# Patient Record
Sex: Female | Born: 1945 | Race: White | Hispanic: No | Smoking: Never smoker
Health system: Southern US, Community
[De-identification: ages and names within clinical notes are randomized; demographics above are authoritative.]

## PROBLEM LIST (undated history)

## (undated) DIAGNOSIS — R51 Headache: Secondary | ICD-10-CM

## (undated) DIAGNOSIS — Z8619 Personal history of other infectious and parasitic diseases: Secondary | ICD-10-CM

## (undated) DIAGNOSIS — E538 Deficiency of other specified B group vitamins: Secondary | ICD-10-CM

## (undated) DIAGNOSIS — E109 Type 1 diabetes mellitus without complications: Secondary | ICD-10-CM

## (undated) DIAGNOSIS — E039 Hypothyroidism, unspecified: Secondary | ICD-10-CM

## (undated) DIAGNOSIS — R569 Unspecified convulsions: Secondary | ICD-10-CM

## (undated) DIAGNOSIS — R519 Headache, unspecified: Secondary | ICD-10-CM

## (undated) DIAGNOSIS — D649 Anemia, unspecified: Secondary | ICD-10-CM

## (undated) HISTORY — DX: Unspecified convulsions: R56.9

## (undated) HISTORY — DX: Headache, unspecified: R51.9

## (undated) HISTORY — DX: Headache: R51

## (undated) HISTORY — DX: Anemia, unspecified: D64.9

## (undated) HISTORY — DX: Deficiency of other specified B group vitamins: E53.8

## (undated) HISTORY — DX: Type 1 diabetes mellitus without complications: E10.9

## (undated) HISTORY — DX: Personal history of other infectious and parasitic diseases: Z86.19

## (undated) HISTORY — DX: Hypothyroidism, unspecified: E03.9

---

## 1997-10-25 ENCOUNTER — Encounter: Admission: RE | Admit: 1997-10-25 | Discharge: 1998-01-23 | Payer: Self-pay | Admitting: Internal Medicine

## 2001-01-23 ENCOUNTER — Encounter: Admission: RE | Admit: 2001-01-23 | Discharge: 2001-04-23 | Payer: Self-pay | Admitting: Internal Medicine

## 2004-08-06 ENCOUNTER — Emergency Department (HOSPITAL_COMMUNITY): Admission: EM | Admit: 2004-08-06 | Discharge: 2004-08-06 | Payer: Self-pay | Admitting: Family Medicine

## 2005-03-10 ENCOUNTER — Ambulatory Visit: Payer: Self-pay | Admitting: Family Medicine

## 2005-04-21 ENCOUNTER — Ambulatory Visit: Payer: Self-pay | Admitting: Family Medicine

## 2005-04-23 ENCOUNTER — Encounter: Admission: RE | Admit: 2005-04-23 | Discharge: 2005-04-23 | Payer: Self-pay | Admitting: Family Medicine

## 2005-05-18 ENCOUNTER — Ambulatory Visit: Payer: Self-pay | Admitting: Gastroenterology

## 2005-05-18 ENCOUNTER — Ambulatory Visit (HOSPITAL_COMMUNITY): Admission: RE | Admit: 2005-05-18 | Discharge: 2005-05-18 | Payer: Self-pay | Admitting: Gastroenterology

## 2005-06-01 ENCOUNTER — Ambulatory Visit: Payer: Self-pay | Admitting: Family Medicine

## 2005-06-22 ENCOUNTER — Ambulatory Visit: Payer: Self-pay | Admitting: Gastroenterology

## 2005-06-23 ENCOUNTER — Ambulatory Visit: Payer: Self-pay | Admitting: Gastroenterology

## 2005-07-05 ENCOUNTER — Ambulatory Visit: Payer: Self-pay | Admitting: Cardiology

## 2005-07-13 ENCOUNTER — Encounter (INDEPENDENT_AMBULATORY_CARE_PROVIDER_SITE_OTHER): Payer: Self-pay | Admitting: *Deleted

## 2005-07-13 ENCOUNTER — Encounter: Payer: Self-pay | Admitting: Family Medicine

## 2005-07-13 ENCOUNTER — Ambulatory Visit: Payer: Self-pay | Admitting: Gastroenterology

## 2005-07-30 ENCOUNTER — Ambulatory Visit: Payer: Self-pay | Admitting: Family Medicine

## 2005-12-18 ENCOUNTER — Emergency Department (HOSPITAL_COMMUNITY): Admission: EM | Admit: 2005-12-18 | Discharge: 2005-12-18 | Payer: Self-pay | Admitting: Emergency Medicine

## 2005-12-19 ENCOUNTER — Ambulatory Visit: Payer: Self-pay | Admitting: Internal Medicine

## 2005-12-19 ENCOUNTER — Inpatient Hospital Stay (HOSPITAL_COMMUNITY): Admission: EM | Admit: 2005-12-19 | Discharge: 2005-12-24 | Payer: Self-pay | Admitting: Emergency Medicine

## 2005-12-20 ENCOUNTER — Encounter: Payer: Self-pay | Admitting: Internal Medicine

## 2006-03-10 ENCOUNTER — Ambulatory Visit: Payer: Self-pay | Admitting: Internal Medicine

## 2006-08-27 ENCOUNTER — Inpatient Hospital Stay (HOSPITAL_COMMUNITY): Admission: EM | Admit: 2006-08-27 | Discharge: 2006-09-01 | Payer: Self-pay | Admitting: Emergency Medicine

## 2006-08-29 ENCOUNTER — Ambulatory Visit: Payer: Self-pay | Admitting: Internal Medicine

## 2006-09-23 ENCOUNTER — Inpatient Hospital Stay (HOSPITAL_COMMUNITY): Admission: AD | Admit: 2006-09-23 | Discharge: 2006-09-26 | Payer: Self-pay | Admitting: Internal Medicine

## 2006-09-23 ENCOUNTER — Ambulatory Visit: Payer: Self-pay | Admitting: Internal Medicine

## 2007-03-16 ENCOUNTER — Telehealth: Payer: Self-pay | Admitting: *Deleted

## 2007-03-22 ENCOUNTER — Ambulatory Visit: Payer: Self-pay | Admitting: Internal Medicine

## 2007-03-22 DIAGNOSIS — E782 Mixed hyperlipidemia: Secondary | ICD-10-CM | POA: Insufficient documentation

## 2007-03-22 DIAGNOSIS — E039 Hypothyroidism, unspecified: Secondary | ICD-10-CM | POA: Insufficient documentation

## 2007-03-22 LAB — CONVERTED CEMR LAB: Blood Glucose, Fingerstick: 225

## 2007-06-13 ENCOUNTER — Telehealth: Payer: Self-pay | Admitting: Internal Medicine

## 2007-08-15 ENCOUNTER — Ambulatory Visit: Payer: Self-pay | Admitting: Internal Medicine

## 2007-08-15 LAB — CONVERTED CEMR LAB
Cholesterol: 175 mg/dL (ref 0–200)
HDL: 53.1 mg/dL (ref >39.0)
LDL Cholesterol: 102 mg/dL — ABNORMAL HIGH (ref 0–99)
TSH: 0.02 microintl units/mL — ABNORMAL LOW (ref 0.35–5.50)
Total CHOL/HDL Ratio: 3.3
Triglycerides: 102 mg/dL (ref 0–149)
VLDL: 20 mg/dL (ref 0–40)

## 2007-08-25 ENCOUNTER — Ambulatory Visit: Payer: Self-pay | Admitting: Internal Medicine

## 2007-08-31 ENCOUNTER — Encounter: Payer: Self-pay | Admitting: Endocrinology

## 2007-09-11 ENCOUNTER — Encounter: Admission: RE | Admit: 2007-09-11 | Discharge: 2007-12-10 | Payer: Self-pay | Admitting: Internal Medicine

## 2007-09-11 ENCOUNTER — Encounter: Payer: Self-pay | Admitting: Internal Medicine

## 2007-10-31 ENCOUNTER — Ambulatory Visit: Payer: Self-pay | Admitting: Internal Medicine

## 2007-11-01 ENCOUNTER — Telehealth: Payer: Self-pay | Admitting: Internal Medicine

## 2007-11-01 LAB — CONVERTED CEMR LAB
Creatinine,U: 77.8 mg/dL
Hgb A1c MFr Bld: 11.4 % — ABNORMAL HIGH (ref 4.6–6.0)
Microalb Creat Ratio: 5.1 mg/g (ref 0.0–30.0)
Microalb, Ur: 0.4 mg/dL (ref 0.0–1.9)
TSH: 1.31 microintl units/mL (ref 0.35–5.50)

## 2007-11-23 ENCOUNTER — Ambulatory Visit: Payer: Self-pay | Admitting: Endocrinology

## 2008-03-01 ENCOUNTER — Ambulatory Visit: Payer: Self-pay | Admitting: Internal Medicine

## 2008-03-01 DIAGNOSIS — J111 Influenza due to unidentified influenza virus with other respiratory manifestations: Secondary | ICD-10-CM | POA: Insufficient documentation

## 2008-03-08 ENCOUNTER — Telehealth: Payer: Self-pay | Admitting: *Deleted

## 2008-04-22 ENCOUNTER — Ambulatory Visit: Payer: Self-pay | Admitting: Internal Medicine

## 2008-04-22 ENCOUNTER — Inpatient Hospital Stay (HOSPITAL_COMMUNITY): Admission: EM | Admit: 2008-04-22 | Discharge: 2008-04-25 | Payer: Self-pay | Admitting: Emergency Medicine

## 2008-04-22 ENCOUNTER — Telehealth: Payer: Self-pay | Admitting: Internal Medicine

## 2008-04-25 ENCOUNTER — Encounter: Payer: Self-pay | Admitting: Internal Medicine

## 2008-05-07 ENCOUNTER — Telehealth: Payer: Self-pay | Admitting: *Deleted

## 2008-05-15 ENCOUNTER — Ambulatory Visit: Payer: Self-pay | Admitting: Internal Medicine

## 2008-05-15 DIAGNOSIS — K2990 Gastroduodenitis, unspecified, without bleeding: Secondary | ICD-10-CM

## 2008-05-15 DIAGNOSIS — K2970 Gastritis, unspecified, without bleeding: Secondary | ICD-10-CM | POA: Insufficient documentation

## 2008-05-15 LAB — CONVERTED CEMR LAB
Blood in Urine, dipstick: NEGATIVE
Glucose, Urine, Semiquant: 100
Ketones, urine, test strip: NEGATIVE
Nitrite: NEGATIVE
Protein, U semiquant: NEGATIVE
Specific Gravity, Urine: 1.015
Urobilinogen, UA: 0.2
pH: 5

## 2008-05-16 ENCOUNTER — Ambulatory Visit: Payer: Self-pay | Admitting: Internal Medicine

## 2008-05-17 ENCOUNTER — Encounter: Payer: Self-pay | Admitting: Internal Medicine

## 2008-09-10 ENCOUNTER — Telehealth: Payer: Self-pay | Admitting: *Deleted

## 2008-10-22 ENCOUNTER — Ambulatory Visit: Payer: Self-pay | Admitting: Internal Medicine

## 2008-10-22 LAB — CONVERTED CEMR LAB: Blood Glucose, Fingerstick: 408

## 2008-10-25 ENCOUNTER — Telehealth (INDEPENDENT_AMBULATORY_CARE_PROVIDER_SITE_OTHER): Payer: Self-pay | Admitting: *Deleted

## 2008-12-24 ENCOUNTER — Ambulatory Visit: Payer: Self-pay | Admitting: Internal Medicine

## 2008-12-27 LAB — CONVERTED CEMR LAB
ALT: 23 units/L (ref 0–35)
AST: 29 units/L (ref 0–37)
Albumin: 4 g/dL (ref 3.5–5.2)
Alkaline Phosphatase: 109 units/L (ref 39–117)
BUN: 17 mg/dL (ref 6–23)
Basophils Absolute: 0 10*3/uL (ref 0.0–0.1)
Basophils Relative: 0.4 % (ref 0.0–3.0)
Bilirubin, Direct: 0 mg/dL (ref 0.0–0.3)
CO2: 30 meq/L (ref 19–32)
Calcium: 9.7 mg/dL (ref 8.4–10.5)
Chloride: 98 meq/L (ref 96–112)
Cholesterol: 255 mg/dL — ABNORMAL HIGH (ref 0–200)
Creatinine, Ser: 0.9 mg/dL (ref 0.4–1.2)
Creatinine,U: 44.5 mg/dL
Direct LDL: 171.1 mg/dL
Eosinophils Absolute: 0.3 10*3/uL (ref 0.0–0.7)
Eosinophils Relative: 3.6 % (ref 0.0–5.0)
GFR calc non Af Amer: 67.46 mL/min (ref >60)
Glucose, Bld: 105 mg/dL — ABNORMAL HIGH (ref 70–99)
HCT: 39.5 % (ref 36.0–46.0)
HDL: 69.1 mg/dL (ref >39.00)
Hemoglobin: 13.6 g/dL (ref 12.0–15.0)
Hgb A1c MFr Bld: 11.5 % — ABNORMAL HIGH (ref 4.6–6.5)
Lymphocytes Relative: 40.1 % (ref 12.0–46.0)
Lymphs Abs: 2.8 10*3/uL (ref 0.7–4.0)
MCHC: 34.4 g/dL (ref 30.0–36.0)
MCV: 88.8 fL (ref 78.0–100.0)
Microalb Creat Ratio: 2.2 mg/g (ref 0.0–30.0)
Microalb, Ur: 0.1 mg/dL (ref 0.0–1.9)
Monocytes Absolute: 0.5 10*3/uL (ref 0.1–1.0)
Monocytes Relative: 6.9 % (ref 3.0–12.0)
Neutro Abs: 3.4 10*3/uL (ref 1.4–7.7)
Neutrophils Relative %: 49 % (ref 43.0–77.0)
Platelets: 215 10*3/uL (ref 150.0–400.0)
Potassium: 4.3 meq/L (ref 3.5–5.1)
RBC: 4.45 M/uL (ref 3.87–5.11)
RDW: 12.5 % (ref 11.5–14.6)
Sodium: 135 meq/L (ref 135–145)
TSH: 15.36 microintl units/mL — ABNORMAL HIGH (ref 0.35–5.50)
Total Bilirubin: 1 mg/dL (ref 0.3–1.2)
Total CHOL/HDL Ratio: 4
Total Protein: 7.4 g/dL (ref 6.0–8.3)
Triglycerides: 73 mg/dL (ref 0.0–149.0)
VLDL: 14.6 mg/dL (ref 0.0–40.0)
WBC: 7 10*3/uL (ref 4.5–10.5)

## 2008-12-31 ENCOUNTER — Ambulatory Visit: Payer: Self-pay | Admitting: Internal Medicine

## 2009-02-07 ENCOUNTER — Ambulatory Visit: Payer: Self-pay | Admitting: Endocrinology

## 2009-02-20 ENCOUNTER — Encounter: Payer: Self-pay | Admitting: Internal Medicine

## 2009-02-21 ENCOUNTER — Ambulatory Visit: Payer: Self-pay | Admitting: Endocrinology

## 2009-02-21 LAB — CONVERTED CEMR LAB
BUN: 15 mg/dL (ref 6–23)
CO2: 31 meq/L (ref 19–32)
Chloride: 100 meq/L (ref 96–112)
GFR calc non Af Amer: 77.24 mL/min (ref >60)
Glucose, Bld: 147 mg/dL — ABNORMAL HIGH (ref 70–99)
Potassium: 4.7 meq/L (ref 3.5–5.1)
Sodium: 138 meq/L (ref 135–145)

## 2009-03-04 ENCOUNTER — Ambulatory Visit: Payer: Self-pay | Admitting: Internal Medicine

## 2009-03-07 ENCOUNTER — Telehealth (INDEPENDENT_AMBULATORY_CARE_PROVIDER_SITE_OTHER): Payer: Self-pay | Admitting: *Deleted

## 2009-03-08 ENCOUNTER — Encounter: Payer: Self-pay | Admitting: Endocrinology

## 2009-03-10 ENCOUNTER — Encounter: Payer: Self-pay | Admitting: Endocrinology

## 2009-03-10 LAB — CONVERTED CEMR LAB
Cholesterol: 235 mg/dL — ABNORMAL HIGH (ref 0–200)
HDL: 54.2 mg/dL (ref >39.00)
Total CHOL/HDL Ratio: 4
Triglycerides: 94 mg/dL (ref 0.0–149.0)
VLDL: 18.8 mg/dL (ref 0.0–40.0)

## 2009-03-17 ENCOUNTER — Encounter: Payer: Self-pay | Admitting: Endocrinology

## 2009-04-03 ENCOUNTER — Telehealth: Payer: Self-pay | Admitting: Endocrinology

## 2009-04-04 ENCOUNTER — Encounter: Payer: Self-pay | Admitting: Endocrinology

## 2009-04-28 ENCOUNTER — Encounter: Payer: Self-pay | Admitting: Endocrinology

## 2009-05-01 ENCOUNTER — Ambulatory Visit: Payer: Self-pay | Admitting: Endocrinology

## 2009-05-09 ENCOUNTER — Ambulatory Visit: Payer: Self-pay | Admitting: Endocrinology

## 2009-08-12 ENCOUNTER — Ambulatory Visit: Payer: Self-pay | Admitting: Internal Medicine

## 2009-08-12 DIAGNOSIS — B9789 Other viral agents as the cause of diseases classified elsewhere: Secondary | ICD-10-CM

## 2009-08-12 DIAGNOSIS — R112 Nausea with vomiting, unspecified: Secondary | ICD-10-CM

## 2009-08-12 LAB — CONVERTED CEMR LAB
Blood in Urine, dipstick: NEGATIVE
Urobilinogen, UA: 0.2
pH: 5.5

## 2009-08-13 ENCOUNTER — Telehealth: Payer: Self-pay | Admitting: *Deleted

## 2009-08-13 LAB — CONVERTED CEMR LAB
Albumin: 3.9 g/dL (ref 3.5–5.2)
Basophils Relative: 0.7 % (ref 0.0–3.0)
CO2: 29 meq/L (ref 19–32)
Chloride: 107 meq/L (ref 96–112)
Creatinine, Ser: 0.8 mg/dL (ref 0.4–1.2)
Eosinophils Absolute: 0.2 10*3/uL (ref 0.0–0.7)
Glucose, Bld: 114 mg/dL — ABNORMAL HIGH (ref 70–99)
Hemoglobin: 12.7 g/dL (ref 12.0–15.0)
MCHC: 33.1 g/dL (ref 30.0–36.0)
MCV: 87.2 fL (ref 78.0–100.0)
Monocytes Absolute: 0.5 10*3/uL (ref 0.1–1.0)
Neutro Abs: 2.1 10*3/uL (ref 1.4–7.7)
RBC: 4.41 M/uL (ref 3.87–5.11)
Total CK: 152 units/L (ref 7–177)
Total Protein: 7.2 g/dL (ref 6.0–8.3)

## 2009-08-14 ENCOUNTER — Telehealth: Payer: Self-pay | Admitting: Family Medicine

## 2009-08-14 ENCOUNTER — Ambulatory Visit: Payer: Self-pay | Admitting: Internal Medicine

## 2009-08-15 ENCOUNTER — Encounter: Admission: RE | Admit: 2009-08-15 | Discharge: 2009-08-15 | Payer: Self-pay | Admitting: Internal Medicine

## 2009-08-15 ENCOUNTER — Encounter: Payer: Self-pay | Admitting: Internal Medicine

## 2009-08-18 ENCOUNTER — Telehealth: Payer: Self-pay | Admitting: *Deleted

## 2009-08-26 ENCOUNTER — Encounter: Payer: Self-pay | Admitting: Internal Medicine

## 2009-10-13 ENCOUNTER — Ambulatory Visit: Payer: Self-pay | Admitting: Endocrinology

## 2010-01-02 ENCOUNTER — Emergency Department (HOSPITAL_COMMUNITY): Admission: EM | Admit: 2010-01-02 | Discharge: 2010-01-02 | Payer: Self-pay | Admitting: Emergency Medicine

## 2010-01-02 ENCOUNTER — Ambulatory Visit: Payer: Self-pay | Admitting: Internal Medicine

## 2010-01-02 DIAGNOSIS — E1110 Type 2 diabetes mellitus with ketoacidosis without coma: Secondary | ICD-10-CM

## 2010-01-02 DIAGNOSIS — M7989 Other specified soft tissue disorders: Secondary | ICD-10-CM

## 2010-01-02 DIAGNOSIS — IMO0001 Reserved for inherently not codable concepts without codable children: Secondary | ICD-10-CM

## 2010-01-02 LAB — CONVERTED CEMR LAB
Blood Glucose, AC Bkfst: 335 mg/dL
Glucose, Urine, Semiquant: >=1000
Specific Gravity, Urine: 1.015
WBC Urine, dipstick: NEGATIVE
pH: 5

## 2010-01-03 ENCOUNTER — Encounter: Payer: Self-pay | Admitting: Internal Medicine

## 2010-01-03 ENCOUNTER — Inpatient Hospital Stay (HOSPITAL_COMMUNITY): Admission: EM | Admit: 2010-01-03 | Discharge: 2010-01-07 | Payer: Self-pay | Admitting: Emergency Medicine

## 2010-01-08 ENCOUNTER — Telehealth: Payer: Self-pay | Admitting: *Deleted

## 2010-03-31 ENCOUNTER — Ambulatory Visit (HOSPITAL_COMMUNITY)
Admission: RE | Admit: 2010-03-31 | Discharge: 2010-03-31 | Payer: Self-pay | Source: Home / Self Care | Admitting: Internal Medicine

## 2010-05-01 ENCOUNTER — Telehealth (INDEPENDENT_AMBULATORY_CARE_PROVIDER_SITE_OTHER): Payer: Self-pay | Admitting: *Deleted

## 2010-06-03 ENCOUNTER — Encounter: Payer: Self-pay | Admitting: Endocrinology

## 2010-07-02 NOTE — Progress Notes (Signed)
  Phone Note Other Incoming   Request: Send information Summary of Call: Request received from [facility] forwarded to [facility].  Marland Kitchen

## 2010-07-02 NOTE — Progress Notes (Signed)
  Phone Note From Other Clinic   Caller: [place] Summary of Call: Call from [facility] CT.  Pt has recent head trauma.  1cm ring density L cerebellum with DDX contusion vs mass vs hemorrhage.  Spoke with patient and no further nausea or vomiting.  Headaches are relatively mild and intermittent.  No confusion or seizure.  MRI recommended for further assessement with contrast.  Pt is instructed to hold ASA until further assessed.  She does still have some dizziness but no progressive symptoms.  Pt knows to f/u immediately or go to ER if she has any progressive headache, vomiting, speech difficullty, seizure, confusion, or any new focal neurologic symptoms. Initial call taken by: Evelena Peat MD,  August 14, 2009 5:43 PM     Appended Document:  MRI head  to be scheduled.

## 2010-07-02 NOTE — Progress Notes (Signed)
Summary: Dizzy  Phone Note Call from Patient Call back at Home Phone [phone]   Caller: Patient Summary of Call: Pt states she is real dizzy from hitting her head. Other than that she is fine. Initial call taken by: Romualdo Bolk, CMA (AAMA),  August 13, 2009 9:42 AM  Follow-up for Phone Call        if not improving  would do Ct scan of head .  Follow-up by: Madelin Headings MD,  August 13, 2009 9:44 AM  Additional Follow-up for Phone Call Additional follow up Details #1::        Pt aware and wants to go ahead with CT. Order sent to [facility]. Additional Follow-up by: Romualdo Bolk, CMA (AAMA),  August 13, 2009 11:48 AM

## 2010-07-02 NOTE — Letter (Signed)
Summary: [facility]   Imported By: Maryln Gottron 01/20/2010 14:41:35  _____________________________________________________________________  External Attachment:    Type:   Image     Comment:   External Document

## 2010-07-02 NOTE — Assessment & Plan Note (Signed)
Summary: FU---STC   Vital Signs:  Patient profile:   65 year old female Menstrual status:  postmenopausal Height:      62 inches (157.48 cm) Weight:      141.38 pounds (64.26 kg) O2 Sat:      98 % on Room air Temp:     98.1 degrees F (36.72 degrees C) oral Pulse rate:   83 / minute BP sitting:   110 / 70  (left arm) Cuff size:   regular  Vitals Entered By: Josph Macho RMA (Oct 13, 2009 1:18 PM)  O2 Flow:  Room air CC: Follow-up visit/ CF Is Patient Diabetic? Yes   Referring Provider:  Dr Berniece Andreas Primary Provider:  Dr Berniece Andreas  CC:  Follow-up visit/ CF.  History of Present Illness: pt states she feels well in general.  she has resumed her pump therapy x 1 month.  she has frequent hypoglgycemia before breakfast.  it is highest after lunch (200's).  she averages approx 30 units humalog per day, via her pump. she was seen in er for head injury after she fell out of bed.   Current Medications (verified): 1)  Onetouch Test   Strp (Glucose Blood) .... Use Qid 2)  Novolog 100 Unit/ml Soln (Insulin Aspart) .... For Use in Insulin Pump, Total of 45 Units Per Day 3)  Bd Ultra-Fine Pen Needles 29g X 12.17mm  Misc (Insulin Pen Needle) .... Use As Directed 4)  Baby Aspirin 81 Mg  Chew (Aspirin) .... Once Daily 5)  Imodium Advanced 2-125 Mg Tabs (Loperamide-Simethicone) 6)  Levothroid 137 Mcg Tabs (Levothyroxine Sodium) .Marland Kitchen.. 1 By Mouth Once Daily 7)  Simvastatin 80 Mg Tabs (Simvastatin) .... 1/2 Qhs 8)  Ondansetron Hcl 4 Mg Tabs (Ondansetron Hcl) .Marland Kitchen.. 1 By Mouth Q4-6 Hours As Needed Nausea and Vomiting  Allergies (verified): No Known Drug Allergies  Past History:  Past Medical History: Last updated: 05/15/2008 Diabetes mellitus, type   dx in her 40's Hypothyroidism  g4p3 Hosp NVD ?uti DM  Fever  Dr Jennette Kettle is GYNE    Review of Systems       other than the above, no syncope.  Physical Exam  General:  normal appearance.   Pulses:  dorsalis pedis intact bilat.      Extremities:  no deformity.  no ulcer on the feet.  feet are of normal color and temp.  no edema  Neurologic:  sensation is intact to touch on the feet    Impression & Recommendations:  Problem # 1:  DIABETES MELLITUS, TYPE I (ICD-250.01) she needs some adjustment in her therapy  Other Orders: T-Fructosamine (16109-60454) Diabetic Clinic Referral (Diabetic) Est. Patient Level III (09811)  Patient Instructions: 1)  tests are being ordered for you today.  a few days after the test(s), please call [phone] to hear your test results. 2)  pending the test results, please: 3)  reduce basal rate to 0.3 units/hr. 4)  increase mealtime bolus to 1 unit/ 10 grams carbohydrate 5)  continue correction bolus (which some people call "sensitivity," or "insulin sensitivity ratio," or just "isr") to 1 unit for each 100 by which your glucose exceeds 100. 6)  Please schedule a follow-up appointment in 1 month.   7)  please see linda to consider continuous glucose monitor.  you will be called with a day and time for an appointment.

## 2010-07-02 NOTE — Assessment & Plan Note (Signed)
Summary: ? VIRUS//CCM   Vital Signs:  Patient profile:   65 year old female Menstrual status:  postmenopausal Weight:      141 pounds O2 Sat:      95 % on Room air Temp:     98.1 degrees F oral Pulse rate:   88 / minute BP sitting:   120 / 80  (right arm) Cuff size:   regular  Vitals Entered By: Romualdo Bolk, CMA (AAMA) (August 12, 2009 11:26 AM)  O2 Flow:  Room air CC: Started 3/14 with chills and sweats, vomiting, nausea, nasal congestion, coughing a few days ago. Pt fell out of bed last night but didn't realize it until this am. She took nyquil last night. No SOB or wheezing. Pt has been taking 1 tab of simvastatin 80mg  CBG Result 136   History of Present Illness: [name]  comesin for acute visit today for above.  onset  when  arising yesterday am  felt fatigue and nausea and mild cough and stayed home and took medication.     Took dayquil tablets and last  night nyquil l  awake   3 am   on the floor  and was shaking from cold.   Got back to bed and has a mild ha this am without vision changes  No documented fever.    vomited this am . Is  thirsty.   but sugars are ok recently.  no diarrhea. no recent  unexplained hypoglycemia .   On insulin  pump    no change   in  readings.   Preventive Screening-Counseling & Management  Alcohol-Tobacco     Alcohol drinks/day: 1     Alcohol type: wine     Smoking Status: never  Caffeine-Diet-Exercise     Caffeine use/day: 2     Does Patient Exercise: yes  Current Medications (verified): 1)  Onetouch Test   Strp (Glucose Blood) .... Use Qid 2)  Novolog 100 Unit/ml Soln (Insulin Aspart) .... For Use in Insulin Pump, Total of 45 Units Per Day 3)  Bd Ultra-Fine Pen Needles 29g X 12.76mm  Misc (Insulin Pen Needle) .... Use As Directed 4)  Baby Aspirin 81 Mg  Chew (Aspirin) .... Once Daily 5)  Imodium Advanced 2-125 Mg Tabs (Loperamide-Simethicone) 6)  Levothroid 137 Mcg Tabs (Levothyroxine Sodium) .Marland Kitchen.. 1 By Mouth Once Daily 7)   Simvastatin 80 Mg Tabs (Simvastatin) .... 1/2 Qhs  Allergies (verified): No Known Drug Allergies  Past History:  Past medical, surgical, family and social histories (including risk factors) reviewed, and no changes noted (except as noted below).  Past Medical History: Reviewed history from 05/15/2008 and no changes required. Diabetes mellitus, type   dx in her 23's Hypothyroidism  g4p3 Hosp NVD ?uti DM  Fever  Dr Jennette Kettle is GYNE    Past Surgical History: Reviewed history from 08/25/2007 and no changes required. csec  Past History:  Care Management: Endocrinology:[facility] Gynecology: Jennette Kettle  Family History: Reviewed history from 05/15/2008 and no changes required. see old chart Neg DM POs for stroke and HT     Social History: Reviewed history from 05/09/2009 and no changes required. 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  The patient denies fever, vision loss, decreased hearing, hoarseness, chest pain,  syncope, dyspnea on exertion, peripheral edema, prolonged cough, headaches, hemoptysis, abdominal pain, melena, hematochezia, severe indigestion/heartburn, hematuria, transient blindness, depression, unusual weight change, abnormal bleeding, enlarged lymph nodes, and angioedema.         feels weak all over but no focal  weakness  Physical Exam  General:  tired tlooking in nad  hard to get on exam table but otherwise nl gait and station. looks washed out. Head:  normocephalic.  bruise on right frontal area mildy elevated no deformity otherwise   Eyes:  vision grossly intact and pupils round.  eoms nl  Ears:  R ear normal and L ear normal.   Nose:  no external deformity, no external erythema, and no nasal discharge.  mild congestion Mouth:  pharynx pink and moist.   Neck:  No deformities, masses, or tenderness  noted. Lungs:  Normal respiratory effort, chest expands symmetrically. Lungs are clear to auscultation, no crackles or wheezes.no dullness.  no dullness.   Heart:  Normal rate and regular rhythm. S1 and S2 normal without gallop, murmur, click, rub or other extra sounds.no lifts.  no lifts.   Abdomen:  Bowel sounds positive,abdomen soft and non-tender without masses, organomegaly or   noted. Pulses:  nl cap refill  Extremities:  no clubbing cyanosis or edema  Neurologic:  alert & oriented X3, cranial nerves II-XII intact, strength normal in all extremities, and gait normal.  looks tired  Skin:  turgor normal, color normal, no petechiae, and no purpura.  bruise on frontal area  Cervical Nodes:  No lymphadenopathy noted   Impression & Recommendations:  Problem # 1:  NAUSEA AND VOMITING (ICD-787.01) seems viral by onset and   context  but high risk and hit head last night when fell out of bed.   labs and ua and cpk   mild dehydration  Orders: UA Dipstick w/o Micro (automated)  (81003) Venipuncture (81191) TLB-BMP (Basic Metabolic Panel-BMET) (80048-METABOL) TLB-CBC Platelet - w/Differential (85025-CBCD) TLB-Hepatic/Liver Function Pnl (80076-HEPATIC) TLB-CK Total Only(Creatine Kinase/CPK) (82550-CK)  Problem # 2:  HEAD TRAUMA, CLOSED MINOR (ICD-959.01) fell off bed   poss from medication   cannot tell if any concussion but non focal exam and mentating well   Problem # 3:  DIABETES MELLITUS, TYPE I (ICD-250.01) had been doing better   and no recen lows or very highs . Her updated medication list for this problem includes:    Novolog 100 Unit/ml Soln (Insulin aspart) .Marland Kitchen... For use in insulin pump, total of 45 units per day    Baby Aspirin 81 Mg Chew (Aspirin) ..... Once daily  Problem # 4:  HYPERLIPIDEMIA (ICD-272.2) taking high dose    vs 40  will discuss later    .  Her updated medication list for this problem includes:    Simvastatin 80 Mg Tabs (Simvastatin) .Marland Kitchen... 1/2 qhs  Problem #  5:  VIRAL INFECTION (ICD-079.99) most likely by context .    Her updated medication list for this problem includes:    Baby Aspirin 81 Mg Chew (Aspirin) ..... Once daily  Complete Medication List: 1)  Onetouch Test Strp (Glucose blood) .... Use qid 2)  Novolog 100 Unit/ml Soln (Insulin aspart) .... For use in insulin pump, total of 45 units per day 3)  Bd Ultra-fine Pen Needles 29g X 12.82mm Misc (Insulin pen needle) .... Use as directed 4)  Baby Aspirin 81 Mg Chew (Aspirin) .... Once daily 5)  Imodium Advanced 2-125 Mg Tabs (Loperamide-simethicone) 6)  Levothroid 137 Mcg Tabs (Levothyroxine sodium) .Marland KitchenMarland KitchenMarland Kitchen  1 by mouth once daily 7)  Simvastatin 80 Mg Tabs (Simvastatin) .... 1/2 qhs 8)  Ondansetron Hcl 4 Mg Tabs (Ondansetron hcl) .Marland Kitchen.. 1 by mouth q4-6 hours as needed nausea and vomiting  Other Orders: Capillary Blood Glucose/CBG (04540)  Patient Instructions: 1)  You will be informed of lab results when available.  2)  No nyquill or dayquil  3)  tylenol for chills   4)  med for  vomiting.   5)  If HA or vomiting is persistent and progressive  then seek emergent care . 6)  Oral rehydration solution: Drink 1/2 ounce every 15 minutes. If tolerated after 1 hour, drink 1 ounce every 15 minutes. As you  can tolerate, keep adding 1/2 ounce every 15 minutes, up to a total or 2-4 ounces. Contact the office if unable to tolerate oral solution, if you keep vomiting, or you continue to have signs of dehydration. Prescriptions: ONDANSETRON HCL 4 MG TABS (ONDANSETRON HCL) 1 by mouth q4-6 hours as needed nausea and vomiting  #15 x 0   Entered and Authorized by:   Madelin Headings MD   Signed by:   Madelin Headings MD on 08/12/2009   Method used:   Electronically to        [id number]* (retail)       [address]       [place], [place]  [address]       Ph: [phone]       Fax: [phone]   RxID:   [id number]   Laboratory Results   Urine Tests    Routine Urinalysis   Color:  yellow Appearance: Clear Glucose: trace   (Normal Range: Negative) Bilirubin: 2+   (Normal Range: Negative) Ketone: 1+   (Normal Range: Negative) Spec. Gravity: 1.020   (Normal Range: 1.003-1.035) Blood: negative   (Normal Range: Negative) pH: 5.5   (Normal Range: 5.0-8.0) Protein: trace   (Normal Range: Negative) Urobilinogen: 0.2   (Normal Range: 0-1) Nitrite: negative   (Normal Range: Negative) Leukocyte Esterace: trace   (Normal Range: Negative)    Comments: Rita Ohara  August 12, 2009 1:27 PM   Blood Tests     CBG Random:: 136mg /dL

## 2010-07-02 NOTE — Consult Note (Signed)
Summary: [facility] : cavernous malform left cerebellum  [facility]   Imported By: Maryln Gottron 10/15/2009 10:53:50  _____________________________________________________________________  External Attachment:    Type:   Image     Comment:   External Document

## 2010-07-02 NOTE — Miscellaneous (Signed)
Summary: Orders Update   Clinical Lists Changes  Orders: Added new Referral order of Radiology Referral (Radiology) - Signed 

## 2010-07-02 NOTE — Assessment & Plan Note (Signed)
Summary: body pain//ccm   Vital Signs:  Patient profile:   65 year old female Menstrual status:  postmenopausal Height:      62 inches Weight:      138 pounds BMI:     25.33 Temp:     98.5 degrees F oral Pulse rate:   66 / minute BP sitting:   100 / 60  (right arm) Cuff size:   regular  Vitals Entered By: Romualdo Bolk, CMA (AAMA) (January 02, 2010 11:40 AM) CC: Body pain all over. Hands Swollen. Pt states that it hurts to move anything. No fever, no congestion but a little coughing. This just started on 8/4. Pt states that the pain is everywhere. Pt can't stand up or lift legs. Pain is in the joints and muscles. Pt states that she can't use her hands.   History of Present Illness: [name] comes in today  for  acute work in visit for above . with family members for above . Sub acute onset of hurting all over with muschels and joints over the last 24 hours . (Less than)   was to change her pump but her hands were so swollen this am she couldnt   put her pump back on.. She states it was in reasonable control   butil this happened. No fever  or change in meds .    Novomiting until in the office .   BG was in 300s    last pm and given some of her insulin 3 cc   .   No diarrhea uti signs no sob. Some HA  and severe back pain also. No rash or  bug bites.  has some rhinorrhea with this illness . severe  body aches   and difficulty with mobility.    Cant walk well and cant grip things because hands area swollen.    She sees Dr Everardo All for her diabetes and is due for a check   Preventive Screening-Counseling & Management  Alcohol-Tobacco     Alcohol drinks/day: 1     Alcohol type: wine     Smoking Status: never  Caffeine-Diet-Exercise     Caffeine use/day: 2     Does Patient Exercise: yes  Current Medications (verified): 1)  Onetouch Test   Strp (Glucose Blood) .... Use Qid 2)  Novolog 100 Unit/ml Soln (Insulin Aspart) .... For Use in Insulin Pump, Total of 45 Units Per  Day 3)  Bd Ultra-Fine Pen Needles 29g X 12.31mm  Misc (Insulin Pen Needle) .... Use As Directed 4)  Baby Aspirin 81 Mg  Chew (Aspirin) .... Once Daily 5)  Imodium Advanced 2-125 Mg Tabs (Loperamide-Simethicone) 6)  Levothroid 137 Mcg Tabs (Levothyroxine Sodium) .Marland Kitchen.. 1 By Mouth Once Daily 7)  Simvastatin 80 Mg Tabs (Simvastatin) .... 1/2 Qhs 8)  Ondansetron Hcl 4 Mg Tabs (Ondansetron Hcl) .Marland Kitchen.. 1 By Mouth Q4-6 Hours As Needed Nausea and Vomiting  Allergies (verified): No Known Drug Allergies  Past History:  Past medical, surgical, family and social histories (including risk factors) reviewed, and no changes noted (except as noted below).  Past Medical History: Reviewed history from 05/15/2008 and no changes required. Diabetes mellitus, type   dx in her 63's Hypothyroidism  g4p3 Hosp NVD ?uti DM  Fever  Dr Jennette Kettle is GYNE    Past Surgical History: Reviewed history from 08/25/2007 and no changes required. csec  Past History:  Care Management: Endocrinology:[facility] Gynecology: Jennette Kettle  Family History: Reviewed history from 05/15/2008 and no changes  required. see old chart Neg DM POs for stroke and HT     Social History: Reviewed history from 05/09/2009 and no changes required. 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  The patient denies fever, weight loss, weight gain, vision loss, decreased hearing, hoarseness, chest pain, syncope, dyspnea on exertion, peripheral edema, prolonged cough, hemoptysis, abdominal pain, melena, hematochezia, severe indigestion/heartburn, hematuria, incontinence, genital sores, transient blindness, depression, abnormal bleeding, enlarged lymph nodes, and angioedema.         rest of ros negative  or [place]   Physical Exam  General:  acutely  ill in mod distress  with difficulty walking but coherent  . and alert  Head:  normocephalic, atraumatic, and no abnormalities observed.   Eyes:  PERRL, EOMs full, conjunctiva clear   Ears:  R ear normal, L ear normal, and no external deformities.   Nose:  no external deformity.  clear discharge  Mouth:  pharynx pink and moist.   Neck:  No deformities, masses, or tenderness noted. Lungs:  Normal respiratory effort, chest expands symmetrically. Lungs are clear to auscultation, no crackles or wheezes. Heart:  Normal rate and regular rhythm. S1 and S2 normal without gallop, murmur, click, rub or other extra sounds. Abdomen:  Bowel sounds positive,abdomen soft and non-tender without masses, organomegaly or   noted.  bs nl  Msk:  hands swollein with  digits  (finger )swelling  but no redenss  stiffness   no other joint swelling.   moves slowly and hurt to touch    Pulses:  nl cap refill  Extremities:  no le swelling Neurologic:  oriented   antalgic gait and needs help to walk  Skin:  turgor normal, color normal, no petechiae, and no purpura.   Cervical Nodes:  No lymphadenopathy noted Psych:  Oriented X3 and good eye contact.   ill apperating    Impression & Recommendations:  Problem # 1:  MYALGIA (ICD-729.1) severe a nd rather acute onset   almost flu like         has been onstatinf for a while but would stop untill assessed  Her updated medication list for this problem includes:    Baby Aspirin 81 Mg Chew (Aspirin) ..... 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 needs iv fluids hydration and lab assessment Her updated medication list for this problem includes:    Novolog 100 Unit/ml Soln (Insulin aspart) .Marland Kitchen... For use in insulin pump, total of 45 units per day    Baby  Aspirin 81 Mg Chew (Aspirin) ..... Once daily  Problem # 4:  HYPOTHYROIDISM (ICD-244.9)  Her updated medication list for this problem includes:    Levothroid 137 Mcg Tabs (Levothyroxine sodium) .Marland Kitchen... 1 by mouth once daily  Labs Reviewed: TSH: 0.66 (03/04/2009)    HgBA1c: 11.5 (12/27/2008) Chol: 235 (03/04/2009)   HDL: 54.20 (03/04/2009)   LDL: 102 (08/15/2007)   TG: 94.0 (03/04/2009)  Complete Medication List: 1)  Onetouch Test  Strp (Glucose blood) .... Use qid 2)  Novolog 100 Unit/ml Soln (Insulin aspart) .... For use in insulin pump, total of 45 units per day 3)  Bd Ultra-fine Pen Needles 29g X 12.6mm Misc (Insulin pen needle) .... Use as directed 4)  Baby Aspirin 81 Mg Chew (Aspirin) .... Once daily 5)  Imodium Advanced 2-125 Mg Tabs (Loperamide-simethicone) 6)  Levothroid 137 Mcg Tabs (Levothyroxine sodium) .Marland Kitchen.. 1 by mouth once daily 7)  Simvastatin 80 Mg Tabs (Simvastatin) .... 1/2 qhs 8)  Ondansetron Hcl 4 Mg Tabs (Ondansetron hcl) .Marland Kitchen.. 1 by mouth q4-6 hours as needed nausea and vomiting  Patient Instructions: 1)  To the emergency room for evaluation   for youur pain and swelling hydration and insulin management.  Laboratory Results   Urine Tests    Routine Urinalysis   Color: yellow Appearance: Clear Glucose: >=1000   (Normal Range: Negative) Bilirubin: negative   (Normal Range: Negative) Ketone: moderate (40)   (Normal Range: Negative) Spec. Gravity: 1.015   (Normal Range: 1.003-1.035) Blood: negative   (Normal Range: Negative) pH: 5.0   (Normal Range: 5.0-8.0) Protein: negative   (Normal Range: Negative) Urobilinogen: 0.2   (Normal Range: 0-1) Nitrite: negative   (Normal Range: Negative) Leukocyte Esterace: negative   (Normal Range: Negative)    Comments: Romualdo Bolk, CMA (AAMA)  January 02, 2010 12:03 PM   Blood Tests     CBG Fasting:: 335mg /dL

## 2010-07-02 NOTE — Progress Notes (Signed)
Summary: MRI results  Phone Note Call from Patient Call back at Home Phone [phone]   Caller: Patient Summary of Call: Pt called wanting results of her MRI.  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  No tumor or elevated pressure I would like her to see a Retail buyer.  Please refer with copy of her scan. Also assess how she is doing.  Follow-up by: Madelin Headings MD,  August 18, 2009 5:01 PM  Additional Follow-up for Phone Call Additional follow up Details #1::        Pt aware of results and order sent to [facility]. Additional Follow-up by: Romualdo Bolk, CMA (AAMA),  August 18, 2009 5:25 PM

## 2010-07-02 NOTE — Progress Notes (Signed)
Summary: Needs a note saying that she can go back to work  Phone Note Call from Patient Call back at [employer] [phone]   Caller: Patient Summary of Call: Pt needs a note saying that she can go back to work. Pt was admitted to the Hospital on 01/03/10 and discharged on 01/07/10. Pt states that she needs to go back to work because she just started a new job and has no sick days.  Initial call taken by: Romualdo Bolk, CMA Duncan Dull),  January 08, 2010 3:19 PM  Follow-up for Phone Call        Pt called to check on status of letter. Pt would like to be notified when letter is ready for pick up.  [phone] Follow-up by: Lucy Antigua,  January 09, 2010 9:56 AM  Additional Follow-up for Phone Call Additional follow up Details #1::        I cannot do this for someone I have never met. She needs to be seen here by someone to clear her for work  Additional Follow-up by: Nelwyn Salisbury MD,  January 09, 2010 1:22 PM    Additional Follow-up for Phone Call Additional follow up Details #2::    I called pt and informed them of info noted above. Pt said, thank you, and not to worry about it.    Follow-up by: Lucy Antigua,  January 09, 2010 1:44 PM  Additional Follow-up for Phone Call Additional follow up Details #3:: Details for Additional Follow-up Action Taken: Pt has gotten a note to go back to work from Dr. Betti Cruz.  Additional Follow-up by: Romualdo Bolk, CMA (AAMA),  January 13, 2010 9:52 AM

## 2010-07-02 NOTE — Letter (Signed)
Summary: [facility] [employer]  [employer]   Imported By: Sherian Rein 06/12/2010 11:42:07  _____________________________________________________________________  External Attachment:    Type:   Image     Comment:   External Document

## 2010-08-14 LAB — COMPREHENSIVE METABOLIC PANEL
ALT: 16 U/L (ref 0–35)
ALT: 22 U/L (ref 0–35)
AST: 21 U/L (ref 0–37)
AST: 23 U/L (ref 0–37)
Albumin: 2.4 g/dL — ABNORMAL LOW (ref 3.5–5.2)
BUN: 22 mg/dL (ref 6–23)
BUN: <1 mg/dL — ABNORMAL LOW (ref 6–23)
CO2: 15 mEq/L — ABNORMAL LOW (ref 19–32)
CO2: 18 mEq/L — ABNORMAL LOW (ref 19–32)
CO2: 19 mEq/L (ref 19–32)
CO2: 24 mEq/L (ref 19–32)
Calcium: 8.1 mg/dL — ABNORMAL LOW (ref 8.4–10.5)
Calcium: 8.4 mg/dL (ref 8.4–10.5)
Calcium: 9.3 mg/dL (ref 8.4–10.5)
Chloride: 110 mEq/L (ref 96–112)
Chloride: 113 mEq/L — ABNORMAL HIGH (ref 96–112)
Chloride: 115 mEq/L — ABNORMAL HIGH (ref 96–112)
Creatinine, Ser: 0.68 mg/dL (ref 0.4–1.2)
Creatinine, Ser: 0.73 mg/dL (ref 0.4–1.2)
Creatinine, Ser: 0.96 mg/dL (ref 0.4–1.2)
Creatinine, Ser: 1.13 mg/dL (ref 0.4–1.2)
GFR calc Af Amer: 59 mL/min — ABNORMAL LOW (ref >60)
GFR calc Af Amer: >60 mL/min (ref >60)
GFR calc non Af Amer: 49 mL/min — ABNORMAL LOW (ref >60)
GFR calc non Af Amer: 59 mL/min — ABNORMAL LOW (ref >60)
GFR calc non Af Amer: >60 mL/min (ref >60)
GFR calc non Af Amer: >60 mL/min (ref >60)
Glucose, Bld: 131 mg/dL — ABNORMAL HIGH (ref 70–99)
Glucose, Bld: 253 mg/dL — ABNORMAL HIGH (ref 70–99)
Glucose, Bld: 501 mg/dL — ABNORMAL HIGH (ref 70–99)
Sodium: 133 mEq/L — ABNORMAL LOW (ref 135–145)
Sodium: 139 mEq/L (ref 135–145)
Total Bilirubin: 0.7 mg/dL (ref 0.3–1.2)
Total Bilirubin: 0.7 mg/dL (ref 0.3–1.2)
Total Bilirubin: 0.8 mg/dL (ref 0.3–1.2)
Total Protein: 6.9 g/dL (ref 6.0–8.3)

## 2010-08-14 LAB — GLUCOSE, CAPILLARY
Glucose-Capillary: 101 mg/dL — ABNORMAL HIGH (ref 70–99)
Glucose-Capillary: 117 mg/dL — ABNORMAL HIGH (ref 70–99)
Glucose-Capillary: 125 mg/dL — ABNORMAL HIGH (ref 70–99)
Glucose-Capillary: 135 mg/dL — ABNORMAL HIGH (ref 70–99)
Glucose-Capillary: 140 mg/dL — ABNORMAL HIGH (ref 70–99)
Glucose-Capillary: 154 mg/dL — ABNORMAL HIGH (ref 70–99)
Glucose-Capillary: 193 mg/dL — ABNORMAL HIGH (ref 70–99)
Glucose-Capillary: 207 mg/dL — ABNORMAL HIGH (ref 70–99)
Glucose-Capillary: 224 mg/dL — ABNORMAL HIGH (ref 70–99)
Glucose-Capillary: 234 mg/dL — ABNORMAL HIGH (ref 70–99)
Glucose-Capillary: 273 mg/dL — ABNORMAL HIGH (ref 70–99)
Glucose-Capillary: 284 mg/dL — ABNORMAL HIGH (ref 70–99)
Glucose-Capillary: 44 mg/dL — CL (ref 70–99)
Glucose-Capillary: 55 mg/dL — ABNORMAL LOW (ref 70–99)
Glucose-Capillary: 58 mg/dL — ABNORMAL LOW (ref 70–99)
Glucose-Capillary: 91 mg/dL (ref 70–99)

## 2010-08-14 LAB — URINALYSIS, ROUTINE W REFLEX MICROSCOPIC
Glucose, UA: >1000 mg/dL — AB
Hgb urine dipstick: NEGATIVE
Leukocytes, UA: NEGATIVE
Nitrite: NEGATIVE
Specific Gravity, Urine: 1.02 (ref 1.005–1.030)
Specific Gravity, Urine: 1.024 (ref 1.005–1.030)
Urobilinogen, UA: 0.2 mg/dL (ref 0.0–1.0)
pH: 5 (ref 5.0–8.0)

## 2010-08-14 LAB — CBC
HCT: 34.5 % — ABNORMAL LOW (ref 36.0–46.0)
HCT: 39.2 % (ref 36.0–46.0)
Hemoglobin: 10.2 g/dL — ABNORMAL LOW (ref 12.0–15.0)
MCH: 28.4 pg (ref 26.0–34.0)
MCH: 28.6 pg (ref 26.0–34.0)
MCH: 29.1 pg (ref 26.0–34.0)
MCHC: 32.5 g/dL (ref 30.0–36.0)
MCHC: 34.2 g/dL (ref 30.0–36.0)
Platelets: 176 10*3/uL (ref 150–400)
Platelets: 206 10*3/uL (ref 150–400)
RBC: 3.56 MIL/uL — ABNORMAL LOW (ref 3.87–5.11)
RBC: 3.57 MIL/uL — ABNORMAL LOW (ref 3.87–5.11)
RDW: 14.1 % (ref 11.5–15.5)
RDW: 14.3 % (ref 11.5–15.5)
WBC: 5.5 10*3/uL (ref 4.0–10.5)

## 2010-08-14 LAB — BASIC METABOLIC PANEL
BUN: 13 mg/dL (ref 6–23)
BUN: 16 mg/dL (ref 6–23)
BUN: 3 mg/dL — ABNORMAL LOW (ref 6–23)
BUN: 5 mg/dL — ABNORMAL LOW (ref 6–23)
CO2: 18 mEq/L — ABNORMAL LOW (ref 19–32)
CO2: 19 mEq/L (ref 19–32)
CO2: 20 mEq/L (ref 19–32)
Calcium: 7.6 mg/dL — ABNORMAL LOW (ref 8.4–10.5)
Calcium: 7.9 mg/dL — ABNORMAL LOW (ref 8.4–10.5)
Calcium: 7.9 mg/dL — ABNORMAL LOW (ref 8.4–10.5)
Calcium: 8.3 mg/dL — ABNORMAL LOW (ref 8.4–10.5)
Chloride: 109 mEq/L (ref 96–112)
Chloride: 112 mEq/L (ref 96–112)
Chloride: 115 mEq/L — ABNORMAL HIGH (ref 96–112)
Chloride: 115 mEq/L — ABNORMAL HIGH (ref 96–112)
Creatinine, Ser: 0.64 mg/dL (ref 0.4–1.2)
Creatinine, Ser: 0.77 mg/dL (ref 0.4–1.2)
Creatinine, Ser: 0.84 mg/dL (ref 0.4–1.2)
Creatinine, Ser: 1.16 mg/dL (ref 0.4–1.2)
Creatinine, Ser: 1.27 mg/dL — ABNORMAL HIGH (ref 0.4–1.2)
GFR calc Af Amer: 52 mL/min — ABNORMAL LOW (ref >60)
GFR calc Af Amer: >60 mL/min (ref >60)
GFR calc Af Amer: >60 mL/min (ref >60)
GFR calc non Af Amer: 43 mL/min — ABNORMAL LOW (ref >60)
Glucose, Bld: 134 mg/dL — ABNORMAL HIGH (ref 70–99)
Glucose, Bld: 162 mg/dL — ABNORMAL HIGH (ref 70–99)
Glucose, Bld: 203 mg/dL — ABNORMAL HIGH (ref 70–99)
Potassium: 3.5 mEq/L (ref 3.5–5.1)
Potassium: 4.4 mEq/L (ref 3.5–5.1)
Sodium: 140 mEq/L (ref 135–145)

## 2010-08-14 LAB — URINE MICROSCOPIC-ADD ON

## 2010-08-14 LAB — DIFFERENTIAL
Basophils Absolute: 0 10*3/uL (ref 0.0–0.1)
Basophils Relative: 0 % (ref 0–1)
Eosinophils Absolute: 0.7 10*3/uL (ref 0.0–0.7)
Eosinophils Relative: 1 % (ref 0–5)
Monocytes Absolute: 1 10*3/uL (ref 0.1–1.0)
Monocytes Relative: 12 % (ref 3–12)
Monocytes Relative: 7 % (ref 3–12)
Neutrophils Relative %: 70 % (ref 43–77)
Neutrophils Relative %: 73 % (ref 43–77)
Smear Review: ADEQUATE

## 2010-08-14 LAB — URINE CULTURE
Colony Count: NO GROWTH
Culture  Setup Time: 201108052039
Culture  Setup Time: 201108062024

## 2010-08-14 LAB — HEMOGLOBIN A1C
Hgb A1c MFr Bld: 10.8 % — ABNORMAL HIGH (ref <5.7)
Mean Plasma Glucose: 263 mg/dL — ABNORMAL HIGH (ref <117)

## 2010-08-14 LAB — CK TOTAL AND CKMB (NOT AT ARMC): Total CK: 38 U/L (ref 7–177)

## 2010-08-14 LAB — POCT I-STAT 3, VENOUS BLOOD GAS (G3P V): Acid-base deficit: 19 mmol/L — ABNORMAL HIGH (ref 0.0–2.0)

## 2010-08-14 LAB — MRSA PCR SCREENING: MRSA by PCR: NEGATIVE

## 2010-08-14 LAB — APTT: aPTT: 32 seconds (ref 24–37)

## 2010-08-14 LAB — PROTIME-INR
INR: 1.18 (ref 0.00–1.49)
Prothrombin Time: 15.2 seconds (ref 11.6–15.2)

## 2010-08-14 LAB — LIPASE, BLOOD: Lipase: 13 U/L (ref 11–59)

## 2010-08-14 LAB — CARDIAC PANEL(CRET KIN+CKTOT+MB+TROPI)
CK, MB: 3.5 ng/mL (ref 0.3–4.0)
Relative Index: INVALID (ref 0.0–2.5)
Total CK: 61 U/L (ref 7–177)

## 2010-10-02 ENCOUNTER — Inpatient Hospital Stay (HOSPITAL_COMMUNITY)
Admission: EM | Admit: 2010-10-02 | Discharge: 2010-10-10 | DRG: 871 | Disposition: A | Discharge status: Home Health | Payer: Self-pay | Attending: Pulmonary Disease | Admitting: Pulmonary Disease

## 2010-10-02 ENCOUNTER — Emergency Department (HOSPITAL_COMMUNITY): Payer: Self-pay

## 2010-10-02 DIAGNOSIS — G9341 Metabolic encephalopathy: Secondary | ICD-10-CM | POA: Diagnosis present

## 2010-10-02 DIAGNOSIS — A419 Sepsis, unspecified organism: Principal | ICD-10-CM | POA: Diagnosis present

## 2010-10-02 DIAGNOSIS — L738 Other specified follicular disorders: Secondary | ICD-10-CM | POA: Diagnosis present

## 2010-10-02 DIAGNOSIS — J329 Chronic sinusitis, unspecified: Secondary | ICD-10-CM | POA: Diagnosis present

## 2010-10-02 DIAGNOSIS — Z9119 Patient's noncompliance with other medical treatment and regimen: Secondary | ICD-10-CM

## 2010-10-02 DIAGNOSIS — N179 Acute kidney failure, unspecified: Secondary | ICD-10-CM | POA: Diagnosis present

## 2010-10-02 DIAGNOSIS — E1010 Type 1 diabetes mellitus with ketoacidosis without coma: Secondary | ICD-10-CM | POA: Diagnosis present

## 2010-10-02 DIAGNOSIS — E876 Hypokalemia: Secondary | ICD-10-CM | POA: Diagnosis present

## 2010-10-02 DIAGNOSIS — R000 Tachycardia, unspecified: Secondary | ICD-10-CM | POA: Diagnosis present

## 2010-10-02 DIAGNOSIS — J40 Bronchitis, not specified as acute or chronic: Secondary | ICD-10-CM | POA: Diagnosis present

## 2010-10-02 DIAGNOSIS — Z91199 Patient's noncompliance with other medical treatment and regimen due to unspecified reason: Secondary | ICD-10-CM

## 2010-10-02 DIAGNOSIS — Z794 Long term (current) use of insulin: Secondary | ICD-10-CM

## 2010-10-02 DIAGNOSIS — R197 Diarrhea, unspecified: Secondary | ICD-10-CM | POA: Diagnosis present

## 2010-10-02 DIAGNOSIS — E871 Hypo-osmolality and hyponatremia: Secondary | ICD-10-CM | POA: Diagnosis present

## 2010-10-02 LAB — URINE MICROSCOPIC-ADD ON

## 2010-10-02 LAB — URINALYSIS, ROUTINE W REFLEX MICROSCOPIC
Bilirubin Urine: NEGATIVE
Nitrite: NEGATIVE
Specific Gravity, Urine: 1.028 (ref 1.005–1.030)
Urobilinogen, UA: 0.2 mg/dL (ref 0.0–1.0)

## 2010-10-02 LAB — POCT I-STAT 3, ART BLOOD GAS (G3+)
Bicarbonate: 6.7 mEq/L — ABNORMAL LOW (ref 20.0–24.0)
pH, Arterial: 7.214 — ABNORMAL LOW (ref 7.350–7.400)

## 2010-10-02 LAB — DIFFERENTIAL
Basophils Absolute: 0 10*3/uL (ref 0.0–0.1)
Eosinophils Absolute: 0 10*3/uL (ref 0.0–0.7)
Monocytes Absolute: 1.2 10*3/uL — ABNORMAL HIGH (ref 0.1–1.0)
Neutrophils Relative %: 62 % (ref 43–77)

## 2010-10-02 LAB — CBC
HCT: 36.4 % (ref 36.0–46.0)
Hemoglobin: 12.1 g/dL (ref 12.0–15.0)
MCH: 29.9 pg (ref 26.0–34.0)
MCH: 29.5 pg (ref 26.0–34.0)
MCHC: 33.2 g/dL (ref 30.0–36.0)
MCV: 90.6 fL (ref 78.0–100.0)
Platelets: 137 10*3/uL — ABNORMAL LOW (ref 150–400)
RBC: 4.03 MIL/uL (ref 3.87–5.11)

## 2010-10-02 LAB — BASIC METABOLIC PANEL
BUN: 40 mg/dL — ABNORMAL HIGH (ref 6–23)
CO2: 8 mEq/L — CL (ref 19–32)
CO2: 14 mEq/L — ABNORMAL LOW (ref 19–32)
Chloride: 84 mEq/L — ABNORMAL LOW (ref 96–112)
Chloride: 99 mEq/L (ref 96–112)
GFR calc Af Amer: 18 mL/min — ABNORMAL LOW (ref >60)
Glucose, Bld: 469 mg/dL — ABNORMAL HIGH (ref 70–99)
Potassium: 6.4 mEq/L (ref 3.5–5.1)
Potassium: 4.3 mEq/L (ref 3.5–5.1)
Potassium: 4.6 mEq/L (ref 3.5–5.1)
Sodium: 130 mEq/L — ABNORMAL LOW (ref 135–145)
Sodium: 131 mEq/L — ABNORMAL LOW (ref 135–145)

## 2010-10-02 LAB — RAPID URINE DRUG SCREEN, HOSP PERFORMED
Amphetamines: NOT DETECTED
Barbiturates: NOT DETECTED
Opiates: NOT DETECTED

## 2010-10-02 LAB — POCT I-STAT, CHEM 8
BUN: 33 mg/dL — ABNORMAL HIGH (ref 6–23)
HCT: 34 % — ABNORMAL LOW (ref 36.0–46.0)
Hemoglobin: 11.6 g/dL — ABNORMAL LOW (ref 12.0–15.0)
Sodium: 125 mEq/L — ABNORMAL LOW (ref 135–145)
TCO2: 6 mmol/L (ref 0–100)

## 2010-10-02 LAB — COMPREHENSIVE METABOLIC PANEL
BUN: 37 mg/dL — ABNORMAL HIGH (ref 6–23)
CO2: 7 mEq/L — CL (ref 19–32)
Chloride: 89 mEq/L — ABNORMAL LOW (ref 96–112)
Creatinine, Ser: 3.15 mg/dL — ABNORMAL HIGH (ref 0.4–1.2)
GFR calc non Af Amer: 15 mL/min — ABNORMAL LOW (ref >60)
Glucose, Bld: 836 mg/dL (ref 70–99)
Total Bilirubin: 0.3 mg/dL (ref 0.3–1.2)

## 2010-10-02 LAB — LIPASE, BLOOD: Lipase: 6 U/L — ABNORMAL LOW (ref 11–59)

## 2010-10-02 LAB — AMYLASE: Amylase: 24 U/L (ref 0–105)

## 2010-10-02 LAB — MAGNESIUM: Magnesium: 1.7 mg/dL (ref 1.5–2.5)

## 2010-10-02 LAB — GLUCOSE, CAPILLARY: Glucose-Capillary: >600 mg/dL (ref 70–99)

## 2010-10-03 ENCOUNTER — Inpatient Hospital Stay (HOSPITAL_COMMUNITY): Payer: Self-pay

## 2010-10-03 DIAGNOSIS — R578 Other shock: Secondary | ICD-10-CM

## 2010-10-03 DIAGNOSIS — E1010 Type 1 diabetes mellitus with ketoacidosis without coma: Secondary | ICD-10-CM

## 2010-10-03 LAB — CBC
HCT: 31.1 % — ABNORMAL LOW (ref 36.0–46.0)
MCV: 82.3 fL (ref 78.0–100.0)
RBC: 3.78 MIL/uL — ABNORMAL LOW (ref 3.87–5.11)
WBC: 9.7 10*3/uL (ref 4.0–10.5)

## 2010-10-03 LAB — BLOOD GAS, ARTERIAL
Bicarbonate: 16.6 mEq/L — ABNORMAL LOW (ref 20.0–24.0)
TCO2: 17.6 mmol/L (ref 0–100)
pCO2 arterial: 31.6 mmHg — ABNORMAL LOW (ref 35.0–45.0)
pH, Arterial: 7.34 — ABNORMAL LOW (ref 7.350–7.400)

## 2010-10-03 LAB — BASIC METABOLIC PANEL
BUN: 17 mg/dL (ref 6–23)
CO2: 18 mEq/L — ABNORMAL LOW (ref 19–32)
CO2: 21 mEq/L (ref 19–32)
Calcium: 7.4 mg/dL — ABNORMAL LOW (ref 8.4–10.5)
Calcium: 7.9 mg/dL — ABNORMAL LOW (ref 8.4–10.5)
Calcium: 8.1 mg/dL — ABNORMAL LOW (ref 8.4–10.5)
Chloride: 105 mEq/L (ref 96–112)
Chloride: 109 mEq/L (ref 96–112)
Creatinine, Ser: 1.89 mg/dL — ABNORMAL HIGH (ref 0.4–1.2)
Creatinine, Ser: 1.83 mg/dL — ABNORMAL HIGH (ref 0.4–1.2)
Creatinine, Ser: 1.65 mg/dL — ABNORMAL HIGH (ref 0.4–1.2)
GFR calc Af Amer: 26 mL/min — ABNORMAL LOW (ref >60)
GFR calc Af Amer: 33 mL/min — ABNORMAL LOW (ref >60)
GFR calc Af Amer: 34 mL/min — ABNORMAL LOW (ref >60)
GFR calc Af Amer: 38 mL/min — ABNORMAL LOW (ref >60)
GFR calc non Af Amer: 28 mL/min — ABNORMAL LOW (ref >60)
Potassium: 3.9 mEq/L (ref 3.5–5.1)
Sodium: 135 mEq/L (ref 135–145)

## 2010-10-03 LAB — GLUCOSE, CAPILLARY
Glucose-Capillary: 111 mg/dL — ABNORMAL HIGH (ref 70–99)
Glucose-Capillary: 129 mg/dL — ABNORMAL HIGH (ref 70–99)
Glucose-Capillary: 183 mg/dL — ABNORMAL HIGH (ref 70–99)
Glucose-Capillary: 279 mg/dL — ABNORMAL HIGH (ref 70–99)
Glucose-Capillary: 161 mg/dL — ABNORMAL HIGH (ref 70–99)
Glucose-Capillary: 160 mg/dL — ABNORMAL HIGH (ref 70–99)
Glucose-Capillary: 153 mg/dL — ABNORMAL HIGH (ref 70–99)
Glucose-Capillary: 146 mg/dL — ABNORMAL HIGH (ref 70–99)
Glucose-Capillary: 203 mg/dL — ABNORMAL HIGH (ref 70–99)

## 2010-10-03 LAB — COMPREHENSIVE METABOLIC PANEL
AST: 36 U/L (ref 0–37)
Albumin: 2.5 g/dL — ABNORMAL LOW (ref 3.5–5.2)
Alkaline Phosphatase: 63 U/L (ref 39–117)
Chloride: 111 mEq/L (ref 96–112)
Creatinine, Ser: 2.06 mg/dL — ABNORMAL HIGH (ref 0.4–1.2)
GFR calc Af Amer: 29 mL/min — ABNORMAL LOW (ref >60)
GFR calc non Af Amer: 24 mL/min — ABNORMAL LOW (ref >60)
Potassium: 4.7 mEq/L (ref 3.5–5.1)
Total Protein: 5.7 g/dL — ABNORMAL LOW (ref 6.0–8.3)

## 2010-10-03 LAB — DIFFERENTIAL
Basophils Relative: 0 % (ref 0–1)
Eosinophils Absolute: 0.2 10*3/uL (ref 0.0–0.7)
Eosinophils Relative: 2 % (ref 0–5)
Monocytes Relative: 8 % (ref 3–12)
Neutrophils Relative %: 52 % (ref 43–77)

## 2010-10-03 LAB — CARBOXYHEMOGLOBIN
Carboxyhemoglobin: 0.7 % (ref 0.5–1.5)
O2 Saturation: 73.5 %
Total hemoglobin: 9.7 g/dL — ABNORMAL LOW (ref 12.5–16.0)

## 2010-10-03 LAB — PHOSPHORUS: Phosphorus: 2.4 mg/dL (ref 2.3–4.6)

## 2010-10-03 LAB — LACTIC ACID, PLASMA: Lactic Acid, Venous: 1.2 mmol/L (ref 0.5–2.2)

## 2010-10-04 LAB — GLUCOSE, CAPILLARY
Glucose-Capillary: 120 mg/dL — ABNORMAL HIGH (ref 70–99)
Glucose-Capillary: 123 mg/dL — ABNORMAL HIGH (ref 70–99)
Glucose-Capillary: 128 mg/dL — ABNORMAL HIGH (ref 70–99)
Glucose-Capillary: 159 mg/dL — ABNORMAL HIGH (ref 70–99)
Glucose-Capillary: 201 mg/dL — ABNORMAL HIGH (ref 70–99)
Glucose-Capillary: 220 mg/dL — ABNORMAL HIGH (ref 70–99)
Glucose-Capillary: 277 mg/dL — ABNORMAL HIGH (ref 70–99)
Glucose-Capillary: 288 mg/dL — ABNORMAL HIGH (ref 70–99)
Glucose-Capillary: 317 mg/dL — ABNORMAL HIGH (ref 70–99)
Glucose-Capillary: 346 mg/dL — ABNORMAL HIGH (ref 70–99)
Glucose-Capillary: 357 mg/dL — ABNORMAL HIGH (ref 70–99)

## 2010-10-04 LAB — BASIC METABOLIC PANEL
Calcium: 8 mg/dL — ABNORMAL LOW (ref 8.4–10.5)
GFR calc Af Amer: 45 mL/min — ABNORMAL LOW (ref >60)
GFR calc non Af Amer: 37 mL/min — ABNORMAL LOW (ref >60)
Potassium: 3.2 mEq/L — ABNORMAL LOW (ref 3.5–5.1)
Sodium: 132 mEq/L — ABNORMAL LOW (ref 135–145)

## 2010-10-04 LAB — PHOSPHORUS: Phosphorus: 1.1 mg/dL — ABNORMAL LOW (ref 2.3–4.6)

## 2010-10-04 LAB — MAGNESIUM: Magnesium: 1.4 mg/dL — ABNORMAL LOW (ref 1.5–2.5)

## 2010-10-04 LAB — URINE CULTURE
Colony Count: NO GROWTH
Culture: NO GROWTH

## 2010-10-04 LAB — CBC
MCV: 81.3 fL (ref 78.0–100.0)
Platelets: 118 10*3/uL — ABNORMAL LOW (ref 150–400)
RBC: 3.58 MIL/uL — ABNORMAL LOW (ref 3.87–5.11)
RDW: 11.8 % (ref 11.5–15.5)
WBC: 6.9 10*3/uL (ref 4.0–10.5)

## 2010-10-05 LAB — PHOSPHORUS: Phosphorus: 1.9 mg/dL — ABNORMAL LOW (ref 2.3–4.6)

## 2010-10-05 LAB — CBC
HCT: 27.6 % — ABNORMAL LOW (ref 36.0–46.0)
Hemoglobin: 9.7 g/dL — ABNORMAL LOW (ref 12.0–15.0)
MCH: 28.9 pg (ref 26.0–34.0)
MCHC: 35.1 g/dL (ref 30.0–36.0)
RBC: 3.36 MIL/uL — ABNORMAL LOW (ref 3.87–5.11)

## 2010-10-05 LAB — BASIC METABOLIC PANEL
CO2: 22 mEq/L (ref 19–32)
Calcium: 8 mg/dL — ABNORMAL LOW (ref 8.4–10.5)
Chloride: 105 mEq/L (ref 96–112)
Creatinine, Ser: 1.06 mg/dL (ref 0.4–1.2)
GFR calc Af Amer: >60 mL/min (ref >60)
Glucose, Bld: 176 mg/dL — ABNORMAL HIGH (ref 70–99)

## 2010-10-05 LAB — CLOSTRIDIUM DIFFICILE BY PCR: Toxigenic C. Difficile by PCR: NEGATIVE

## 2010-10-05 LAB — GLUCOSE, CAPILLARY: Glucose-Capillary: 106 mg/dL — ABNORMAL HIGH (ref 70–99)

## 2010-10-06 DIAGNOSIS — A419 Sepsis, unspecified organism: Secondary | ICD-10-CM

## 2010-10-06 DIAGNOSIS — N170 Acute kidney failure with tubular necrosis: Secondary | ICD-10-CM

## 2010-10-06 DIAGNOSIS — R6521 Severe sepsis with septic shock: Secondary | ICD-10-CM

## 2010-10-06 LAB — GLUCOSE, CAPILLARY
Glucose-Capillary: 118 mg/dL — ABNORMAL HIGH (ref 70–99)
Glucose-Capillary: 126 mg/dL — ABNORMAL HIGH (ref 70–99)
Glucose-Capillary: 126 mg/dL — ABNORMAL HIGH (ref 70–99)
Glucose-Capillary: 148 mg/dL — ABNORMAL HIGH (ref 70–99)

## 2010-10-06 LAB — CALCIUM, IONIZED: Calcium, Ion: 1.07 mmol/L — ABNORMAL LOW (ref 1.12–1.32)

## 2010-10-06 LAB — DIFFERENTIAL
Basophils Relative: 0 % (ref 0–1)
Eosinophils Absolute: 0.5 10*3/uL (ref 0.0–0.7)
Eosinophils Relative: 7 % — ABNORMAL HIGH (ref 0–5)
Lymphs Abs: 1.4 10*3/uL (ref 0.7–4.0)
Monocytes Relative: 8 % (ref 3–12)
Neutrophils Relative %: 63 % (ref 43–77)

## 2010-10-06 LAB — PROCALCITONIN: Procalcitonin: 1.45 ng/mL

## 2010-10-06 LAB — BASIC METABOLIC PANEL
BUN: 3 mg/dL — ABNORMAL LOW (ref 6–23)
CO2: 21 mEq/L (ref 19–32)
Calcium: 7.5 mg/dL — ABNORMAL LOW (ref 8.4–10.5)
Creatinine, Ser: 0.88 mg/dL (ref 0.4–1.2)
GFR calc non Af Amer: >60 mL/min (ref >60)
Glucose, Bld: 110 mg/dL — ABNORMAL HIGH (ref 70–99)

## 2010-10-06 LAB — CBC
MCH: 29.4 pg (ref 26.0–34.0)
MCV: 83.3 fL (ref 78.0–100.0)
Platelets: 125 10*3/uL — ABNORMAL LOW (ref 150–400)
RDW: 12.1 % (ref 11.5–15.5)

## 2010-10-06 LAB — LACTIC ACID, PLASMA: Lactic Acid, Venous: 0.7 mmol/L (ref 0.5–2.2)

## 2010-10-07 DIAGNOSIS — R6520 Severe sepsis without septic shock: Secondary | ICD-10-CM

## 2010-10-07 DIAGNOSIS — A419 Sepsis, unspecified organism: Secondary | ICD-10-CM

## 2010-10-07 LAB — BASIC METABOLIC PANEL
BUN: 6 mg/dL (ref 6–23)
Calcium: 7.8 mg/dL — ABNORMAL LOW (ref 8.4–10.5)
Creatinine, Ser: 0.79 mg/dL (ref 0.4–1.2)
GFR calc non Af Amer: >60 mL/min (ref >60)
Glucose, Bld: 263 mg/dL — ABNORMAL HIGH (ref 70–99)
Potassium: 3.4 mEq/L — ABNORMAL LOW (ref 3.5–5.1)

## 2010-10-07 LAB — GLUCOSE, CAPILLARY
Glucose-Capillary: 161 mg/dL — ABNORMAL HIGH (ref 70–99)
Glucose-Capillary: 140 mg/dL — ABNORMAL HIGH (ref 70–99)
Glucose-Capillary: 279 mg/dL — ABNORMAL HIGH (ref 70–99)
Glucose-Capillary: 258 mg/dL — ABNORMAL HIGH (ref 70–99)

## 2010-10-07 LAB — MAGNESIUM: Magnesium: 2.1 mg/dL (ref 1.5–2.5)

## 2010-10-08 LAB — CULTURE, BLOOD (ROUTINE X 2)
Culture  Setup Time: 201205042102
Culture: NO GROWTH

## 2010-10-08 LAB — PHOSPHORUS: Phosphorus: 1.9 mg/dL — ABNORMAL LOW (ref 2.3–4.6)

## 2010-10-08 LAB — BASIC METABOLIC PANEL
Calcium: 7.6 mg/dL — ABNORMAL LOW (ref 8.4–10.5)
Creatinine, Ser: 0.8 mg/dL (ref 0.4–1.2)
GFR calc Af Amer: >60 mL/min (ref >60)
GFR calc non Af Amer: >60 mL/min (ref >60)
Sodium: 140 mEq/L (ref 135–145)

## 2010-10-08 LAB — GLUCOSE, CAPILLARY
Glucose-Capillary: 157 mg/dL — ABNORMAL HIGH (ref 70–99)
Glucose-Capillary: 136 mg/dL — ABNORMAL HIGH (ref 70–99)
Glucose-Capillary: 217 mg/dL — ABNORMAL HIGH (ref 70–99)

## 2010-10-08 LAB — MAGNESIUM: Magnesium: 2.1 mg/dL (ref 1.5–2.5)

## 2010-10-09 LAB — BASIC METABOLIC PANEL
CO2: 24 mEq/L (ref 19–32)
Calcium: 7.8 mg/dL — ABNORMAL LOW (ref 8.4–10.5)
Creatinine, Ser: 0.76 mg/dL (ref 0.4–1.2)
GFR calc Af Amer: >60 mL/min (ref >60)

## 2010-10-09 LAB — CULTURE, BLOOD (ROUTINE X 2): Culture  Setup Time: 201205050025

## 2010-10-09 LAB — GLUCOSE, CAPILLARY
Glucose-Capillary: 175 mg/dL — ABNORMAL HIGH (ref 70–99)
Glucose-Capillary: 240 mg/dL — ABNORMAL HIGH (ref 70–99)

## 2010-10-09 LAB — MAGNESIUM: Magnesium: 2 mg/dL (ref 1.5–2.5)

## 2010-10-09 LAB — PHOSPHORUS: Phosphorus: 2.8 mg/dL (ref 2.3–4.6)

## 2010-10-10 LAB — GLUCOSE, CAPILLARY
Glucose-Capillary: 147 mg/dL — ABNORMAL HIGH (ref 70–99)
Glucose-Capillary: 166 mg/dL — ABNORMAL HIGH (ref 70–99)
Glucose-Capillary: 65 mg/dL — ABNORMAL LOW (ref 70–99)

## 2010-10-13 NOTE — H&P (Signed)
[name], [name] NO.:  [id number]   MEDICAL RECORD NO.:  [id number]          PATIENT TYPE:  INP   LOCATION:  1831                         FACILITY:  [facility]   PHYSICIAN:  Michiel Cowboy, MDDATE OF BIRTH:  02-06-1946   DATE OF ADMISSION:  04/22/2008  DATE OF DISCHARGE:                              HISTORY & PHYSICAL   PRIMARY CARE PHYSICIAN:  Neta Mends. Panosh, MD, [place].   CHIEF COMPLAINT:  Nausea, vomiting, diarrhea.   HISTORY OF PRESENT ILLNESS:  The patient is a 65 year old female with  past medical history of type 1 diabetes, hypertension, and GERD, who  since today, started to have severe nausea and vomiting and up to 15  bowel movements.  Her blood sugar initially was 500 when she [facility], but has been down to 120s.  She has not had any  ketones in her urine, otherwise, had not had any fevers up until coming  to the emergency department when her temperature was elevated to 100.  Otherwise review of systems unremarkable except for what is stated  above.  No chest pain, no shortness of breath up until yesterday when  she was feeling well.  She had some sick contacts, in particular, her  grandchildren.  A lot of them actually currently having nausea,  vomiting, and diarrhea as well.  She is light-headed when she stands up  and appears dehydrated.   PAST MEDICAL HISTORY:  1. Significant for type 1 diabetes per patient.  2. History of GERD.  3. Hyperthyroidism.   SOCIAL HISTORY:  The patient currently does not smoke, does not use  drugs, drinks occasionally.   FAMILY HISTORY:  Noncontributory.   ALLERGIES:  No known drug allergies.   MEDICATIONS:  1. Sliding scale insulin.  2. Lantus 12 units once a day.  3. Synthroid 125 mcg daily.   PHYSICAL EXAMINATION:  VITAL SIGNS:  Temperature 100, blood pressure  118/77, pulse 101, pulse oximetry 97% on room air.  GENERAL:  The patient appears to be in no acute distress,  laying down in  bed.  HEENT:  Head nontraumatic, dry mucous membranes.  The patient says she  is thirsty.  SKIN:  Decreased skin turgor noted.  LUNGS:  Acute and coarse sounds, but otherwise unremarkable.  HEART:  Rapid but regular, no murmurs, rubs, or gallops.  EXTREMITIES:  Lower extremities:  No clubbing, cyanosis, or edema.  ABDOMEN:  Soft, nontender, nondistended.  NEUROLOGIC:  Cranial nerves II through XII intact.  Strength is 5/5 in  all four extremities.   LABORATORY DATA:  CBC not obtained.  Sodium 139, potassium 4.2, chloride  105, bicarb 21, calcium 9.1, glucose now down to 129.  UA shows 21-50  white blood cells and many bacteria.  UA also has no ketones in it.   No imaging studies were obtained.   ASSESSMENT/PLAN:  This is a 65 year old female with history of type 1  diabetes and hypothyroidism, who presents with nausea, vomiting, and  diarrhea.  No evidence of DKA at this point.  1. Nausea, vomiting, diarrhea, resulting in dehydration, possibly  gastroenteritis.  Will give IV fluids.  Check orthostatics and      follow.  Nausea and vomiting could possibly be explained by urinary      tract infection and pyelonephritis, but diarrhea does not quite      fit.  The patient may have two processes going on.  Will obtain      stool cultures.  Given low grade fever we will check for C.      difficile, although no antibiotic exposure which would make it      somewhat less common, and will also check white blood cell count      and stool.  2. Urinary tract infection with questionable pyelonephritis.  We will      obtain renal ultrasound to rule out perinephric abscess.  Will      cover with IV Cipro for now and give IV fluids.  3. History of diabetes, type 1.  Will check hemoglobin A1c.  Continue      sliding scale and Lantus.  Follow-up care for the blood sugars as      the patient is at risk for DKA.  4. Hypothyroidism.  Check TSH.  Continue Synthroid.  5. Prophylaxis.   The patient has a history of gastroesophageal reflux      disease and she will do Protonix and Lovenox.  Dr. Corinda Gubler will      assume care in the morning.      Michiel Cowboy, MD  Electronically Signed     AVD/MEDQ  D:  04/22/2008  T:  04/23/2008  Job:  [id number]   cc:   Neta Mends. Fabian Sharp, MD

## 2010-10-13 NOTE — Discharge Summary (Signed)
NAMEJOSSALIN, [name] NO.:  [id number]   MEDICAL RECORD NO.:  [id number]          PATIENT TYPE:  INP   LOCATION:  2038                         FACILITY:  [facility]   PHYSICIAN:  Barbette Hair. Artist Pais, DO      DATE OF BIRTH:  Jan 26, 1946   DATE OF ADMISSION:  04/22/2008  DATE OF DISCHARGE:  04/25/2008                               DISCHARGE SUMMARY   DISCHARGE DIAGNOSES:  1. Nausea, vomiting, and diarrhea secondary to urinary tract      infection.  2. Type 1 diabetes, uncontrolled.  3. Gastroesophageal reflux disease.  4. Hyperlipidemia.  5. Hypothyroidism.   DISCHARGE MEDICATIONS:  1. Lantus 17 units once daily.  2. NovoLog 3-5 units before supper.  3. Simvastatin 40 mg once daily.  4. Levothroid 150 mcg once daily.  5. Baby aspirin 81 mg once daily.   DISCHARGE INSTRUCTIONS:  The patient is to call Dr. Rosezella [place] office for  followup appointment in 1 week.  The patient is advised to contact her  physician if she develops recurrent nausea, vomiting, or diarrhea.   HOSPITAL COURSE:  The patient is a 65 year old white female admitted on  April 22, 2008 secondary to severe nausea, vomiting, and diarrhea.  The patient was seen initially at Urgent Care and her blood sugar was  noted to be 500.  Her temperature was slightly elevated up to 100.  Emergency room evaluation noted positive UA.  There was question of  pyelonephritis.  The patient was empirically treated with initially with  IV Cipro, then converted to IV Rocephin.  Renal ultrasound was negative  for perinephric abscess.  Her urine culture showed multiple bacterial  morphologies, likely contamination.   The patient's nausea, vomiting, and diarrhea resolved with IV hydration  and IV antibiotics.  On day of discharge, the patient was tolerating  p.o.'s and her diarrhea had completely resolved.  On admission, there  was no evidence of diabetic ketoacidosis.  Her bicarb was 20.  Her  previous A1c was greater than  11 in the past.  Her blood sugars ranged  from mid 150s-200 during hospitalization.  The Lantus was titrated to 17  units.  She will likely require further adjustment of meal-time insulin.   Her TSH was elevated on admission.  Levothroid dose was increased to 150  mcg.  She will need follow up TSH in 6-8 weeks.   CONDITION ON DISCHARGE:  The patient's UTI had resolved.  She received 3  days of IV antibiotics.  She was medically stable for discharge.      Barbette Hair. Artist Pais, DO  Electronically Signed     RDY/MEDQ  D:  04/25/2008  T:  04/25/2008  Job:  [id number]   cc:   Neta Mends. Fabian Sharp, MD

## 2010-10-14 NOTE — Discharge Summary (Signed)
[name], [name]              ACCOUNT NO.:  [id number]  MEDICAL RECORD NO.:  [id number]           PATIENT TYPE:  I  LOCATION:  3109                         FACILITY:  [facility]  PHYSICIAN:  Kalman Shan, MD   DATE OF BIRTH:  12-Jan-1946  DATE OF ADMISSION:  10/02/2010 DATE OF DISCHARGE:  10/10/2010                              DISCHARGE SUMMARY   DISCHARGE DIAGNOSES: 1. Diabetic ketoacidosis. 2. Severe sepsis, septic shock secondary to sinusitis and probable     bronchitis. 3. Noninfectious diarrhea. 4. Metabolic encephalopathy resolved. 5. Metabolic acidosis resolved. 6. Renal failure resolved. 7. Hypokalemia resolved.  LABORATORY DATA:  Ionized calcium Oct 09, 2010 1.15; Oct 09, 2010, phosphorus 2.8, magnesium 2, sodium 140, potassium 3.7, chloride 110, CO2 24, glucose 189, BUN 9, creatinine 0.76.  MICROBIOLOGY DATA:  Blood cultures x2 on Oct 02, 2010 both negative.  Oct 02, 2010, urine culture negative.  PROCEDURES:  Right internal jugular vein triple-lumen catheter placed Oct 02, 2010.  BRIEF HISTORY:  A 65 year old white female with known history of diabetes.  He presented to the emergency room with altered sensorium, hyperglycemia with glucose greater than 600, pro calcitonin of 14.97 and metabolic acidosis all consistent with diabetic ketoacidosis.  She remained hypotensive in spite of 1 liter normal saline IV fluid resuscitation.  Because of this pulmonary critical care was asked to admit in the setting of shock.  HOSPITAL COURSE BY DISCHARGE DIAGNOSES: 1. Diabetic ketoacidosis.  This is in the setting of severe sepsis     septic shock secondary to severe sinusitis and probable bronchitis,     complicated by the patient not able to take her insulin on the     preceding days.  Ms. Collazos was admitted to the intensive care.     She was given aggressive fluid volume resuscitation.  Central     access was placed to help guide fluid administration, she did  require brief vasoactive drip support, as well as placement on     broad-spectrum antibiotics.  She was placed initially on vancomycin     and Zosyn as she did have a skin boil on her left arm which we were     concerned could be potential source initially, and Zosyn to cover     for other broad-spectrum potential sources.  Ultimately after being     able to obtain a history, it was evident that most likely source     was acute sinusitis by physical exam and history.  At that point     she was transitioned from Zosyn which was initially started on Oct 02, 2010 to Avelox on Oct 08, 2010.  She will complete her Avelox on     Oct 12, 2010 and a prescription will be given.  Upon time of     discharge, she is hemodynamically stable and no longer in shock.     Again the initial source was felt to be an acute sinusitis. 2. Diabetic ketoacidosis.  This is secondary to patient not taking her     insulin, superimposed on top of an active infection resulting in  progressive hyperglycemia.  She did present with a anion gap     acidosis, and clinical findings consistent with a diabetic     ketoacidosis presentation.  She was treated in the usual manner     with aggressive volume resuscitation, and supplemental IV insulin.     Her anion gap closed, her glucose regulated, she is now taking     regular diet with sliding scale insulin, as well as basal insulin     with Lantus dosing.  She will be discharged to home with     instructions to continue her regular Lantus and sliding-scale     regimen as previously prescribed by Dr. Clelia Croft, her primary care     physician, and I have instructed her to follow up with Dr. Clelia Croft in     1 week time. 3. Delirium.  This is secondary to toxic metabolic encephalopathy.  No     doubt initially contributed by diabetic ketoacidosis.  This is now     resolved. 4. Acute renal failure.  This is resolved following aggressive volume     resuscitation efforts. 5.  Hypokalemia this is resolved.  DISCHARGE INSTRUCTIONS:  Diet:  To resume regular diabetic low carbohydrate diet.  Discharge activity:  As tolerated.  Followup:  Dr. Clelia Croft 1 week following discharge.  This is through [facility], and the patient will arrange this followup.  DISCHARGE MEDICATIONS: 1. Moxifloxacin 400 mg daily for 3 days. 2. Aspirin 81 mg daily. 3. Lantus 16 units before bed. 4. Levothyroxine 137 mcg p.o. daily. 5. NovoLog sliding scale insulin.  She will take her sliding scale as     previously prescribed. 6. Tylenol Extra Strength 500 mg p.o. q.6 h. as needed.  DISPOSITION:  Ms. Beasley has met maximum benefit from inpatient stay. She is now medically cleared for discharge with followup with Dr. Clelia Croft as previously mentioned.  She will follow up with Dr. Clelia Croft in outpatient setting for further evaluation and therapy in regards to her diabetes. Ms. Souffrant is currently unemployed, and obtaining her medications as well as making her followup appointments has quite a financial hardship. She did have quite a bit of interaction with her case manager team, contact numbers in regards to free clinics in addition to strong urge to continue her already established care at [facility] were continued to be reinforced.> 35 min discharge planning  Zenia Resides, NP   ______________________________ Kalman Shan, MD    PB/MEDQ  D:  10/09/2010  T:  10/09/2010  Job:  [id number]  Electronically Signed by Zenia Resides NP on 10/09/2010 01:26:34 PM Electronically Signed by Kalman Shan MD on 10/14/2010 08:44:29 AM

## 2010-10-16 NOTE — Consult Note (Signed)
[name], [name]              ACCOUNT NO.:  [id number]   MEDICAL RECORD NO.:  [id number]          PATIENT TYPE:  INP   LOCATION:  3310                         FACILITY:  [facility]   PHYSICIAN:  Theodore Demark, P.A. LHCDATE OF BIRTH:  January 27, 1946   DATE OF CONSULTATION:  12/20/2005  DATE OF DISCHARGE:                                   CONSULTATION   PRIMARY CARDIOLOGIST:  Maisie Fus C. Wall, M.D.   PRIMARY M.D.:  Tera Mater. Clent Ridges, M.D. [facility]   CHIEF COMPLAINT:  Elevated cardiac enzymes.   HISTORY OF PRESENT ILLNESS:  [name] is a 65 year old female with no  previous history of coronary artery disease.  She was admitted on December 19, 2005 for nausea and vomiting.  There was concern for an anginal equivalent  to her symptoms and CK-MB was elevated, so cardiology was asked to evaluate  her.   [name] stated that her symptoms started Wednesday night with general  malaise.  On Thursday she was nauseated and had some vomiting, but was able  to keep food down.  On Friday she became unable to keep water down and by  Saturday she was feeling weak, so she came to the emergency room where she  was hydrated and given a prescription for an anti-emetic and discharged.  On  Sunday, her symptoms had not improved despite the medication and she was  still unable to keep water down.  She was getting weak again, so she came to  the emergency room.  She was admitted for further evaluation and treatment.   Since admission, she has been hydrated with approximately 10 L of IV fluids.  Her systolic blood pressure was in the 80s earlier today and she was started  on Dopamine as well.  Her blood pressure has been 90-100 until recently even  with Dopamine, but has now improved slightly and the Dopamine, which was at  10 mcg/kg/min is now being weaned down within the parameters that were on  the initial order.   [name] never gets chest pain.  She denies shortness of breath.  She has  some chronic dyspnea  on exertion, but this has not changed recently.  She  does not exercise and admits to a diet that is poor.  She states that there  is no event or incident recently that would account for this.  She does not  feel like she ate any bad food.  She has not had diarrhea.  She has not run  a fever.  She did not miss a dose of insulin until Saturday.  In checking  her blood sugars at home, they were not significantly elevated until  Saturday.  Other than the nausea and the vomiting, she has no specific  complaints at this time.   PAST MEDICAL HISTORY:  1.  She is a type 1 insulin-dependent diabetic.  2.  She has no history of hypertension, but states that she has been told      that her cholesterol is high, although she cannot remember any details.  3.  She has a family history of coronary  artery disease.  4.  There is no history of tobacco or alcohol or drug abuse.  5.  She also has a history of hypothyroidism.   PAST SURGICAL HISTORY:  She is status post c-section.   ALLERGIES:  No known drug allergies.   MEDICATIONS:  Currently she is on:  1.  Dopamine at 10 mcg/kg/min.  2.  Normal saline at 250 mL/h.  3.  Protonix 40 mg p.o. daily.  4.  Synthroid 50 mcg daily.  5.  Rocephin 1 g IV daily.   SOCIAL HISTORY:  She lives alone in [place] and works as a Occupational hygienist.  She is divorced.  She has history no of history of alcohol,  tobacco, or drug abuse.  She admits to a poor diet and does not exercise.   FAMILY HISTORY:  Her father died at age 34 of an MI and her mother died at  35 also with a history of heart disease, but there is no heart disease in  her siblings.   REVIEW OF SYSTEMS:  She denies any recent fevers or chills.  She has been  coughing recently.  She denies any wheezing.  She had diarrhea on Saturday,  a couple of episodes, but nothing since.  She has not had any hematemesis,  hemoptysis, or melena.  She has occasional left upper abdominal pain, which  she feels is  secondary to reflux, as it gets better with Pepcid AC.  She is  followed closely by Dr. Clent Ridges for her diabetes and hypothyroidism.  Review of  systems is otherwise negative.   PHYSICAL EXAMINATION:  VITAL SIGNS:  Temperature is 100.0, with a blood  pressure of 124/58, heart rate of 84, respiration rate of 20, O2 saturation  of 97% on room air.  GENERAL:  She is a well-developed, well-nourished, white female in no acute  distress.  HEENT:  Her head is normocephalic and atraumatic with pupils are equal,  round, and reactive to light and accommodation, extraocular movements  intact, sclerae is clear, nose without discharge.  NECK:  There is no lymphadenopathy, thyromegaly, bruit, or JVD noted.  CV:  Her heart is regular in rate and rhythm with an S1 and S2, but no  significant murmur, rub, or gallop is appreciated.  Her distal pulses are 2+  and no femoral bruits are noted.  LUNGS:  Clear to auscultation bilaterally.  SKIN:  No rashes or lesions are noted.  ABDOMEN:  Soft with active bowel sounds and she has diffuse tenderness, but  no guarding and no point tenderness.  There is no hepatosplenomegaly by  palpation.  EXTREMITIES:  There is no cyanosis, clubbing, or edema.  MUSCULOSKELETAL:  There is no joint deformity or effusion and no spine or  CVA tenderness.  NEURO:  She is alert and oriented, cranial nerves II-XII are grossly intact.   Chest x-ray shows mild interstitial prominence, but no air space opacity and  acute disease.  EKG:  Sinus tachycardia rate of 107 with no acute ischemic  changes.  There is no old available for comparison.   LABORATORY VALUES:  Hemoglobin is 10.8, hematocrit is 323.5, WBCs are 13.5,  platelets are 174,000.  Sodium is 136, potassium is 3.5, chloride is 114,  CO2 is 15, BUN is 4, creatinine is 0.9, glucose is 91.  Initial CBG is 572.  CK-MB is 187/11.9 with a troponin I of 0.08.  Amylase of 25, lipase of 11.   IMPRESSION: 1.  Nausea and vomiting:  Her  symptoms began 5 days ago and over the      subsequent 24 hours she became unable to keep water down.  She has been      receiving Phenergan, which is not controlling the symptoms very well and      they have not improved.  We will leave management of this issue to      internal medicine.  2.  Elevated CK-MB and troponin:  Her CK-MB index was elevated at 6.4.  The      troponin was in the indeterminate range.  We will continue to cycle      enzymes.  If this had been a primary cardiac event, it is expected that      her troponin would be higher.  It is possible that her elevated cardiac      enzymes are secondary to the underlying medical problems of dehydration      and it's associated tachycardia.  However, if her CK-MB remains elevated      and her troponin increases, cardiac catheterization will have to be      considered, but this will be deferred until her general medical      condition improves.  3.  Hyperlipidemia:  We will check a lipid profile in a.m. with further      evaluation and treatment depending on those results.  4.  Diabetes:  [name] has received intensive therapy for this and a      blood sugar that was 572 on admission is now 127.  Her blood sugars have      been well controlled for the past 12 hours.  Further evaluation and      treatment of her diabetes is per internal medicine.   Again, this is Theodore Demark PA, [facility] dictating for Dr. Juanito Doom who saw the  patient and determined the plan of care.      Theodore Demark, P.A. [facility]     RB/MEDQ  D:  12/20/2005  T:  12/20/2005  Job:  [id number]

## 2010-10-16 NOTE — H&P (Signed)
[name], [name]              ACCOUNT NO.:  [id number]   MEDICAL RECORD NO.:  [id number]          PATIENT TYPE:  INP   LOCATION:  3314                         FACILITY:  [facility]   PHYSICIAN:  Georgina Quint. Plotnikov, MDDATE OF BIRTH:  06-29-1945   DATE OF ADMISSION:  08/27/2006  DATE OF DISCHARGE:                              HISTORY & PHYSICAL   CHIEF COMPLAINT:  Nausea and vomiting and weakness.   HISTORY OF PRESENT ILLNESS:  The patient is a 65 year old female with  likely type 1 diabetes onset at the age of 40, started to feel sick on  Thursday, was nauseated, threw up several times, had diarrhea, possibly  had some fevers too and was not feeling any better on Friday.  On  Saturday, started to feel worse with intractable nausea and vomiting and  diarrhea.  She ate last on Wednesday.  Presented to the ER and was found  to have low bicarbonate and was given Zofran and Protonix IV, started on  insulin drip.   CURRENT MEDICATIONS:  1. Lantus insulin 12 units daily.   DICTATION ENDS HERE      Georgina Quint. Plotnikov, MD  Electronically Signed     AVP/MEDQ  D:  08/27/2006  T:  08/28/2006  Job:  [id number]

## 2010-10-16 NOTE — Discharge Summary (Signed)
NAMEKAMONI, DEPREE NO.:  [id number]   MEDICAL RECORD NO.:  [id number]          PATIENT TYPE:  INP   LOCATION:  6714                         FACILITY:  [facility]   PHYSICIAN:  Rene Paci, M.D. LHCDATE OF BIRTH:  1945-06-21   DATE OF ADMISSION:  12/19/2005  DATE OF DISCHARGE:  12/24/2005                                 DISCHARGE SUMMARY   DISCHARGE DIAGNOSIS:  1.  Status post below the knee amputation with nausea and vomiting,      resolved.  2.  Hypotension second hypovolemic shock, resolved.  3.  Bronchitis/upper respiratory infection status post 48 hours empiric      Rocephin, no evidence of pneumonia or persisting infection, symptomatic      treatment p.r.n.  4.  Mild iron deficiency anemia, discharge hemoglobin 10.8, outpatient      follow-up p.r.n.  5.  History of hypothyroidism, continue on Synthroid.  6.  History of dyslipidemia with generally normal fasting lipid profile July      24.  7.  Abnormal cardiac enzymes likely secondary to number 1 status post      inpatient adenosine Cardiolite showing normal function with no evidence      of ischemia.  8.  Recent medication noncompliance due to lack of insurance with recent      loss of job to be re-insured by August 2007. The patient promises      continued compliance in the meanwhile with Lantus NovoLog as prescribed.   DISCHARGE MEDICATIONS:  1.  Lantus 14 units subcu q.h.s.  2.  NovoLog sliding scale insulin a.c. and h.s. as per previous      instructions.  3.  Synthroid 125 mcg once daily or as per previous dosing.   DISPOSITION:  The patient is discharged home in medically stable and  improved condition.  She feels well.  She is tolerating p.o. and she is  having no pain, no nausea or vomiting and understands the need for continued  compliance after this hospitalization.   HOSPITAL COURSE BY PROBLEM:  1:  Diabetic ketoacidosis with uncontrolled type 1 diabetes.  The patient is  a pleasant  65 year old woman with a long history of type 1 diabetes who says  that she has not had problems with her diabetes since being diagnosed many  years ago, who lost her job approximately two months ago and has since then  tried to maximize her insulin supplies by using as little as possible and,  thereby, not using the previously prescribed dosing. Approximately one week  prior to admission, she developed cough, congestion and bronchitis type  symptoms with brown sputum and progressed to nausea and vomiting, unable to  eat or drink for three days.  She came to the emergency room on July 21 and  was given a liter of fluid hydration and discharged home but returned again  on the 22nd due to persisting nausea, vomiting, and inability to tolerate  p.o.  At that time she was found to be in DKA with a bicarbonate level which  is low and a glucose level of 572.  She was  admitted for insulin drip per IV  Glucomander protocol and her hemoglobin A1c returned at 11.8, thus  confirming the patient's lack of tight control as per her history.  The  insulin drip was discontinued prematurely once her CBGs were controlled and  her bicarb level again fell to 13 on her basic metabolic as her nausea and  vomiting persisted despite control of glycemic values. She was restarted on  insulin drip with strict instructions to nursing to continue insulin drip  along with D5, not for glycemic control but for resolution of acidosis.  This took approximately 36 hours before the patient had two consecutive  basic metabolics with a bicarb greater than 20 at which time her nausea and  vomiting symptoms and also resolved.  The patient was then transitioned back  to her home Lantus and continued on NovoLog sliding scale per home and has  had CBGs very well controlled in the 70s to 110s without difficulty. Review  with the patient need for compliance which she says she now understands and  will do regardless of insurance status,  but happily report she will be  reassured by August and looks forward to having this coverage once again. A  prescription has been written for Lantus and NovoLog as well as insulin  syringes which she promises to fill and maintain good control of her  diabetes here forth.   Problem 2:  Hypotension.  Along with DKA, the patient had significant  hypotensive with systolic pressures in the 70s reluctant to improve with  fluid. She was also very tachycardiac. Despite aggressive IV fluid,  eventually dopamine was also begun to help maintain her systolic pressure  greater than 90. To rule out cardiac treatment for her DKA, cardiac enzymes  were checked and found to be elevated. So a 2-D echo was checked to rule out  cardiac event as cause for a DKA and hypertension.  Cardiology consult was  also obtained because of these abnormal cardiac enzymes.  They felt that an  adenosine Cardiolite was in order due to her coronary risk equivalent with  uncontrolled type 1 diabetes. After the resolution of DKA, the patient  underwent an adenosine Cardiolite on July 26 that showed hyperactive LV  function and no ischemic changes.  No further cardiac workup is planned at  this time.  Outpatient follow-up with cardiology as needed.  Her hypotension  did resolve after further aggressive IV fluid and closing of the metabolic  gap of her DKA and she remained normotensive for 48 hours prior to discharge  tolerating p.o. well.   Problem 3:  Bronchitis. As stated, the patient had upper respiratory tract  symptoms with cough and sputum.  She was treated empirically with Rocephin  x48 hours.  Her chest x-ray failed to show any infiltrate or active changes.  Because she was afebrile and, otherwise nontoxic, she was treated  symptomatically with decongestants and antihistamines as needed.   Problem 4:  Mild iron deficiency anemia.  With hydration, the patient's hemoglobin drifted down slowly with a final discharge  level of 10.8.  Iron  studies confirmed that she was slightly iron deficient and she will follow-  up as an outpatient with primary MD.   DISPOSITION:  The patient is discharged home.  She has previously been a  patient of Dr. Sherryl Manges.  Her request to change to [facility],  will request follow up with Dr. Berniece Andreas at the [facility]  office.  The patient said she  would follow primary MD regardless who she was  assigned to.      Rene Paci, M.D. [facility]  Electronically Signed     VL/MEDQ  D:  12/24/2005  T:  12/24/2005  Job:  [id number]

## 2010-10-16 NOTE — Discharge Summary (Signed)
[name], [name]              ACCOUNT NO.:  [id number]   MEDICAL RECORD NO.:  [id number]          PATIENT TYPE:  INP   LOCATION:  5033                         FACILITY:  [facility]   PHYSICIAN:  Valerie A. Felicity Coyer, MDDATE OF BIRTH:  Nov 01, 1945   DATE OF ADMISSION:  09/23/2006  DATE OF DISCHARGE:  09/26/2006                               DISCHARGE SUMMARY   DISCHARGE DIAGNOSIS:  1..  And acute gastroenteritis with nausea and vomiting.  1. Uncontrolled diabetes.  2. Polymicrobial urinary tract infection.  3. Hypothyroid.   HISTORY OF PRESENT ILLNESS:  [name] is a 65 year old female who was  admitted on September 23, 2006, with chief complaint of nausea and vomiting  which was accompanied by fever.  She was admitted for further evaluation  and treatment.   PAST MEDICAL HISTORY:  1. Diabetes, uncontrolled.  2. Hypothyroid.   COURSE OF HOSPITALIZATION:  #1 - ACUTE GASTROENTERITIS.  The the patient  was admitted and was given IV hydration.  She was placed on sliding  scale coverage as well as Lantus insulin.  Sick day rules were reviewed  with the patient.  The patient was tolerating p.o.  She was noted to  have a hemoglobin A1c of 9.9 and normal TSH.   #2 - POLYMICROBIAL UTI.  The patient was treated with Cipro which was  changed to p.o.   #3 - HYPOTHYROID.  The patient was continued on home dose of Synthroid.   MEDICATIONS:  At time of discharge:  1. Synthroid 150 mcg p.o. daily.  2. NovoLog sliding scale insulin q.a.c.  3. Lantus insulin 12 units subcu daily.  4. Cipro 500 mg p.o. b.i.d. times 5 days.   PERTINENT LABORATORY DATA:  At time of discharge, hemoglobin A1c 9.9.  TSH 0.510, hemoglobin 12.2, hematocrit 36.5, BUN 4, creatinine 0.76.   DISPOSITION:  The patient was discharged to home.   FOLLOWUP:  The patient instructed to follow up with Dr. Berniece Andreas in  1-2 weeks and contact the office for an appointment.      Nancy Craze, NP      [name].  Felicity Coyer, MD  Electronically Signed    MO/MEDQ  D:  11/30/2006  T:  12/01/2006  Job:  [id number]

## 2010-10-16 NOTE — H&P (Signed)
[name], [name]              ACCOUNT NO.:  [id number]   MEDICAL RECORD NO.:  [id number]          PATIENT TYPE:  INP   LOCATION:  1823                         FACILITY:  [facility]   PHYSICIAN:  Georgina Quint. Plotnikov, M.D. Romualdo Bolk OF BIRTH:  January 08, 1946   DATE OF ADMISSION:  12/19/2005  DATE OF DISCHARGE:                                HISTORY & PHYSICAL   CHIEF COMPLAINT:  Nausea, vomiting, and weakness.   HISTORY OF PRESENT ILLNESS:  The patient is a 65 year old female, who got  sick three or four days ago with sore throat and cough that eventually  progressed to a productive one with brown sputum.  She has been having  increasing problems with nausea, vomiting, unable to eat for three days,  went to ER yesterday, was hydrated and discharged home, continued to throw  up all night long, presents today feeling worse.  She was found to be in  DKA, and I was called to admit her.   PAST MEDICAL HISTORY:  1.  Type 1 diabetes.  2.  Hypothyroidism.   ALLERGIES:  None.   MEDICINES:  1.  NovoLog sliding scale.  2.  Lantus 14 units daily.  3.  Synthroid unknown dose daily.   SOCIAL HISTORY:  She is a Investment banker, corporate.  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  Does not smoke or drink alcohol.   FAMILY HISTORY:  Negative for diabetes.   REVIEW OF SYSTEMS:  Cough with brown sputum production as above, sore  throat, very weak and unable to walk today.  The rest is as above or  negative.  Complains of a headache today.   PHYSICAL EXAMINATION:  VITAL SIGNS:  Blood pressure 78/56 on admission with  heart rate 124, SATs 99% room air, temp 97.1.  GENERAL:  She appears tired.  HEENT:  With dry mucosa.  NECK:  Supple, no meningeal signs.  LUNGS:  Clear, no wheezes.  HEART:  S1, S2, no gallop, tachycardic.  ABDOMEN:  Soft, sensitive in the epigastric area, no organomegaly, no masses  felt.  EXTREMITIES:  Lower extremities are without edema, calves nontender.  NEURO:  She is alert, oriented, and  cooperative.  SKIN:  Clear without rash.   LABORATORY DATA:  Sodium 126, potassium 5.2, BUN 18, glucose 572, creatinine  1.3.  Other labs per Dr. Donnetta Hail verbal report.   ASSESSMENT AND PLAN:  1.  Diabetic ketoacidosis:  Will start aggressively with intravenous fluids,      will start a Glucomander.  2.  Hypotension:  Corrected with initial bolus, likely related to      dehydration, nitro, watch for fevers.  3.  Dehydration:  Intravenous fluids, monitor her laboratories.  4.  Bronchitis:  Obtain chest x-ray if not done, empirically Rocephin 1 gm      intravenously now and daily.  5.  Nausea, vomiting:  Will use Zofran, will use Protonix.           ______________________________  Georgina Quint Plotnikov, M.D. [facility]     AVP/MEDQ  D:  12/19/2005  T:  12/19/2005  Job:  [id number]   cc:  Tera Mater. Clent Ridges, M.D. [facility]  [address] [place]  [place] [address]   Lacey Jensen

## 2010-10-16 NOTE — Discharge Summary (Signed)
NAMEROSEMOND, LYTTLE              ACCOUNT NO.:  [id number]   MEDICAL RECORD NO.:  [id number]          PATIENT TYPE:  INP   LOCATION:  3314                         FACILITY:  [facility]   PHYSICIAN:  Valerie A. Felicity Coyer, MDDATE OF BIRTH:  06/29/1945   DATE OF ADMISSION:  08/27/2006  DATE OF DISCHARGE:  09/01/2006                               DISCHARGE SUMMARY   DISCHARGE DIAGNOSES:  1. Status post diabetic ketoacidosis, resolved.  2. Late onset type 1 diabetes, brittle.  Hemoglobin A1C 12.1 this      admission indicating uncontrolled diabetes.  3. History of hypothyroidism, continue home Synthroid.  4. Hypokalemia, exacerbated by gastrointestinal loss and acidosis,      replaced and resolved.  5. Nausea and vomiting secondary to #1, resolved, tolerating p.o.      diet.   DISCHARGE MEDICATIONS:  1. Lantus 10 to 12 units subcutaneously q.24h. at 10:00 A.M. daily.  2. NovoLog sliding scale insulin with every meal and at bedtime as      prior to admission.  Please note:  Patient has been educated on,      and reminded of, sick day rules where she is to take one-half of      her usual scheduled insulin (both Lantus and NovoLog) as scheduled,      even if she feels that she is unable to eat due to the need for her      body to have insulin, even if she is not eating.  3. Synthroid 150 mcg once daily.   DISPOSITION:  The patient is discharged home and medically stable and in  improved condition.  She is tolerating p.o.'s and is hemodynamically  stable, asymptomatic, and understands instructions at discharge for  medications and followup.  Hospital followup will be arranged by patient  with her primary care physician, Dr. Berniece Andreas, to call for an  appointment in the next two to three weeks.   HOSPITAL COURSE BY PROBLEMS:  PROBLEM #1:  DIABETIC KETOACIDOSIS:  The  patient is a 65 year old woman with late onset of type 1 diabetes who  has previously been treated for diabetic  ketoacidosis who came to the  emergency room on the day of admission with nausea, vomiting and  weakness.  The nausea and vomiting had begun as a usual gastroenteritis  type illness four days prior to presentation and she was unable to eat  and because of such, she had stopped taking her insulin because she was  afraid of her sugar's going too low.  The nausea and vomiting became  worse and because of fatigue she came to the emergency room for  evaluation.  There, she was found to be in early DKA and was admitted  for insulin drip and treatment.  She was not profoundly acidotic at the  time of presentation and her anion gap corrected quickly.  It is unclear  if patient received her Lantus prior to discontinuation of insulin drip  because 48 hours after presentation, when she was on her Lantus and  sliding scale insulin, she again became acidotic, this time profoundly  so, with a  bicarb of 10 and thus she was resumed on her insulin drip,  despite improvement in her symptoms.  An abdominal ultrasound was  performed due to her nausea and vomiting to rule out other underlying  etiology for her symptoms.  The abdominal ultrasound was unremarkable  for any pathology.  Despite the insulin drip, the patient remained  acidotic with bicarb of 10 the following morning, but again clinically  feeling better.  Her fluids were changed to D5 to be treated with the  insulin drip to ensure adequate amounts of insulin being given and this  did, in fact, finally correct her diabetic ketoacidosis with  normalization of her bicarb at 21 for greater than 24 hours.  It was at  that time that her insulin was discontinued two hours after giving  Lantus 10 units.  The patient has been instructed that Lantus should be  taken every 24 hours and it is acceptable to take this at 10:00 A.M. in  the morning rather than 10:00 P.M. each evening, as long as she is  taking it consistently every 24 hours.  The patient agrees  to do so and  says this will fit her schedule.  I have also encouraged the patient to  continue vigilantly with her NovoLog sliding scale to help better  control her brittle diabetes.  The patient may benefit from referral for  an endocrine evaluation due to the brittle nature of her diabetes and  recurrent DKA, at least two episodes in the past year.  I have also  reviewed with patient so-called sick rules, that it is imperative the  patient take some dose of her insulin and I have recommended half of her  scheduled Lantus and half of her sliding scale insulin direction if she  is feeling sick and unable to eat and drink, reminding her again that  complete abstinence from all insulin will, in fact, trigger her DKA and  make her nausea and vomiting symptoms much worse.  The patient expresses  understanding and agrees to do so, stating this will be the last time  she comes to the hospital for this because she is taking better care of  herself.  I have encouraged her to followup with her primary care  physician, Dr. Berniece Andreas, on these issues to ensure better management  of her diabetes.   PROBLEM #2:  OTHER MEDICAL ISSUES:  The patient's other medical issues  are as listed.  She was continued on her home Synthroid without  problems.  Her TSH was normal.  Her hypokalemia, caused by  gastrointestinal loss and acidosis,  was corrected prior to discharge.      Valerie A. Felicity Coyer, MD  Electronically Signed     VAL/MEDQ  D:  09/01/2006  T:  09/01/2006  Job:  [id number]

## 2010-10-16 NOTE — H&P (Signed)
NAMEIVIONA, HOLE NO.:  [id number]   MEDICAL RECORD NO.:  [id number]          PATIENT TYPE:  INP   LOCATION:  5033                         FACILITY:  [facility]   PHYSICIAN:  Neta Mends. Panosh, MD    DATE OF BIRTH:  05/01/1946   DATE OF ADMISSION:  09/23/2006  DATE OF DISCHARGE:                              HISTORY & PHYSICAL   CHIEF COMPLAINT:  Nausea, vomiting, diarrhea, fever, cannot keep  anything down, has diabetes.   HISTORY OF PRESENT ILLNESS:  Mrs. Wardell is a 65 year old, nonsmoking,  white female with type 1 diabetes and hypothyroidism who is treated with  NovoLog, Lantus, and levothyroxine and simvastatin who 24 or less before  admission developed acute of fever, nausea, vomiting, diarrhea without  blood and according to her daughter, unable to keep anything down.  Her blood sugars earlier in the day were 120 and she was seen in the  office later afternoon when it was 160.  She was unable to keep p.o.  Phenergan or Tylenol and was feeling lethargic and quite tired.  In  March of this year, she was admitted with nausea, vomiting, diarrhea,  dehydration, and DKA.  This physician has not seen her in the office  since last fall as she has had not a chance to follow up in this regard.  According to the patient and daughter, she was perfectly well a day or  two ago.   REVIEW OF SYSTEMS:  Negative for chest pain, shortness of breath, cough,  dysuria, urinary infection symptoms, abdominal pain, focal numbness or  weakness, or unusual rashes.  The rest of the review was negative for  noncontributory.   PAST MEDICAL HISTORY:  1. Type 1 diabetes for at least 10 years.  2. Hypothyroidism.  3. She is gravida 4, para 3.  4. Has had a colonoscopy in the past.   SOCIAL HISTORY:  Negative tobacco or significant alcohol.  Is employed  as an Theatre stage manager.  Does have family in town.   FAMILY HISTORY:  Negative for diabetes.  Positive for high blood  pressure and stroke.   MEDICATIONS:  1. Levothyroxine 0.15.  2. NovoLog t.i.d. and a sliding scale Lantus 12 units.  3. Simvastatin 40 mg.   DRUG ALLERGIES:  None, but LIPITOR causes myalgias.   OBJECTIVE:  VITAL SIGNS:  Unobtainable today.  Temperature 102.4, pulse  90 and regular, blood pressure 100/70.  GENERAL:  This is a well-developed, moderately ill-appearing white  female in no acute distress who appears to be somewhat lethargic but  responsive without hyperventilation, in no acute distress.  HEENT:  Normocephalic, atraumatic.  Nares patent.  Tympanic membranes  are clear.  Oropharynx clear.  NECK:  Without masses or thyromegaly.  There is no meningismus.  CHEST:  Clear to auscultation.  Breath sounds equal.  CARDIAC:  S1 and S2.  No gallops or murmurs are heard.  Peripheral  pulses are present without delay.  Negative CCE.  ABDOMEN:  No obvious organomegaly, masses, guarding, or rebound.  EXTREMITIES:  No focal atrophy.  NEUROLOGIC:  No focal findings.  SKIN:  Slightly cool extremities.  No rashes.   IMPRESSION:  Acute vomiting, diarrhea, dehydration, and type 1 diabetic  with recent history of diabetic ketoacidosis along with significant  fever.  Will admit her for IV hydration, fever evaluation.  Other  evaluation as appropriate for after her admitting laboratory studies are  obtained.      Neta Mends. Panosh, MD  Electronically Signed     WKP/MEDQ  D:  09/23/2006  T:  09/23/2006  Job:  [id number]

## 2010-10-16 NOTE — Assessment & Plan Note (Signed)
[facility]                                 ON-CALL NOTE   [name], [name]                       MRN:          [id number]  DATE:08/27/2006                            DOB:          04/09/1946    Patient of Dr. Fabian Sharp, 8:21 a.m.  Caller is Westley Foots, her  daughter.  Phone number [phone].   CHIEF COMPLAINT:  Cannot keep anything down, diabetic.  Patient states  her mother is a diabetic.  She has been vomiting for 2 days with fever  and diarrhea.  Her sugars are up and down.  She has been trying to give  her popsicles and ginger ale, but she cannot keep anything down.  This  happened to her last year and she ended up in the hospital for a week.  I told her to go ahead and take her to the emergency room for evaluation  now.     Marne A. Tower, MD  Electronically Signed    MAT/MedQ  DD: 08/27/2006  DT: 08/27/2006  Job #: [id number]   cc:   Neta Mends. Fabian Sharp, MD

## 2010-10-16 NOTE — H&P (Signed)
[name], [name]              ACCOUNT NO.:  [id number]   MEDICAL RECORD NO.:  [id number]          PATIENT TYPE:  INP   LOCATION:  3314                         FACILITY[facility]  [facility]   PHYSICIAN:  Georgina Quint. Plotnikov, MDDATE OF BIRTH:  30-Aug-1945   DATE OF ADMISSION:  08/27/2006  DATE OF DISCHARGE:                              HISTORY & PHYSICAL   CONTINUATION   MEDICATIONS:  1. Lantus 12 units daily.  2. NovoLog sliding scale.  3. Synthroid - unknown dose.   ALLERGIES:  NONE.   PAST MEDICAL HISTORY:  1. Type 1 diabetes, onset 65 years old, insulin dependent.  2. Hypothyroidism.   SOCIAL HISTORY:  She is an apartment Dealer.  Does not smoke.  She has three kids, lives with daughter.  Alcohol - rare.   FAMILY HISTORY:  Negative for diabetes.   REVIEW OF SYSTEMS:  She has been menopausal.  No chest pain.  No  shortness of breath.  No syncopal spells.  GI complaints as above.  The  rest of the 18-point review of system is negative.  She did have a  severe headache on Thursday, which now is much better.   PHYSICAL EXAMINATION:  VITAL SIGNS:  Temperature 98.0, blood pressure  111/74, heart rate 107, sats 100% on room air.  GENERAL:  She looks tired.  No pallor.  HEENT:  With dry mucosa.  No thrush.  NECK:  Supple.  No thyromegaly or bruit.  No meningeal signs.  LUNGS:  Clear.  No wheezes or rales.  HEART:  S1, S2.  Tachycardiac.  ABDOMEN:  Soft, sensitive throughout with no masses felt.  RECTAL:  Not done.  LOWER EXTREMITIES:  Without edema.  Calves nontender.  NEUROLOGIC:  She alert, oriented and cooperative.  Cranial nerves II-XII  nonfocal.   LABS:  Sodium 127, potassium 5.7, BUN 21, bicarb 16.1, acid base  deficiency 10, creatinine 0.9.  White count 6.7, hemoglobin 15.7.  Urinalysis with 3-6 WBCs.   ASSESSMENT AND PLAN:  1. Diabetic ketoacidosis.  We will treat with IV fluids, Glucomander      protocol.  2. Dehydration.  We will give two liters of normal  saline now.  3. Nausea and vomiting, likely viral gastroenteritis.  We will use      symptomatic treatment.  4. Diarrhea.  Related to previous problem.  We will use Lomotil.  5. Insulin-dependent diabetes.  Obtain hemoglobin A1c.  Upon talking      to her it sounds like she has some difficulties managing her      diabetes.  We will obtain diabetic teaching consult.  6. Hypothyroidism.  Obtain TSH.  Continue current therapy.  7. Hyponatremia.  We will correct with IV fluids.      Georgina Quint. Plotnikov, MD  Electronically Signed     AVP/MEDQ  D:  08/27/2006  T:  08/28/2006  Job:  [id number]   cc:   Neta Mends. Fabian Sharp, MD

## 2011-03-02 LAB — COMPREHENSIVE METABOLIC PANEL
ALT: 17 U/L (ref 0–35)
ALT: 21 U/L (ref 0–35)
AST: 25 U/L (ref 0–37)
Albumin: 3.1 g/dL — ABNORMAL LOW (ref 3.5–5.2)
Alkaline Phosphatase: 74 U/L (ref 39–117)
Alkaline Phosphatase: 77 U/L (ref 39–117)
BUN: 11 mg/dL (ref 6–23)
CO2: 24 mEq/L (ref 19–32)
Chloride: 101 mEq/L (ref 96–112)
GFR calc Af Amer: >60 mL/min (ref >60)
GFR calc non Af Amer: 55 mL/min — ABNORMAL LOW (ref >60)
Glucose, Bld: 104 mg/dL — ABNORMAL HIGH (ref 70–99)
Glucose, Bld: 170 mg/dL — ABNORMAL HIGH (ref 70–99)
Potassium: 3.8 mEq/L (ref 3.5–5.1)
Potassium: 4.3 mEq/L (ref 3.5–5.1)
Sodium: 132 mEq/L — ABNORMAL LOW (ref 135–145)
Sodium: 134 mEq/L — ABNORMAL LOW (ref 135–145)
Total Bilirubin: 0.7 mg/dL (ref 0.3–1.2)
Total Protein: 6.1 g/dL (ref 6.0–8.3)

## 2011-03-02 LAB — BASIC METABOLIC PANEL
BUN: 2 mg/dL — ABNORMAL LOW (ref 6–23)
CO2: 20 mEq/L (ref 19–32)
Calcium: 9.1 mg/dL (ref 8.4–10.5)
Chloride: 112 mEq/L (ref 96–112)
Creatinine, Ser: 0.91 mg/dL (ref 0.4–1.2)
GFR calc Af Amer: >60 mL/min (ref >60)
GFR calc non Af Amer: >60 mL/min (ref >60)
GFR calc non Af Amer: >60 mL/min (ref >60)
Glucose, Bld: 188 mg/dL — ABNORMAL HIGH (ref 70–99)
Potassium: 3.9 mEq/L (ref 3.5–5.1)
Sodium: 139 mEq/L (ref 135–145)
Sodium: 139 mEq/L (ref 135–145)

## 2011-03-02 LAB — GLUCOSE, CAPILLARY
Glucose-Capillary: 129 mg/dL — ABNORMAL HIGH (ref 70–99)
Glucose-Capillary: 132 mg/dL — ABNORMAL HIGH (ref 70–99)
Glucose-Capillary: 134 mg/dL — ABNORMAL HIGH (ref 70–99)
Glucose-Capillary: 134 mg/dL — ABNORMAL HIGH (ref 70–99)
Glucose-Capillary: 162 mg/dL — ABNORMAL HIGH (ref 70–99)
Glucose-Capillary: 167 mg/dL — ABNORMAL HIGH (ref 70–99)
Glucose-Capillary: 175 mg/dL — ABNORMAL HIGH (ref 70–99)
Glucose-Capillary: 290 mg/dL — ABNORMAL HIGH (ref 70–99)

## 2011-03-02 LAB — URINALYSIS, ROUTINE W REFLEX MICROSCOPIC
Ketones, ur: NEGATIVE mg/dL
Nitrite: NEGATIVE
Protein, ur: NEGATIVE mg/dL
Urobilinogen, UA: 0.2 mg/dL (ref 0.0–1.0)
pH: 5.5 (ref 5.0–8.0)

## 2011-03-02 LAB — CBC
HCT: 31.5 % — ABNORMAL LOW (ref 36.0–46.0)
HCT: 38.5 % (ref 36.0–46.0)
Hemoglobin: 10.8 g/dL — ABNORMAL LOW (ref 12.0–15.0)
Hemoglobin: 13.3 g/dL (ref 12.0–15.0)
Hemoglobin: 13.5 g/dL (ref 12.0–15.0)
MCHC: 34.1 g/dL (ref 30.0–36.0)
MCV: 86.8 fL (ref 78.0–100.0)
Platelets: 176 10*3/uL (ref 150–400)
Platelets: 219 10*3/uL (ref 150–400)
RBC: 4.61 MIL/uL (ref 3.87–5.11)
RDW: 12.9 % (ref 11.5–15.5)
WBC: 7.2 10*3/uL (ref 4.0–10.5)

## 2011-03-02 LAB — DIFFERENTIAL
Basophils Relative: 0 % (ref 0–1)
Eosinophils Absolute: 0.1 10*3/uL (ref 0.0–0.7)
Eosinophils Relative: 1 % (ref 0–5)
Lymphs Abs: 1.4 10*3/uL (ref 0.7–4.0)
Monocytes Relative: 5 % (ref 3–12)
Neutrophils Relative %: 80 % — ABNORMAL HIGH (ref 43–77)

## 2011-03-02 LAB — HEMOGLOBIN A1C: Mean Plasma Glucose: 306 mg/dL

## 2011-03-02 LAB — CULTURE, BLOOD (ROUTINE X 2): Culture: NO GROWTH

## 2011-03-02 LAB — APTT: aPTT: 27 seconds (ref 24–37)

## 2011-03-02 LAB — URINE CULTURE
Colony Count: >=100000
Special Requests: NEGATIVE

## 2011-03-02 LAB — TSH: TSH: 10.61 u[IU]/mL — ABNORMAL HIGH (ref 0.350–4.500)

## 2011-04-21 ENCOUNTER — Telehealth: Payer: Self-pay | Admitting: *Deleted

## 2011-04-21 NOTE — Telephone Encounter (Signed)
Per MD, pt is due for F/U OV. (Last OV 09/2009) Pt states that she is no longer on diabetic pump since loss of employment because she can no longer afford her pump supplies.

## 2012-03-14 ENCOUNTER — Encounter: Payer: Self-pay | Admitting: Family Medicine

## 2012-03-14 ENCOUNTER — Ambulatory Visit (INDEPENDENT_AMBULATORY_CARE_PROVIDER_SITE_OTHER): Payer: Medicare Other | Admitting: Family Medicine

## 2012-03-14 VITALS — BP 118/66 | HR 88 | Temp 98.0°F | Ht 61.0 in | Wt 127.5 lb

## 2012-03-14 DIAGNOSIS — R569 Unspecified convulsions: Secondary | ICD-10-CM

## 2012-03-14 DIAGNOSIS — W06XXXA Fall from bed, initial encounter: Secondary | ICD-10-CM | POA: Insufficient documentation

## 2012-03-14 DIAGNOSIS — Z23 Encounter for immunization: Secondary | ICD-10-CM

## 2012-03-14 DIAGNOSIS — E109 Type 1 diabetes mellitus without complications: Secondary | ICD-10-CM

## 2012-03-14 DIAGNOSIS — Z9181 History of falling: Secondary | ICD-10-CM

## 2012-03-14 DIAGNOSIS — E039 Hypothyroidism, unspecified: Secondary | ICD-10-CM

## 2012-03-14 LAB — CBC WITH DIFFERENTIAL/PLATELET
Basophils Relative: 0.4 % (ref 0.0–3.0)
Eosinophils Relative: 7 % — ABNORMAL HIGH (ref 0.0–5.0)
HCT: 33 % — ABNORMAL LOW (ref 36.0–46.0)
Hemoglobin: 10.9 g/dL — ABNORMAL LOW (ref 12.0–15.0)
Lymphocytes Relative: 45.2 % (ref 12.0–46.0)
Lymphs Abs: 2.7 10*3/uL (ref 0.7–4.0)
Monocytes Relative: 6.4 % (ref 3.0–12.0)
Neutro Abs: 2.5 10*3/uL (ref 1.4–7.7)
RBC: 3.72 Mil/uL — ABNORMAL LOW (ref 3.87–5.11)
RDW: 15.5 % — ABNORMAL HIGH (ref 11.5–14.6)

## 2012-03-14 LAB — TSH: TSH: <0.01 u[IU]/mL — ABNORMAL LOW (ref 0.35–5.50)

## 2012-03-14 LAB — COMPREHENSIVE METABOLIC PANEL
Alkaline Phosphatase: 82 U/L (ref 39–117)
CO2: 26 mEq/L (ref 19–32)
Creatinine, Ser: 1 mg/dL (ref 0.4–1.2)
GFR: 59.82 mL/min — ABNORMAL LOW (ref >60.00)
Glucose, Bld: 159 mg/dL — ABNORMAL HIGH (ref 70–99)
Sodium: 134 mEq/L — ABNORMAL LOW (ref 135–145)
Total Bilirubin: 0.6 mg/dL (ref 0.3–1.2)
Total Protein: 7 g/dL (ref 6.0–8.3)

## 2012-03-14 MED ORDER — LEVOTHYROXINE SODIUM 175 MCG PO TABS: 175.0000 ug | ORAL_TABLET | Freq: Every day | ORAL | Status: DC

## 2012-03-14 NOTE — Assessment & Plan Note (Addendum)
Some brittle diabetes.  Noted weight loss - may just require lower insulin doses. Given concern for hypoglycemic episodes in am, will decrease PM NPH to 5u and dinner mealtime correction to 2-3 u regular daily. Check blood work today. Refer to endo locally to establish for T1DM.

## 2012-03-14 NOTE — Assessment & Plan Note (Signed)
Unclear etiology.  Concern for seizure type events with post ictal period, possibly worsened by hypoglycemia.  See above for hypoglycemia plan. Will request records on abnormal brain imaging, refer to neurology for further eval of possible seizure episodes.

## 2012-03-14 NOTE — Patient Instructions (Addendum)
Flu shot today. Good to see you today, call us with questions. Let's decrease NPH to 5 u at night.  Keep an eye on sugars closely over next few weeks to ensure no more lows. decrease regular at night to 2-3 units with meals. Blood work today.

## 2012-03-14 NOTE — Progress Notes (Signed)
Subjective:    Patient ID: [name], female    DOB: 02-13-1946, 66 y.o.   MRN: [id number]  HPI CC: new pt to establish  Last endocrinologist was Dr. Lodema Hong in [facility].  Last saw her about 6 mo ago.  Prior [facility]  Currently lives with younger sister.  Falls from bed - For last 2 years, having episodes where she falls out of bed, have happened several times in last few months.  Fall does not wake her, but later on wakes up on floor and is unable to move.  Feels confused when awakening, trouble walking after this.  States had evaluation by neurologist about 33mo to 14yr ago where a lesion in her brain was found (Dr. Mikal Plane?).  Becoming more frequent, now about once a month.  Last episode was yesterday, 2 episodes this week.  Prior episode was 2 wks ago.    Daughter has found her on floor.  These episodes have never been witnessed.  No biting of tongue, no bowel/bladder accidents with this.  Denies fevers/chills, slurred speech, one sided weakness, dizziness.  Yesterday called EMS 2/2 hypoglycemia - cbg to 40s when it was checked after latest episode.  Wt Readings from Last 3 Encounters:  03/14/12 127 lb 8 oz (57.834 kg)  01/02/10 138 lb (62.596 kg)  10/13/09 141 lb 6.1 oz (64.13 kg)  noted weight loss over months.  T1DM - sugars sound brittle.  Last night 300s.  Over last few weeks has been having lows in early AMs - 3-7am.  H/o diabetic foot ulcer, not recently. Lab Results  Component Value Date   HGBA1C  Value: 10.8 (NOTE)                                                                       According to the ADA Clinical Practice Recommendations for 2011, when HbA1c is used as a screening test:   >=6.5%   Diagnostic of Diabetes Mellitus           (if abnormal result  is confirmed)  5.7-6.4%   Increased risk of developing Diabetes Mellitus  References:Diagnosis and Classification of Diabetes Mellitus,Diabetes Care,2011,34(Suppl 1):S62-S69 and Standards of  Medical Care in         Diabetes - 2011,Diabetes Care,2011,34  (Suppl 1):S11-S61.* 01/03/2010   BP Readings from Last 3 Encounters:  03/14/12 118/66  01/02/10 100/60  10/13/09 110/70    Medications and allergies reviewed and updated in chart.  Past histories reviewed and updated if relevant as below. Patient Active Problem List  Diagnosis  . VIRAL INFECTION  . HYPOTHYROIDISM  . DIABETES MELLITUS, TYPE I  . DIABETES MELLITUS, WITH KETOACIDOSIS  . HYPERLIPIDEMIA  . INFLUENZA WITH OTHER RESPIRATORY MANIFESTATIONS  . GASTRITIS  . MYALGIA  . EDEMA, HANDS  . NAUSEA AND VOMITING  . DIARRHEA  . URINALYSIS, ABNORMAL   Past Medical History  Diagnosis Date  . Diabetes mellitus type 1     (prior saw Dr. Lodema Hong endo, would like to establish locally)  . Generalized headaches     frequent  . Hypothyroidism   . Convulsions/seizures     unknown type  . History of chicken pox    Past Surgical History  Procedure Date  . Cesarean section 1983   History  Substance Use Topics  . Smoking status: Never Smoker   . Smokeless tobacco: Never Used  . Alcohol Use: Yes     Socially (on weekends)   Family History  Problem Relation Age of Onset  . Stroke Mother   . Hypertension Mother   . Hypertension Father   . Coronary artery disease Father 43    MI  . Hypertension Sister   . Diabetes Cousin     type 1  . Cancer Neg Hx   . Hyperlipidemia Brother    No Known Allergies Current Outpatient Prescriptions on File Prior to Visit  Medication Sig Dispense Refill  . insulin NPH (HUMULIN N,NOVOLIN N) 100 UNIT/ML injection Inject into the skin 2 (two) times daily. 10 units in AM and 7 units in PM      . insulin regular (NOVOLIN R,HUMULIN R) 100 units/mL injection Inject into the skin as directed. 3 units with breakfast; 2 units at lunch and 3-4 units with supper      . levothyroxine (SYNTHROID, LEVOTHROID) 175 MCG tablet Take 1 tablet (175 mcg total) by mouth daily.  90 tablet  3     Review  of Systems  Constitutional: Positive for unexpected weight change. Negative for fever, chills, activity change, appetite change and fatigue.  HENT: Negative for hearing loss and neck pain.   Eyes: Negative for visual disturbance.  Respiratory: Negative for cough, chest tightness, shortness of breath and wheezing.   Cardiovascular: Negative for chest pain, palpitations and leg swelling.  Gastrointestinal: Positive for diarrhea. Negative for nausea, vomiting, abdominal pain, constipation, blood in stool and abdominal distention.  Genitourinary: Negative for hematuria and difficulty urinating.  Musculoskeletal: Negative for myalgias and arthralgias.  Skin: Negative for rash.  Neurological: Positive for syncope and headaches. Negative for dizziness and seizures.  Hematological: Does not bruise/bleed easily.  Psychiatric/Behavioral: Negative for dysphoric mood. The patient is not nervous/anxious.        Objective:   Physical Exam  Nursing note and vitals reviewed. Constitutional: She is oriented to person, place, and time. She appears well-developed and well-nourished. No distress.  HENT:  Head: Normocephalic and atraumatic.  Right Ear: Hearing, tympanic membrane, external ear and ear canal normal.  Left Ear: Hearing, tympanic membrane, external ear and ear canal normal.  Nose: Nose normal.  Mouth/Throat: Oropharynx is clear and moist. No oropharyngeal exudate.  Eyes: Conjunctivae normal and EOM are normal. Pupils are equal, round, and reactive to light. No scleral icterus.  Neck: Normal range of motion. Neck supple. Carotid bruit is not present.  Cardiovascular: Normal rate, regular rhythm, normal heart sounds and intact distal pulses.   No murmur heard. Pulses:      Radial pulses are 2+ on the right side, and 2+ on the left side.  Pulmonary/Chest: Effort normal and breath sounds normal. No respiratory distress. She has no wheezes. She has no rales.  Abdominal: Soft. Bowel sounds are  normal. She exhibits no distension and no mass. There is no tenderness. There is no rebound and no guarding.  Musculoskeletal: Normal range of motion. She exhibits no edema.       Diabetic foot exam: Normal inspection No skin breakdown No calluses  Normal DP/PT pulses Normal sensation to light touch and monofilament Nails normal  Lymphadenopathy:    She has no cervical adenopathy.  Neurological: She is alert and oriented to person, place, and time.       CN grossly intact,  station and gait intact  Skin: Skin is warm and dry. No rash noted.  Psychiatric: She has a normal mood and affect. Her behavior is normal. Judgment and thought content normal.       Assessment & Plan:

## 2012-03-14 NOTE — Assessment & Plan Note (Signed)
Chronic, has been on synthroid daily.  Check TSH today.

## 2012-03-15 ENCOUNTER — Other Ambulatory Visit: Payer: Self-pay | Admitting: Family Medicine

## 2012-03-15 MED ORDER — LEVOTHYROXINE SODIUM 150 MCG PO TABS: 150.0000 ug | ORAL_TABLET | Freq: Every day | ORAL | Status: DC

## 2012-03-16 ENCOUNTER — Telehealth: Payer: Self-pay

## 2012-03-16 NOTE — Telephone Encounter (Signed)
Pt request copy of CT scan done 07/2009. Pt transferred to Terri in xray.

## 2012-03-30 ENCOUNTER — Ambulatory Visit: Payer: Self-pay | Admitting: Neurology

## 2012-03-31 DIAGNOSIS — E538 Deficiency of other specified B group vitamins: Secondary | ICD-10-CM

## 2012-03-31 HISTORY — DX: Deficiency of other specified B group vitamins: E53.8

## 2012-04-14 ENCOUNTER — Ambulatory Visit: Payer: Medicare Other | Admitting: Family Medicine

## 2012-04-18 ENCOUNTER — Encounter: Payer: Self-pay | Admitting: Family Medicine

## 2012-04-18 ENCOUNTER — Ambulatory Visit (INDEPENDENT_AMBULATORY_CARE_PROVIDER_SITE_OTHER): Payer: Medicare Other | Admitting: Family Medicine

## 2012-04-18 VITALS — BP 112/70 | HR 80 | Temp 98.0°F | Wt 128.8 lb

## 2012-04-18 DIAGNOSIS — G4762 Sleep related leg cramps: Secondary | ICD-10-CM | POA: Insufficient documentation

## 2012-04-18 DIAGNOSIS — Z79899 Other long term (current) drug therapy: Secondary | ICD-10-CM

## 2012-04-18 DIAGNOSIS — E109 Type 1 diabetes mellitus without complications: Secondary | ICD-10-CM

## 2012-04-18 DIAGNOSIS — D649 Anemia, unspecified: Secondary | ICD-10-CM | POA: Insufficient documentation

## 2012-04-18 DIAGNOSIS — E039 Hypothyroidism, unspecified: Secondary | ICD-10-CM

## 2012-04-18 NOTE — Assessment & Plan Note (Signed)
Recheck TSH today, titrate accordingly.

## 2012-04-18 NOTE — Assessment & Plan Note (Signed)
Further w/u with blood work today (iron panel, B12, folate)

## 2012-04-18 NOTE — Progress Notes (Signed)
Subjective:    Patient ID: [name], female    DOB: [birth year], 66 y.o.   MRN: [id number]  HPI CC: 1 mo f/u  No records received yet from prior PCPs.  T1DM - Last night had low of 40.  Called EMS.  Has only had a few lows in the last month.  Saw Dr. Tedd Sias this morning.  See updated med list for changes.  Changed from NPH to lantus 11 units and started on novolog mealtime correction with sliding scale.  Has been prescribed glucagon.  Eye exam - due. Denies paresthesias.  Denies chest pain/tightness, SOB.  Has seen neurologist recently (Dr. Malvin Johns at [facility]).  Has f/u planned with neurology.  Unclear source of convulsions/seizures.  S/p normal EEG per pt.  Worsening nocturnal leg cramping.  Starts in foot, travels up ankle.  Has been told has low K in past.  Medications and allergies reviewed and updated in chart.  Past histories reviewed and updated if relevant as below. Patient Active Problem List  Diagnosis  . HYPOTHYROIDISM  . DIABETES MELLITUS, TYPE I  . DIABETES MELLITUS, WITH KETOACIDOSIS  . HYPERLIPIDEMIA  . EDEMA, HANDS  . Fall from bed   Past Medical History  Diagnosis Date  . Diabetes mellitus type 1     (prior saw Dr. Lodema Hong endo, would like to establish locally)  . Generalized headaches     frequent  . Hypothyroidism   . Convulsions/seizures     unknown type  . History of chicken pox    Past Surgical History  Procedure Date  . Cesarean section 1983   History  Substance Use Topics  . Smoking status: Never Smoker   . Smokeless tobacco: Never Used  . Alcohol Use: Yes     Comment: Socially (on weekends)   Family History  Problem Relation Age of Onset  . Stroke Mother   . Hypertension Mother   . Hypertension Father   . Coronary artery disease Father 6    MI  . Hypertension Sister   . Diabetes Cousin     type 1  . Cancer Neg Hx   . Hyperlipidemia Brother    No Known Allergies Current Outpatient Prescriptions on File Prior to Visit   Medication Sig Dispense Refill  . insulin aspart (NOVOLOG PENFILL) 100 UNIT/ML injection Inject 5 Units into the skin 3 (three) times daily before meals. 5 units before meals, 2 before snaks, and sliding scale as directed (150-200 1, 201-250 3, 251-300 5, 301-350 7, >351 10)      . insulin glargine (LANTUS SOLOSTAR) 100 UNIT/ML injection Inject 17 Units into the skin at bedtime.      Marland Kitchen levothyroxine (SYNTHROID, LEVOTHROID) 150 MCG tablet Take 1 tablet (150 mcg total) by mouth daily.  30 tablet  6    Review of Systems Per HPI    Objective:   Physical Exam  Nursing note and vitals reviewed. Constitutional: She appears well-developed and well-nourished. No distress.  HENT:  Head: Normocephalic and atraumatic.  Mouth/Throat: Oropharynx is clear and moist. No oropharyngeal exudate.  Eyes: Conjunctivae normal and EOM are normal. Pupils are equal, round, and reactive to light. No scleral icterus.  Neck: Normal range of motion. Neck supple.  Cardiovascular: Normal rate, regular rhythm, normal heart sounds and intact distal pulses.   No murmur heard. Pulmonary/Chest: Effort normal and breath sounds normal. No respiratory distress. She has no wheezes. She has no rales.  Musculoskeletal: She exhibits no edema.  Skin: Skin is warm  and dry. No rash noted.  Psychiatric: She has a normal mood and affect.       Assessment & Plan:

## 2012-04-18 NOTE — Assessment & Plan Note (Signed)
Check CPK and K today. Discussed tonic water.

## 2012-04-18 NOTE — Patient Instructions (Addendum)
Good to see you today, call us with questions. Schedule eye exam - look into [facility]. Blood work today. Return in 2-3 months for physical.

## 2012-04-18 NOTE — Assessment & Plan Note (Signed)
Continued hypoglycemic episodes. Has been prescribed glucagon by endo. Appreciate endo care of pt.

## 2012-04-19 LAB — IBC PANEL
Saturation Ratios: 11 % — ABNORMAL LOW (ref 20.0–50.0)
Transferrin: 259.1 mg/dL (ref 212.0–360.0)

## 2012-04-19 LAB — POTASSIUM: Potassium: 5.3 mEq/L — ABNORMAL HIGH (ref 3.5–5.1)

## 2012-04-19 LAB — TSH: TSH: 0.01 u[IU]/mL — ABNORMAL LOW (ref 0.35–5.50)

## 2012-04-21 ENCOUNTER — Other Ambulatory Visit: Payer: Self-pay | Admitting: Family Medicine

## 2012-04-21 DIAGNOSIS — E876 Hypokalemia: Secondary | ICD-10-CM

## 2012-04-21 DIAGNOSIS — G4762 Sleep related leg cramps: Secondary | ICD-10-CM

## 2012-04-21 DIAGNOSIS — E039 Hypothyroidism, unspecified: Secondary | ICD-10-CM

## 2012-04-21 MED ORDER — LEVOTHYROXINE SODIUM 125 MCG PO TABS: 125.0000 ug | ORAL_TABLET | Freq: Every day | ORAL | Status: DC

## 2012-04-24 ENCOUNTER — Other Ambulatory Visit (INDEPENDENT_AMBULATORY_CARE_PROVIDER_SITE_OTHER): Payer: Medicare Other

## 2012-04-24 DIAGNOSIS — G4762 Sleep related leg cramps: Secondary | ICD-10-CM

## 2012-04-24 DIAGNOSIS — E876 Hypokalemia: Secondary | ICD-10-CM

## 2012-04-24 LAB — BASIC METABOLIC PANEL
CO2: 25 mEq/L (ref 19–32)
Calcium: 8.8 mg/dL (ref 8.4–10.5)
Creatinine, Ser: 1 mg/dL (ref 0.4–1.2)

## 2012-04-24 LAB — CK: Total CK: 150 U/L (ref 7–177)

## 2012-05-03 ENCOUNTER — Ambulatory Visit: Payer: Medicare Other

## 2012-05-16 ENCOUNTER — Ambulatory Visit: Payer: Self-pay | Admitting: Family Medicine

## 2012-06-11 ENCOUNTER — Other Ambulatory Visit: Payer: Self-pay | Admitting: Family Medicine

## 2012-06-11 DIAGNOSIS — E538 Deficiency of other specified B group vitamins: Secondary | ICD-10-CM

## 2012-06-11 DIAGNOSIS — D649 Anemia, unspecified: Secondary | ICD-10-CM

## 2012-06-11 DIAGNOSIS — E039 Hypothyroidism, unspecified: Secondary | ICD-10-CM

## 2012-06-11 DIAGNOSIS — E782 Mixed hyperlipidemia: Secondary | ICD-10-CM

## 2012-06-11 DIAGNOSIS — E109 Type 1 diabetes mellitus without complications: Secondary | ICD-10-CM

## 2012-06-13 ENCOUNTER — Other Ambulatory Visit (INDEPENDENT_AMBULATORY_CARE_PROVIDER_SITE_OTHER): Payer: Medicare Other

## 2012-06-13 DIAGNOSIS — D649 Anemia, unspecified: Secondary | ICD-10-CM

## 2012-06-13 DIAGNOSIS — E039 Hypothyroidism, unspecified: Secondary | ICD-10-CM

## 2012-06-13 DIAGNOSIS — E109 Type 1 diabetes mellitus without complications: Secondary | ICD-10-CM

## 2012-06-13 DIAGNOSIS — E782 Mixed hyperlipidemia: Secondary | ICD-10-CM

## 2012-06-13 DIAGNOSIS — E538 Deficiency of other specified B group vitamins: Secondary | ICD-10-CM

## 2012-06-13 LAB — LIPID PANEL
LDL Cholesterol: 133 mg/dL — ABNORMAL HIGH (ref 0–99)
Total CHOL/HDL Ratio: 4

## 2012-06-13 LAB — BASIC METABOLIC PANEL
BUN: 14 mg/dL (ref 6–23)
Calcium: 9.1 mg/dL (ref 8.4–10.5)
Creatinine, Ser: 0.9 mg/dL (ref 0.4–1.2)
GFR: 69.38 mL/min (ref >60.00)
Potassium: 4.1 mEq/L (ref 3.5–5.1)

## 2012-06-13 LAB — CBC WITH DIFFERENTIAL/PLATELET
Eosinophils Relative: 6.1 % — ABNORMAL HIGH (ref 0.0–5.0)
HCT: 34.2 % — ABNORMAL LOW (ref 36.0–46.0)
Lymphs Abs: 2.9 10*3/uL (ref 0.7–4.0)
MCV: 87 fl (ref 78.0–100.0)
Monocytes Absolute: 0.4 10*3/uL (ref 0.1–1.0)
Platelets: 199 10*3/uL (ref 150.0–400.0)
WBC: 6.4 10*3/uL (ref 4.5–10.5)

## 2012-06-13 LAB — MICROALBUMIN / CREATININE URINE RATIO
Creatinine,U: 106.3 mg/dL
Microalb, Ur: 0.8 mg/dL (ref 0.0–1.9)

## 2012-06-19 ENCOUNTER — Ambulatory Visit (INDEPENDENT_AMBULATORY_CARE_PROVIDER_SITE_OTHER): Payer: Medicare Other | Admitting: Family Medicine

## 2012-06-19 ENCOUNTER — Encounter: Payer: Self-pay | Admitting: Family Medicine

## 2012-06-19 VITALS — BP 130/80 | HR 84 | Temp 98.0°F | Ht 61.0 in | Wt 122.5 lb

## 2012-06-19 DIAGNOSIS — E782 Mixed hyperlipidemia: Secondary | ICD-10-CM

## 2012-06-19 DIAGNOSIS — E538 Deficiency of other specified B group vitamins: Secondary | ICD-10-CM

## 2012-06-19 DIAGNOSIS — Z0000 Encounter for general adult medical examination without abnormal findings: Secondary | ICD-10-CM

## 2012-06-19 DIAGNOSIS — D649 Anemia, unspecified: Secondary | ICD-10-CM

## 2012-06-19 DIAGNOSIS — N63 Unspecified lump in unspecified breast: Secondary | ICD-10-CM

## 2012-06-19 DIAGNOSIS — E039 Hypothyroidism, unspecified: Secondary | ICD-10-CM

## 2012-06-19 DIAGNOSIS — N6320 Unspecified lump in the left breast, unspecified quadrant: Secondary | ICD-10-CM | POA: Insufficient documentation

## 2012-06-19 DIAGNOSIS — Z1211 Encounter for screening for malignant neoplasm of colon: Secondary | ICD-10-CM

## 2012-06-19 DIAGNOSIS — Z23 Encounter for immunization: Secondary | ICD-10-CM

## 2012-06-19 DIAGNOSIS — E109 Type 1 diabetes mellitus without complications: Secondary | ICD-10-CM

## 2012-06-19 MED ORDER — LEVOTHYROXINE SODIUM 100 MCG PO TABS: 100.0000 ug | ORAL_TABLET | Freq: Every day | ORAL | Status: DC

## 2012-06-19 MED ORDER — CYANOCOBALAMIN 1000 MCG/ML IJ SOLN
1000.0000 ug | Freq: Once | INTRAMUSCULAR | Status: AC
  Administered 2012-06-19: 1000 ug via INTRAMUSCULAR

## 2012-06-19 NOTE — Progress Notes (Signed)
Subjective:    Patient ID: [name], female    DOB: [birth year], 67 y.o.   MRN: [id number]  HPI CC: medicare wellness visit  [name] is a pleasant 67 yo Type 1 diabetic followed by Dr. Tedd Sias with h/o hypothyroidism, HLD. Has had medicare for last 2 years (started around 03/2011).  T1DM - sugars have been elevated over holidays, then had head cold which also raised sugars.  Regularly seeing Dr. Tedd Sias.    Vit B12 deficiency - forgot to start injections.  To start today.  Noted anemia on last blood work.  Hypothyroid - on , TSH still low.  Will drop to daily.  Sent in new dose.  HLD - not on meds.  Tries to have healthy diet.  Preventative: Colon cancer screening - stool kit.  No fmhx colon cancer, no blood in stool. Mammogram - had done several years ago, due for repeat. Pap smear/well woman - none recently.  Has yeast infection so would like to defer pap today.  White discharge, itching.  Using monistat which is helping. Flu shot 02/2012. Td - 2008. Discussed shingles shot. Pneumovax - given by Dr. Fabian Sharp in past, thinks due for rpt - will receive today. Advanced directives: would like packet of info.  Has spoken with daughters about this.  Doesn't want prolonged life support.  Passed hearing and vision screens. 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  Past histories reviewed and updated if relevant as below. Patient Active Problem List  Diagnosis  . HYPOTHYROIDISM  . DIABETES MELLITUS, TYPE I  . DIABETES MELLITUS, WITH KETOACIDOSIS  . HYPERLIPIDEMIA  . EDEMA, HANDS  . Fall from bed  . Anemia  . Nocturnal leg cramps  . Vitamin B12 deficiency  . Medicare annual wellness visit, initial   Past  Medical History  Diagnosis Date  . Diabetes mellitus type 1     (prior saw Dr. Lodema Hong endo, would like to establish locally)  . Generalized headaches     frequent  . Hypothyroidism   . Convulsions/seizures     unknown type, nml EEG  . History of chicken pox    Past Surgical History  Procedure Date  . Cesarean section 1983   History  Substance Use Topics  . Smoking status: Never Smoker   . Smokeless tobacco: Never Used  . Alcohol Use: Yes     Comment: Socially (on weekends)   Family History  Problem Relation Age of Onset  . Stroke Mother   . Hypertension Mother   . Hypertension Father   . Coronary artery disease Father 27    MI  . Hypertension Sister   . Diabetes Cousin     type 1  . Cancer Maternal Aunt     brain  . Hyperlipidemia Brother    No Known Allergies Current Outpatient Prescriptions on File Prior to Visit  Medication Sig Dispense Refill  . insulin aspart (NOVOLOG PENFILL) 100 UNIT/ML injection Inject 5 Units into the skin 3 (three) times daily before meals. 5 units before meals, 2 before snaks, and sliding scale as directed (150-200 1, 201-250 3, 251-300 5, 301-350 7, >351 10)      . insulin glargine (LANTUS SOLOSTAR) 100 UNIT/ML injection Inject 10 Units into the skin at bedtime.       Marland Kitchen  levothyroxine (SYNTHROID, LEVOTHROID) 100 MCG tablet Take 1 tablet (100 mcg total) by mouth daily.  30 tablet  6  . cyanocobalamin (,VITAMIN B-12,) 1000 MCG/ML injection Inject 1 mL (1,000 mcg total) into the muscle once. Start 03/2012  1 mL  0      Review of Systems  Constitutional: Negative for fever, chills, activity change, appetite change, fatigue and unexpected weight change.  HENT: Negative for hearing loss and neck pain.   Eyes: Negative for visual disturbance.  Respiratory: Negative for cough, chest tightness, shortness of breath and wheezing.   Cardiovascular: Negative for chest pain, palpitations and leg swelling.  Gastrointestinal: Negative for nausea,  vomiting, abdominal pain, diarrhea, constipation, blood in stool and abdominal distention.  Genitourinary: Negative for hematuria and difficulty urinating.  Musculoskeletal: Negative for myalgias and arthralgias.  Skin: Negative for rash.  Neurological: Negative for dizziness, seizures, syncope and headaches.  Hematological: Does not bruise/bleed easily.  Psychiatric/Behavioral: Negative for dysphoric mood. The patient is not nervous/anxious.        Objective:   Physical Exam  Nursing note and vitals reviewed. Constitutional: She is oriented to person, place, and time. She appears well-developed and well-nourished. No distress.  HENT:  Head: Normocephalic and atraumatic.  Right Ear: Hearing, tympanic membrane, external ear and ear canal normal.  Left Ear: Hearing, tympanic membrane, external ear and ear canal normal.  Nose: Nose normal.  Mouth/Throat: Oropharynx is clear and moist. No oropharyngeal exudate.  Eyes: Conjunctivae normal and EOM are normal. Pupils are equal, round, and reactive to light. No scleral icterus.  Neck: Normal range of motion. Neck supple. No thyromegaly present.  Cardiovascular: Normal rate, regular rhythm, normal heart sounds and intact distal pulses.   No murmur heard. Pulses:      Radial pulses are 2+ on the right side, and 2+ on the left side.  Pulmonary/Chest: Effort normal and breath sounds normal. No respiratory distress. She has no wheezes. She has no rales. Right breast exhibits no inverted nipple, no mass, no nipple discharge, no skin change and no tenderness. Left breast exhibits mass (firm fixed mass at 3oclock). Left breast exhibits no inverted nipple, no nipple discharge, no skin change and no tenderness. Breasts are symmetrical.  Abdominal: Soft. Bowel sounds are normal. She exhibits no distension and no mass. There is no tenderness. There is no rebound and no guarding.  Musculoskeletal: Normal range of motion.  Lymphadenopathy:    She has no  cervical adenopathy.    She has no axillary adenopathy.       Right axillary: No lateral adenopathy present.       Left axillary: No lateral adenopathy present.      Right: No supraclavicular adenopathy present.       Left: No supraclavicular adenopathy present.  Neurological: She is alert and oriented to person, place, and time.       CN grossly intact, station and gait intact  Skin: Skin is warm and dry. No rash noted.  Psychiatric: She has a normal mood and affect. Her behavior is normal. Judgment and thought content normal.       Assessment & Plan:

## 2012-06-19 NOTE — Assessment & Plan Note (Signed)
Low b12, iron levels.  Possible combination iron and b12 deficiency normocytic anemia.

## 2012-06-19 NOTE — Assessment & Plan Note (Signed)
Chronic. Reviewed #s with patient - elevated LDL for h/o diabetes - recommended lifestyle changes for next 6 mo, recheck FLP in 6 months, consider starting statin.

## 2012-06-19 NOTE — Patient Instructions (Addendum)
Let's start levothyroxine daily - I've sent in new dose.  Return in 4-6 weeks to recheck thyroid level. Call your insurance about the shingles shot to see if it is covered or how much it would cost and where is cheaper (here or pharmacy).  If you want to receive here, call for nurse visit. Pass by lab to pick up stool kit Talk to [facility] about setting up mammogram. Schedule vision screen. Return at your convenience for pelvic exam. Good to see you today, call us with questions. Return in 6 months for follow up or as needed.

## 2012-06-19 NOTE — Assessment & Plan Note (Signed)
Remaining overcorrected. Decrease levothyroxine to daily, recheck in 4-6 weeks.

## 2012-06-19 NOTE — Assessment & Plan Note (Signed)
New. Check diagnostic mammogram and ultrasound. Will pass by marion's office to schedule this.

## 2012-06-19 NOTE — Assessment & Plan Note (Signed)
I have personally reviewed the Medicare Annual Wellness questionnaire and have noted 1. The patient's medical and social history 2. Their use of alcohol, tobacco or illicit drugs 3. Their current medications and supplements 4. The patient's functional ability including ADL's, fall risks, home safety risks and hearing or visual impairment. 5. Diet and physical activity 6. Evidence for depression or mood disorders The patients weight, height, BMI have been recorded in the chart.  Hearing and vision has been addressed. I have made referrals, counseling and provided education to the patient based review of the above and I have provided the pt with a written personalized care plan for preventive services. See scanned questionairre. Advanced directives discussed: will main advanced directive packet to her house  Reviewed preventative protocols and updated unless pt declined. Will return for pap smear, advised to update [place] if yeast infx not resolved with OTC monistat. iFOB sent home today.

## 2012-06-19 NOTE — Assessment & Plan Note (Signed)
Followed by endo, appreciate their care.

## 2012-06-19 NOTE — Assessment & Plan Note (Signed)
Start supplementation.  Recent level 214, h/o anemia

## 2012-06-28 ENCOUNTER — Encounter: Payer: Self-pay | Admitting: *Deleted

## 2012-06-28 ENCOUNTER — Ambulatory Visit
Admission: RE | Admit: 2012-06-28 | Discharge: 2012-06-28 | Disposition: A | Discharge status: Home / Self Care | Payer: Medicare Other | Source: Ambulatory Visit | Attending: Family Medicine | Admitting: Family Medicine

## 2012-06-28 DIAGNOSIS — N6320 Unspecified lump in the left breast, unspecified quadrant: Secondary | ICD-10-CM

## 2012-07-20 ENCOUNTER — Ambulatory Visit (INDEPENDENT_AMBULATORY_CARE_PROVIDER_SITE_OTHER): Payer: Medicare Other | Admitting: *Deleted

## 2012-07-20 DIAGNOSIS — E538 Deficiency of other specified B group vitamins: Secondary | ICD-10-CM

## 2012-07-20 MED ORDER — CYANOCOBALAMIN 1000 MCG/ML IJ SOLN
1000.0000 ug | Freq: Once | INTRAMUSCULAR | Status: AC
  Administered 2012-07-20: 1000 ug via INTRAMUSCULAR

## 2012-07-21 ENCOUNTER — Ambulatory Visit: Payer: Medicare Other

## 2012-08-17 ENCOUNTER — Ambulatory Visit (INDEPENDENT_AMBULATORY_CARE_PROVIDER_SITE_OTHER): Payer: Medicare Other | Admitting: *Deleted

## 2012-08-17 ENCOUNTER — Telehealth: Payer: Self-pay | Admitting: *Deleted

## 2012-08-17 DIAGNOSIS — E538 Deficiency of other specified B group vitamins: Secondary | ICD-10-CM

## 2012-08-17 MED ORDER — CYANOCOBALAMIN 1000 MCG/ML IJ SOLN
1000.0000 ug | Freq: Once | INTRAMUSCULAR | Status: AC
  Administered 2012-08-17: 1000 ug via INTRAMUSCULAR

## 2012-08-17 MED ORDER — FLUCONAZOLE 150 MG PO TABS: 150.0000 mg | ORAL_TABLET | Freq: Once | ORAL | Status: DC

## 2012-08-17 NOTE — Telephone Encounter (Signed)
Pt was here today for a B12 and wanted me to ask you to send in a med for a yeast inf. Pt just finished an antibiotic and it caused her to have a yeast infection

## 2012-08-17 NOTE — Telephone Encounter (Signed)
plz notify diflucan sent in to [facility].

## 2012-08-18 NOTE — Telephone Encounter (Signed)
Pt notified Rx sent to pharmacy

## 2012-09-11 ENCOUNTER — Telehealth: Payer: Self-pay

## 2012-09-11 NOTE — Telephone Encounter (Signed)
plz notify [facility] ok to change manufacturers, but then pt will need to come in 4-6 wks after starting new pill for thyroid check.  Placed order in chart.

## 2012-09-11 NOTE — Telephone Encounter (Signed)
Pharmacy and patient notified. She will call back to schedule lab appt when she knows her schedule a little better.

## 2012-09-11 NOTE — Telephone Encounter (Signed)
Pt left v/m pt has refills on levothyroxine but pt request a call to [facility] [address] to OK change of manufacturers.Please advise.

## 2012-09-21 ENCOUNTER — Ambulatory Visit: Payer: Medicare Other

## 2012-11-20 ENCOUNTER — Telehealth: Payer: Self-pay | Admitting: Family Medicine

## 2012-11-20 MED ORDER — HYDROXYZINE HCL 25 MG PO TABS: 25.0000 mg | ORAL_TABLET | Freq: Three times a day (TID) | ORAL | Status: DC | PRN

## 2012-11-20 MED ORDER — CLOBETASOL PROPIONATE 0.05 % EX CREA: TOPICAL_CREAM | Freq: Two times a day (BID) | CUTANEOUS | Status: DC

## 2012-11-20 NOTE — Telephone Encounter (Signed)
Left detailed message on voicemail.  

## 2012-11-20 NOTE — Telephone Encounter (Signed)
Patient Information:  Caller Name: Junia  Phone: [phone]  Patient: [name], [name]  Gender: Female  DOB: [birth year]  Age: 67 Years  PCP: [facility] [employer])  Office Follow Up:  Does the office need to follow up with this patient?: Yes  Instructions For The Office: Office please review regarding pt is requesting Rx to be called in for poison oak due to pt unable to afford 2 office visits this month. Please call pt back regarding Rx for poison oak  RN Note:  Pt states that she can not come to the office due to she is scheduled to see MD this month and she doesn't have the money for 2 visits; Pt is requesting Rx to be called in without appt  Symptoms  Reason For Call & Symptoms: Pt is calling and states that she has poison oak everywhere;  located on legs, arms and trunk area; sx started 11/17/12; was pulling weeds at her daughters; started using Calamine lotion on 11/18/12 and it is not helping; rash is red and itchy and oozing clear fluid; not able to sleep because of the itching  Reviewed Health History In EMR: Yes  Reviewed Medications In EMR: Yes  Reviewed Allergies In EMR: Yes  Reviewed Surgeries / Procedures: Yes  Date of Onset of Symptoms: 11/17/2012  Treatments Tried: Calamine lotion  Treatments Tried Worked: No  Guideline(s) Used:  Poison Ivy - Oak or [facility]  Disposition Per Guideline:   See Today in Office  Reason For Disposition Reached:   Severe itching interferes with normal activities (e.g., work or school) or prevents sleep  Advice Given:  Hydrocortisone Cream for Itching:   Apply 1% hydrocortisone cream 4 times a day to reduce itching. Use it for 5 days.  Keep the cream in the refrigerator (Reason: it feels better if applied cold).  Apply Cold to the Area:  Soak the involved area in cool water for 20 minutes or massage it with an ice cube as often as necessary to reduce itching and oozing.  Avoid Scratching:   Cut your fingernails short  and try not to scratch so as to prevent a secondary infection from bacteria.  Expected Course:  Usually lasts 2 weeks. Treatment reduces the severity of the symptoms, not how long they last.  Call Back If:  Rash lasts longer than 3 weeks  It looks infected  You become worse.  Patient Refused Recommendation:  Patient Requests Prescription  Pt requesting Rx to be called in for poison oak; she states that she is scheduled to see MD this month and can not afford 2 office visits

## 2012-11-20 NOTE — Telephone Encounter (Signed)
plz notify - I've sent in meds for her - hydroxyzine and strong topical steroid.  Want to avoid oral steroids 2/2 diabetes.  Caution hydroxyzine can make her sleepy. to update [place] if not improved with this.  Lab Results  Component Value Date   HGBA1C 11.5* 06/13/2012

## 2012-11-20 NOTE — Telephone Encounter (Signed)
Can we call tomorrow to ensure she received message?

## 2012-11-21 ENCOUNTER — Telehealth: Payer: Self-pay | Admitting: *Deleted

## 2012-11-21 DIAGNOSIS — L237 Allergic contact dermatitis due to plants, except food: Secondary | ICD-10-CM

## 2012-11-21 NOTE — Telephone Encounter (Signed)
Spoke with pt.  She did receive message and is going to pick up medication today.  She will call office if not improving.

## 2012-11-21 NOTE — Telephone Encounter (Signed)
PA form for Atarax in your IN box for completion.

## 2012-11-21 NOTE — Telephone Encounter (Signed)
Filled out and placed in Kim's box. 

## 2012-11-22 NOTE — Telephone Encounter (Signed)
Paperwork faxed °

## 2012-11-27 NOTE — Telephone Encounter (Signed)
Approval letter received and spoke with Crystal at [facility] [address] and Hydroxyzine went thru. Letter in Dr Timoteo Expose in box for signature and scanning.

## 2012-11-29 ENCOUNTER — Emergency Department: Payer: Self-pay | Admitting: Emergency Medicine

## 2012-12-07 ENCOUNTER — Other Ambulatory Visit: Payer: Self-pay

## 2012-12-18 ENCOUNTER — Ambulatory Visit (INDEPENDENT_AMBULATORY_CARE_PROVIDER_SITE_OTHER): Payer: Medicare Other | Admitting: Family Medicine

## 2012-12-18 ENCOUNTER — Encounter: Payer: Self-pay | Admitting: Family Medicine

## 2012-12-18 VITALS — BP 128/78 | HR 72 | Temp 98.1°F | Wt 135.2 lb

## 2012-12-18 DIAGNOSIS — E039 Hypothyroidism, unspecified: Secondary | ICD-10-CM

## 2012-12-18 DIAGNOSIS — E109 Type 1 diabetes mellitus without complications: Secondary | ICD-10-CM

## 2012-12-18 DIAGNOSIS — E538 Deficiency of other specified B group vitamins: Secondary | ICD-10-CM

## 2012-12-18 LAB — TSH: TSH: 13.82 u[IU]/mL — ABNORMAL HIGH (ref 0.35–5.50)

## 2012-12-18 MED ORDER — CYANOCOBALAMIN 1000 MCG/ML IJ SOLN
1000.0000 ug | Freq: Once | INTRAMUSCULAR | Status: AC
  Administered 2012-12-18: 1000 ug via INTRAMUSCULAR

## 2012-12-18 NOTE — Addendum Note (Signed)
Addended by: Josph Macho A on: 12/18/2012 10:05 AM   Modules accepted: Orders

## 2012-12-18 NOTE — Progress Notes (Signed)
  Subjective:    Patient ID: [name], female    DOB: Oct 14, 1945, 67 y.o.   MRN: [id number]  HPI CC: 6 mo f/u  [name] is a pleasant 67 yo Type 1 diabetic followed by Dr. Tedd Sias with h/o hypothyroidism, HLD and vitamin B12 deficiency.  Poison oak improved with treatment prescribed (atarax and steroid cream.  Fall 2 wks ago - at pool with grandchildren.  Fractured L wrist and hand.  In cast.  Seeing Dr. Kirtland Bouchard ortho at [facility].  T1DM - followed by Dr. Tedd Sias - to start insulin pump.  Had test done - continuous glucose monitoring recently.  Seeing monthly, last saw last week.  Hypothyroidism - due for TSH check.  Last visit we decided to decrease levothyroxine to daily, but in interim pt never filled new lower dose, and manufacturer changed.  Noticing increased weight and increased appetite.  Vit B12 def - has not had B12 shot since 07/2012.   Lab Results  Component Value Date   VITAMINB12 214 06/13/2012   Wt Readings from Last 3 Encounters:  12/18/12 135 lb 4 oz (61.349 kg)  06/19/12 122 lb 8 oz (55.566 kg)  04/18/12 128 lb 12 oz (58.401 kg)    Past Medical History  Diagnosis Date  . Diabetes mellitus type 1     (prior saw Dr. Lodema Hong endo, would like to establish locally)  . Generalized headaches     frequent  . Hypothyroidism   . Convulsions/seizures     unknown type, nml EEG  . History of chicken pox   . Anemia      Review of Systems Per HPI    Objective:   Physical Exam  Nursing note and vitals reviewed. Constitutional: She appears well-developed and well-nourished. No distress.  HENT:  Head: Normocephalic and atraumatic.  Mouth/Throat: Oropharynx is clear and moist. No oropharyngeal exudate.  Eyes: Conjunctivae and EOM are normal. Pupils are equal, round, and reactive to light. No scleral icterus.  Neck: Normal range of motion. Neck supple. No thyromegaly present.  Cardiovascular: Normal rate, regular rhythm, normal heart sounds and intact distal pulses.    No murmur heard. Pulmonary/Chest: Effort normal and breath sounds normal. No respiratory distress. She has no wheezes. She has no rales.  Musculoskeletal: She exhibits no edema.  cast on L wrist  Skin: Skin is warm and dry. No rash noted.       Assessment & Plan:

## 2012-12-18 NOTE — Patient Instructions (Addendum)
Blood work today to check thyroid and B12, then we will give you B12 shot and restart monthly. We will call you with results of blood work and change in Thyroid med as indicated. Good to see you today, call us with questions. Return in 6 months for medicare wellness visit, sooner if needed.

## 2012-12-18 NOTE — Assessment & Plan Note (Signed)
Never lowered levothyroxine dose. Will check TSH today - and will lower dose as indicated. Pt agrees with plan.

## 2012-12-18 NOTE — Assessment & Plan Note (Signed)
Check intrinsic factor today. b12 shot today.

## 2012-12-18 NOTE — Assessment & Plan Note (Signed)
Working on improving diabetic control. Appreciate Dr. Tedd Sias care of patient.

## 2012-12-19 ENCOUNTER — Other Ambulatory Visit: Payer: Self-pay | Admitting: Family Medicine

## 2012-12-19 MED ORDER — LEVOTHYROXINE SODIUM 137 MCG PO TABS: 137.0000 ug | ORAL_TABLET | Freq: Every day | ORAL | Status: DC

## 2012-12-21 ENCOUNTER — Encounter: Payer: Self-pay | Admitting: Family Medicine

## 2013-01-03 ENCOUNTER — Other Ambulatory Visit: Payer: Self-pay

## 2013-01-18 ENCOUNTER — Ambulatory Visit (INDEPENDENT_AMBULATORY_CARE_PROVIDER_SITE_OTHER): Payer: Medicare Other | Admitting: *Deleted

## 2013-01-18 DIAGNOSIS — E538 Deficiency of other specified B group vitamins: Secondary | ICD-10-CM

## 2013-01-18 MED ORDER — CYANOCOBALAMIN 1000 MCG/ML IJ SOLN
1000.0000 ug | Freq: Once | INTRAMUSCULAR | Status: AC
  Administered 2013-01-18: 1000 ug via INTRAMUSCULAR

## 2013-02-20 ENCOUNTER — Ambulatory Visit (INDEPENDENT_AMBULATORY_CARE_PROVIDER_SITE_OTHER): Payer: Medicare Other | Admitting: *Deleted

## 2013-02-20 DIAGNOSIS — Z23 Encounter for immunization: Secondary | ICD-10-CM

## 2013-02-20 DIAGNOSIS — E538 Deficiency of other specified B group vitamins: Secondary | ICD-10-CM

## 2013-02-20 MED ORDER — CYANOCOBALAMIN 1000 MCG/ML IJ SOLN
1000.0000 ug | Freq: Once | INTRAMUSCULAR | Status: AC
  Administered 2013-02-20: 1000 ug via INTRAMUSCULAR

## 2013-03-23 ENCOUNTER — Ambulatory Visit: Payer: Medicare Other

## 2013-04-24 ENCOUNTER — Ambulatory Visit (INDEPENDENT_AMBULATORY_CARE_PROVIDER_SITE_OTHER): Payer: Medicare Other

## 2013-04-24 DIAGNOSIS — E538 Deficiency of other specified B group vitamins: Secondary | ICD-10-CM

## 2013-04-24 MED ORDER — CYANOCOBALAMIN 1000 MCG/ML IJ SOLN
1000.0000 ug | Freq: Once | INTRAMUSCULAR | Status: AC
  Administered 2013-04-24: 1000 ug via INTRAMUSCULAR

## 2013-05-15 ENCOUNTER — Inpatient Hospital Stay: Payer: Self-pay | Admitting: Internal Medicine

## 2013-05-15 LAB — BASIC METABOLIC PANEL
Anion Gap: 4 — ABNORMAL LOW (ref 7–16)
BUN: 15 mg/dL (ref 7–18)
Chloride: 108 mmol/L — ABNORMAL HIGH (ref 98–107)
Creatinine: 0.99 mg/dL (ref 0.60–1.30)
EGFR (African American): >60
Glucose: 75 mg/dL (ref 65–99)
Osmolality: 273 (ref 275–301)
Sodium: 137 mmol/L (ref 136–145)

## 2013-05-15 LAB — URINALYSIS, COMPLETE
Bacteria: NONE SEEN
Leukocyte Esterase: NEGATIVE
Nitrite: NEGATIVE
Ph: 5 (ref 4.5–8.0)
WBC UR: 1 /HPF (ref 0–5)

## 2013-05-15 LAB — T4, FREE: Free Thyroxine: 1.33 ng/dL (ref 0.76–1.46)

## 2013-05-15 LAB — CBC WITH DIFFERENTIAL/PLATELET
Basophil #: 0 10*3/uL (ref 0.0–0.1)
Basophil %: 0.5 %
Eosinophil %: 4.5 %
HCT: 34.5 % — ABNORMAL LOW (ref 35.0–47.0)
HGB: 11.6 g/dL — ABNORMAL LOW (ref 12.0–16.0)
Lymphocyte #: 3.7 10*3/uL — ABNORMAL HIGH (ref 1.0–3.6)
Lymphocyte %: 53.5 %
MCHC: 33.7 g/dL (ref 32.0–36.0)
Monocyte #: 0.5 x10 3/mm (ref 0.2–0.9)
Monocyte %: 7.6 %
Neutrophil #: 2.3 10*3/uL (ref 1.4–6.5)
Neutrophil %: 33.9 %
Platelet: 202 10*3/uL (ref 150–440)
RBC: 4.17 10*6/uL (ref 3.80–5.20)
RDW: 13.5 % (ref 11.5–14.5)
WBC: 6.8 10*3/uL (ref 3.6–11.0)

## 2013-05-15 LAB — TSH: Thyroid Stimulating Horm: 0.075 u[IU]/mL — ABNORMAL LOW

## 2013-05-16 LAB — HEMOGLOBIN A1C: Hemoglobin A1C: 8.5 % — ABNORMAL HIGH (ref 4.2–6.3)

## 2013-05-16 LAB — LIPID PANEL
Cholesterol: 139 mg/dL (ref 0–200)
HDL Cholesterol: 34 mg/dL — ABNORMAL LOW (ref 40–60)
Ldl Cholesterol, Calc: 84 mg/dL (ref 0–100)
Triglycerides: 104 mg/dL (ref 0–200)

## 2013-05-16 LAB — BASIC METABOLIC PANEL
Calcium, Total: 9.2 mg/dL (ref 8.5–10.1)
Chloride: 105 mmol/L (ref 98–107)
Creatinine: 1 mg/dL (ref 0.60–1.30)
EGFR (African American): >60
EGFR (Non-African Amer.): 59 — ABNORMAL LOW
Glucose: 256 mg/dL — ABNORMAL HIGH (ref 65–99)
Osmolality: 277 (ref 275–301)
Sodium: 134 mmol/L — ABNORMAL LOW (ref 136–145)

## 2013-05-23 ENCOUNTER — Other Ambulatory Visit: Payer: Self-pay | Admitting: Family Medicine

## 2013-05-29 ENCOUNTER — Ambulatory Visit: Payer: Medicare Other

## 2013-06-12 ENCOUNTER — Other Ambulatory Visit: Payer: Self-pay | Admitting: Family Medicine

## 2013-06-12 DIAGNOSIS — E109 Type 1 diabetes mellitus without complications: Secondary | ICD-10-CM

## 2013-06-12 DIAGNOSIS — E538 Deficiency of other specified B group vitamins: Secondary | ICD-10-CM

## 2013-06-12 DIAGNOSIS — D649 Anemia, unspecified: Secondary | ICD-10-CM

## 2013-06-12 DIAGNOSIS — E782 Mixed hyperlipidemia: Secondary | ICD-10-CM

## 2013-06-12 DIAGNOSIS — E039 Hypothyroidism, unspecified: Secondary | ICD-10-CM

## 2013-06-15 ENCOUNTER — Other Ambulatory Visit: Payer: Medicare Other

## 2013-06-19 ENCOUNTER — Other Ambulatory Visit (INDEPENDENT_AMBULATORY_CARE_PROVIDER_SITE_OTHER): Payer: Medicare Other

## 2013-06-19 DIAGNOSIS — E039 Hypothyroidism, unspecified: Secondary | ICD-10-CM

## 2013-06-19 DIAGNOSIS — E538 Deficiency of other specified B group vitamins: Secondary | ICD-10-CM

## 2013-06-19 DIAGNOSIS — E782 Mixed hyperlipidemia: Secondary | ICD-10-CM

## 2013-06-19 DIAGNOSIS — E109 Type 1 diabetes mellitus without complications: Secondary | ICD-10-CM

## 2013-06-19 DIAGNOSIS — D649 Anemia, unspecified: Secondary | ICD-10-CM

## 2013-06-19 LAB — LDL CHOLESTEROL, DIRECT: Direct LDL: 155.3 mg/dL

## 2013-06-19 LAB — TSH: TSH: 0.53 u[IU]/mL (ref 0.35–5.50)

## 2013-06-19 LAB — CBC WITH DIFFERENTIAL/PLATELET
BASOS ABS: 0 10*3/uL (ref 0.0–0.1)
Basophils Relative: 0.8 % (ref 0.0–3.0)
Eosinophils Absolute: 0.2 10*3/uL (ref 0.0–0.7)
Eosinophils Relative: 4.2 % (ref 0.0–5.0)
HCT: 38.9 % (ref 36.0–46.0)
Hemoglobin: 12.7 g/dL (ref 12.0–15.0)
LYMPHS PCT: 50.3 % — AB (ref 12.0–46.0)
Lymphs Abs: 2.6 10*3/uL (ref 0.7–4.0)
MCHC: 32.7 g/dL (ref 30.0–36.0)
MCV: 82.2 fl (ref 78.0–100.0)
MONO ABS: 0.4 10*3/uL (ref 0.1–1.0)
Monocytes Relative: 8.2 % (ref 3.0–12.0)
NEUTROS PCT: 36.5 % — AB (ref 43.0–77.0)
Neutro Abs: 1.9 10*3/uL (ref 1.4–7.7)
PLATELETS: 193 10*3/uL (ref 150.0–400.0)
RBC: 4.73 Mil/uL (ref 3.87–5.11)
RDW: 13.5 % (ref 11.5–14.6)
WBC: 5.2 10*3/uL (ref 4.5–10.5)

## 2013-06-19 LAB — LIPID PANEL
CHOL/HDL RATIO: 5
Cholesterol: 218 mg/dL — ABNORMAL HIGH (ref 0–200)
HDL: 43.6 mg/dL (ref >39.00)
TRIGLYCERIDES: 127 mg/dL (ref 0.0–149.0)
VLDL: 25.4 mg/dL (ref 0.0–40.0)

## 2013-06-19 LAB — IBC PANEL
IRON: 46 ug/dL (ref 42–145)
Saturation Ratios: 11.6 % — ABNORMAL LOW (ref 20.0–50.0)
Transferrin: 284.3 mg/dL (ref 212.0–360.0)

## 2013-06-19 LAB — BASIC METABOLIC PANEL
BUN: 18 mg/dL (ref 6–23)
CALCIUM: 9.3 mg/dL (ref 8.4–10.5)
CO2: 25 meq/L (ref 19–32)
Chloride: 100 mEq/L (ref 96–112)
Creatinine, Ser: 1 mg/dL (ref 0.4–1.2)
GFR: 60.28 mL/min (ref >60.00)
GLUCOSE: 166 mg/dL — AB (ref 70–99)
Potassium: 5.1 mEq/L (ref 3.5–5.1)
Sodium: 131 mEq/L — ABNORMAL LOW (ref 135–145)

## 2013-06-19 LAB — MICROALBUMIN / CREATININE URINE RATIO
CREATININE, U: 81.7 mg/dL
Microalb Creat Ratio: 0.1 mg/g (ref 0.0–30.0)
Microalb, Ur: 0.1 mg/dL (ref 0.0–1.9)

## 2013-06-19 LAB — VITAMIN B12: Vitamin B-12: 330 pg/mL (ref 211–911)

## 2013-06-19 LAB — FERRITIN: FERRITIN: 26.7 ng/mL (ref 10.0–291.0)

## 2013-06-19 LAB — HEMOGLOBIN A1C: HEMOGLOBIN A1C: 9 % — AB (ref 4.6–6.5)

## 2013-06-21 ENCOUNTER — Other Ambulatory Visit: Payer: Medicare Other

## 2013-06-22 ENCOUNTER — Encounter: Payer: Medicare Other | Admitting: Family Medicine

## 2013-06-29 ENCOUNTER — Encounter: Payer: Medicare Other | Admitting: Family Medicine

## 2013-07-10 ENCOUNTER — Encounter: Payer: Self-pay | Admitting: Family Medicine

## 2013-07-10 ENCOUNTER — Ambulatory Visit (INDEPENDENT_AMBULATORY_CARE_PROVIDER_SITE_OTHER): Payer: Medicare Other | Admitting: Family Medicine

## 2013-07-10 VITALS — BP 122/78 | HR 88 | Temp 97.6°F | Wt 132.2 lb

## 2013-07-10 DIAGNOSIS — E109 Type 1 diabetes mellitus without complications: Secondary | ICD-10-CM

## 2013-07-10 DIAGNOSIS — B9789 Other viral agents as the cause of diseases classified elsewhere: Principal | ICD-10-CM

## 2013-07-10 DIAGNOSIS — J069 Acute upper respiratory infection, unspecified: Secondary | ICD-10-CM

## 2013-07-10 NOTE — Assessment & Plan Note (Signed)
Anticipate viral given short duration and sxs. Discussed red flags to return, specifically reasons to call us for abx course. See pt instructions for supportive care. Pt will return for physical as due - recent blood work done for this.

## 2013-07-10 NOTE — Progress Notes (Signed)
Pre-visit discussion using our clinic review tool. No additional management support is needed unless otherwise documented below in the visit note.  

## 2013-07-10 NOTE — Patient Instructions (Signed)
Sounds like you have a viral upper respiratory infection. Antibiotics are not needed for this.  Viral infections usually take 7-10 days to resolve.  The cough can last a few weeks to go away. Take immediate release guaifenesin with plenty of water to help mobilize mucous. Push fluids and plenty of rest. Please return if you are not improving as expected, or if you have high fevers (>101.5) or difficulty swallowing or worsening productive cough. Call clinic with questions.  Good to see you today.

## 2013-07-10 NOTE — Assessment & Plan Note (Signed)
Followed by Dr. Tedd SiasSolum.  Appreciate her assistance. Pt now on insulin pump (apidra)

## 2013-07-10 NOTE — Progress Notes (Signed)
BP 122/78  Pulse 88  Temp(Src) 97.6 F (36.4 C) (Oral)  Wt 132 lb 4 oz (59.988 kg)   CC: URI?  Subjective:    Patient ID: [name], female    DOB: 1946/03/30, 68 y.o.   MRN: [id number]  HPI: [name] is a 68 y.o. female presenting on 07/10/2013 with URI   2d ago started with rhinorrhea.  Yesterday had significant congestion in head and sore throat and sweats and felt nauseated.  Today feeling better.  Mild intermittent cough ongoing for last 2 weeks.  + HA and PNDrainage.  Sleeping ok at night time.  No fevers, abd pain, ear or tooth pain, new rashes. No sick contacts at home. Did receive flu shot this year No h/o asthma, no h/o smoking.  Has tried tylenol cold OTC which has provided some relief.  T1DM - doing ok with this.  Per pt last A1c 8%.  Now using pump (apidra) - followed by Dr. Tedd SiasSolum Lab Results  Component Value Date   HGBA1C 9.0* 06/19/2013    Relevant past medical, surgical, family and social history reviewed and updated. Allergies and medications reviewed and updated. Current Outpatient Prescriptions on File Prior to Visit  Medication Sig  . levothyroxine (SYNTHROID, LEVOTHROID) 137 MCG tablet TAKE ONE TABLET BY MOUTH ONCE DAILY BEFORE  BREAKFAST   No current facility-administered medications on file prior to visit.    Review of Systems Per HPI unless specifically indicated above    Objective:    BP 122/78  Pulse 88  Temp(Src) 97.6 F (36.4 C) (Oral)  Wt 132 lb 4 oz (59.988 kg)  Physical Exam  Nursing note and vitals reviewed. Constitutional: She appears well-developed and well-nourished. No distress.  HENT:  Head: Normocephalic and atraumatic.  Right Ear: Hearing, tympanic membrane, external ear and ear canal normal.  Left Ear: Hearing, tympanic membrane, external ear and ear canal normal.  Nose: Mucosal edema and rhinorrhea present. Right sinus exhibits no maxillary sinus tenderness and no frontal sinus tenderness. Left sinus  exhibits no maxillary sinus tenderness and no frontal sinus tenderness.  Mouth/Throat: Uvula is midline, oropharynx is clear and moist and mucous membranes are normal. No oropharyngeal exudate, posterior oropharyngeal edema, posterior oropharyngeal erythema or tonsillar abscesses.  Nasal mucosal irritation bilaterally  Eyes: Conjunctivae and EOM are normal. Pupils are equal, round, and reactive to light. No scleral icterus.  Neck: Normal range of motion. Neck supple.  Cardiovascular: Normal rate, regular rhythm, normal heart sounds and intact distal pulses.   No murmur heard. Pulmonary/Chest: Effort normal and breath sounds normal. No respiratory distress. She has no wheezes. She has no rales.  Lymphadenopathy:    She has no cervical adenopathy.  Skin: Skin is warm and dry. No rash noted.      Assessment & Plan:   Problem List Items Addressed This Visit   DIABETES MELLITUS, TYPE I     Followed by Dr. Tedd SiasSolum.  Appreciate her assistance. Pt now on insulin pump (apidra)    Relevant Medications      Insulin Glulisine (APIDRA IJ)   Viral URI with cough - Primary     Anticipate viral given short duration and sxs. Discussed red flags to return, specifically reasons to call us for abx course. See pt instructions for supportive care. Pt will return for physical as due - recent blood work done for this.        Follow up plan: Return if symptoms worsen or fail to improve.

## 2013-07-12 ENCOUNTER — Telehealth: Payer: Self-pay

## 2013-07-12 NOTE — Telephone Encounter (Signed)
Relevant patient education assigned to patient using Emmi. ° °

## 2013-07-16 ENCOUNTER — Telehealth: Payer: Self-pay | Admitting: Family Medicine

## 2013-07-16 NOTE — Telephone Encounter (Signed)
Noted. Can we call to f/u on pt status today?

## 2013-07-16 NOTE — Telephone Encounter (Signed)
[facility] Triage Call Report Triage Record Num: [id number] Operator: Maryfrances BunnellAngie Christian Patient Name: [name] Call Date & Time: 07/14/2013 7:29:30PM Patient Phone: [phone] PCP: Neta MendsWanda K. Panosh Patient Gender: Female PCP Fax : [phone] Patient DOB: June 01, 1945 [facility] Reason for Call: Caller: Devera/Patient; PCP: Eustaquio BoydenGutierrez, Javier (Family Practice); CB#: [phone]; Call regarding Dx. upper resp. infect. 07/09/13 MD advised to call if worse, now she is vomiting and also has diabetes; Seen in office 07/09/13 for uri; Temp has been around 100 per TA; Vomiting onset 07/14/13, has thrown up 6-7 x's with the last time being about 1800 07/14/13; Blood sugar last about 1930 and it was 185 after eating popsicle; Stools are a little looser today; Per Diabetes: Gastrointestinal Problems Protocol "Vomiting more than 1 time." Home care advice given. Standing orders used for Zofran 4-8 mg po Q 8 hrs # 6 per Dr. Lady SaucierPanosh oncall MD called to [facility] [phone]. Protocol(s) Used: Diabetes: Gastrointestinal Problems Recommended Outcome per Protocol: See Provider within 24 hours Reason for Outcome: Vomiting more than 1 time Care Advice: Follow the usual meal plan if possible. Drink extra non-caloric fluids. If unable to eat at all, drink regular soft drinks and juices so that 50 grams of carbohydrates are taken in every 3 to 4 hours. ~ ~ SYMPTOM / CONDITION MANAGEMENT Low Blood Sugar Treatment: - Always have some form of glucose available for use. This may include glucose tablets or hard candies. - When blood sugar is known to be low, take 4 oz. (0.1 liter) of juice, 3 glucose tablets, or 5-6 hard candies. - If unavailable, take 10 to 15 grams of any carbohydrate: -- 6-8 oz. (.15 to .2 liter) skim or 1% milk -- candy bar, fruit, cheese or crackers - Retest blood sugar after 15 to 20 minutes. - If blood sugar still less than 80 mg/dl, continue treating and testing until  blood sugar reaches 80 mg/dl. - Be careful not to overtreat. ~ Blood Sugar and Ketone Testing: - Check blood sugar as recommended, and more often whenever it has been high or low, any change to the treatment plan, increased stress, illness, infection, or a change to the regular schedule. - Test for urine ketones anytime the blood sugar is above 250 mg/dl, when there is stress, acute illness, nausea, vomiting, or abdominal pain. - Follow action plan for illness. - Record blood sugar and ketones in meter log or separate logbook, and take to all provider visits. - Drink extra water and other non-sugar fluids to prevent dehydration when the blood sugar is high and urine ketones are present. ~ Vomiting Care Advice: - Do not eat solid foods until vomiting subsides. - Begin taking fluids by sucking on ice chips or popsicles or taking sips of cool clear, nonprescription oral rehydration solution). - Gradually drink larger amounts of these fluids so that you are drinking six to eight 8 oz. (.2 liter) of fluids a day. - Keep activity to a minimum. - After vomiting subsides, eat smaller, more frequent meals of easily digested foods such as crackers, toast, bananas, rice, cooked cereal, applesauce, broth, baked or mashed potatoes, chicken or [place]key without skin. Eat slowly. - Take fluids 30 minutes before or 60 minutes after meals. ~ 07/14/2013 7:46:48PM Page 1 of 2 [email or web address] [facility] Triage Call Report Patient Name: [name] continuation page/s - Avoid high fat, highly seasoned, high fiber or high sugar content foods. - Avoid extremely hot or cold foods. - Do not take pain medication (  such as aspirin, NSAIDs) while nauseated or vomiting. - Consult your provider for advice regarding continuing prescription medication. - Rest as much as possible in a sitting or in a propped lying position. Do not lie flat for at least 2 hours after eating. Diabetes Action Plan: - Follow  recommended action plan and diet - Check blood sugar as recommended - Take diabetic pills or insulin as ordered Follow action plan for illness. ~ 07/14/2013 7:46:48PM Page 2 of 2 [email or web address]

## 2013-07-16 NOTE — Telephone Encounter (Signed)
Spoke with patient. She said [facility] is [facility] better. No more vomiting or diarrhea. She said she is able to eat and drink as normal and feels 100% better.

## 2013-07-31 ENCOUNTER — Other Ambulatory Visit: Payer: Medicare Other

## 2013-07-31 LAB — FECAL OCCULT BLOOD, IMMUNOCHEMICAL: Fecal Occult Bld: NEGATIVE

## 2013-08-20 ENCOUNTER — Encounter: Payer: Medicare Other | Admitting: Family Medicine

## 2013-12-07 ENCOUNTER — Telehealth: Payer: Self-pay | Admitting: *Deleted

## 2013-12-07 NOTE — Telephone Encounter (Signed)
**  DIABETIC BUNDLE**  Called pt because she is due for f/u and lab appt to check lipid and a1c Pt advise me that she isn't coming back to see Dr. Reece AgarG and refused to schedule any appts.

## 2014-03-15 ENCOUNTER — Other Ambulatory Visit: Payer: Self-pay

## 2014-07-05 DIAGNOSIS — E1065 Type 1 diabetes mellitus with hyperglycemia: Secondary | ICD-10-CM | POA: Diagnosis not present

## 2014-09-20 NOTE — H&P (Signed)
PATIENT NAMPricilla [name]:  [name], [name] MR#:  [id number] DATE OF BIRTH:  1945/06/30  DATE OF ADMISSION:  05/15/2013  PRIMARY ENDOCRINOLOGIST: Dr. Tedd SiasSolum.  REFERRING EMERGENCY ROOM PHYSICIAN: Dr. Sharma CovertNorman.  ADMITTING PHYSICIAN: Delesia Martinek G. Elisabeth PigeonVachhani, MD  CHIEF COMPLAINT: Hypoglycemia.   HISTORY OF PRESENT ILLNESS: This is a 69 year old female with a known case of diabetes, was on insulin injections before recently switched to insulin pump by Dr. Pricilla HandlerSolum's office; today was in the car and started having seizure and so EMS arrived and checked the sugar  level which was 15, so they gave her D50 injections twice, 2 ampules, and she came to the Emergency Room. Initially sugar level was more than 200 when we checked, but then within a few minutes sugar dropped again to 17 in the ER, so the ER physician started on D5 and has drip at 250 mL/hour, then sugar level is 200 again now. The patient became alert and oriented and she denies any suicidal attempt or any accidental overdose. She says that she just connected her insulin pump and that might be due to her setting on the pump. She does not know much about her pump.   Denies any other complaints currently.   REVIEW OF SYSTEMS: CONSTITUTIONAL: Negative for fever, fatigue, muscle weakness, pain or weight loss.  EYES: No blurring, double vision or discharge.  ENT:  No tinnitus, ear pain or hearing loss.  RESPIRATORY: No cough, wheezing, hemoptysis or shortness of breath.  CARDIOVASCULAR: No chest pain, orthopnea, edema or palpitations.  GASTROINTESTINAL: No nausea, vomiting, diarrhea or abdominal pain.  GENITOURINARY: No dysuria, hematuria or increased frequency.  ENDOCRINE: No polyhydria. No nocturia, no increased sweating.  SKIN: No acne, rashes or lesions.  MUSCULOSKELETAL: No pain or swelling in the joints.  NEUROLOGICAL: No numbness, weakness, tremors or vertigo.  PSYCHIATRIC: No anxiety, insomnia, bipolar disorder but had one seizure episode and she said  that she has seizures whenever her sugar drops down. That happened once in the past.   PAST MEDICAL HISTORY: Diabetes and hypothyroidism.   PAST SURGICAL HISTORY: Hysterectomy years ago.   SOCIAL HISTORY: She denies smoking, drinking, alcohol or illegal drug use. She lives with  her friend and her daughter and is independent in her day-to-day activities.   FAMILY HISTORY: Positive for MI in father and stroke in her mother.   HOME MEDICATIONS: Levothyroxine 137 mcg once a day and Apidra via subcutaneous insulin pump.   PHYSICAL EXAMINATION:  VITAL SIGNS: In the ER, temperature 97.5, pulse rate 83, respirations 16, blood pressure 135/65 and pulse oximetry 100% on room air.  GENERAL: The patient is clear, alert and oriented to time, place and person. Currently does not appear in any acute distress.  HEENT: Head and neck atraumatic. Conjunctivae pink. Oral mucosa moist.  NECK: Supple. No JVD.  RESPIRATORY: Bilateral clear and equal air entry.  CARDIOVASCULAR: S1, S2 present, regular. No murmur.  ABDOMEN: Soft, nontender. Bowel sounds present. No organomegaly.  SKIN: No rashes. Legs: No edema.  NEUROLOGICAL: Power 5 out of 5. Moves all four limbs. Follows commands.  MUSCULOSKELETAL: Joints: No swelling or tenderness.  PSYCHIATRIC: Does not appear in any acute psychiatric illness at this time.   LABORATORIES: Glucose level was 16, which came to 221 and then 147. BUN 15, creatinine 0.99, sodium 137, potassium level 3.1, chloride 108, CO2 25. Calcium is 9.8. TSH level 0.075. WBC 6.8, hemoglobin 11.6 and platelet count 202. MCV is 83.   ASSESSMENT AND PLAN: A 69 year old female with history of  diabetes, recently started on insulin pump, found having seizures and severe hypoglycemia. Her blood sugar level came up with dextrose injection and currently she is on dextrose drip. 1.  Hypoglycemia. This is iatrogenic secondary to the wrong settings on insulin pump. We need to reset it. Dr. Tedd Sias, her  endocrinologist, saw the patient in the Emergency Room and changed the settings on her insulin pump and lowered it down. Currently, the patient is taken off the insulin pump and she is on dextrose 5%, 250 mL/hour by ER physician. I will cut it down to 50 mL/hour as blood sugar level is more than 200 and will check her blood sugar level every hourly till it is more than 200 for 2 consecutive hours and then  to stop D5 drip and continue checking her blood sugar level every hourly till it remains more than 200 without any supplemental dextrose. When the sugar remains more than 200 for 2 hours without any dextrose, the nurse needs to allow the patient to start her insulin pump which is already reset now by Dr. Tedd Sias and then continue checking her blood sugar every 4 hourly. Her insulin was short-acting Apidra, so she should not be needing D5 drip for a long time. So we will monitor her on the floor with telemetry with hourly monitoring of her blood sugar level.  2.  Hypothyroidism. She is on replacement. Currently TSH is very low, so we need to cut down her supplemental doses. Spoke to Dr. Tedd Sias and from tomorrow, will decrease the dose of levothyroxine.  3.  Hypokalemia. Will replace it orally and recheck tomorrow.   CONDITION: Critical due to severe hypoglycemia and requiring IV dextrose drip and having had a seizure as a result of hypoglycemia.   We will check blood sugar level every hourly and adjust the management accordingly.   The plan explained to the patient and she understands and agrees.   CODE STATUS: FULL CODE.   TOTAL TIME SPENT ON THIS ADMISSION: Critical care 50 minutes.   ____________________________ [name] [name], MD [id number] D: 05/15/2013 17:09:31 ET T: 05/15/2013 18:21:45 ET JOB#: [id number]  cc: [name]. [name], MD, <Dictator> A. Wendall Mola, MD  [facility] MD ELECTRONICALLY SIGNED 05/21/2013 14:25

## 2014-09-20 NOTE — Consult Note (Signed)
PATIENT NAME:  [name], [name] MR#:  [id number] DATE OF BIRTH:  17-Jun-1945  DATE OF CONSULTATION:  05/15/2013  REFERRING PHYSICIAN:  Ella BodoAnne Norman, MD CONSULTING PHYSICIAN:  A. Wendall MolaMelissa Solum, MD  CHIEF COMPLAINT: Hypoglycemia.   HISTORY OF PRESENT ILLNESS: [name] is a 69 year old female with type 1 diabetes who was picked up by EMS after being found having a seizure. Blood sugar was found to be 17. She was treated with dextrose. She was brought to the ED. After given supportive care and monitoring of blood, sugar dropped again within an hour of her visit into the teens. She was started on continuous IV dextrose and encouraged to eat. She seems to be doing somewhat better. She is now conversive. She is able to explain that she was in her usual state of health. She has no recollection of the event that occurred while she was in a parking lot. She denies recent cold symptoms, fever, cough, dysuria.  She had history of type 1 diabetes for the last 20 or more years. Diabetes is complicated by diabetic retinopathy. Diabetes has been uncontrolled. She was started on a Medtronic MiniMed Paradigm 523 insulin pump in October. In the last week, she recalls blood sugars have been mostly in the 200 up to 600 range. I reviewed her insulin pump. The date and time are incorrect on the pump. Her basal rates are as follows: 12:00 a.m. 0.35 units an hour and 8:00 a.m. 0.45 units per hour, for a 24-hour basal rate of 10 units. Bolus settings are as follows: Insulin to carbohydrate ratio is 15, target blood sugar at 12:00 a.m. is 130 and at 6:00 a.m. is 120, insulin sensitivity is 80 and active insulin time is four hours. The date and time are such that the a.m. and p.m. are reversed on her pump; therefore, she is getting a little more aggressive settings overnight rather than in the daytime, as intended. She did check her blood sugar this morning on waking, at which time it was around 252 and she bolused 1.75 units. At 12  noon, she consumed about 30 grams carbohydrates and  before the meal had a blood sugar of 214 and bolused 2.5 units.  She does recall having had hypoglycemia overnight several times the last two weeks, into the 50s. Also about three weeks ago, she had a seizure while at home at a time when her blood sugar was low.   Second issue is hypothyroidism. She takes levothyroxine 137 mcg daily. A TSH level is currently 0.075 uIU/ml. She reports compliance with levothyroxine.   PAST MEDICAL HISTORY: 1.  Type 1 diabetes.  2.  Diabetic retinopathy.  3.  Hypothyroidism.   PAST SURGICAL HISTORY: Cesarean section.  MEDICATIONS:   1.  Apidra  insulin via insulin pump.  2.  Levothyroxine 137 mcg daily.   SOCIAL HISTORY: The patient lives with a daughter and a friend. She does not smoke cigarettes. She drinks a couple glasses of wine per week.   FAMILY HISTORY: Positive for diabetes, heart disease, stroke cancer and hypothyroidism.   ALLERGIES: No known drug allergies.   REVIEW OF SYSTEMS:  GENERAL: No weight loss. Appetite has been good.  HEENT: Denies blurred vision. Denies sore throat.  NECK: Denies neck pain, denies dysphasia.  CARDIAC: Denies chest pain. Denies palpitation.  PULMONARY: Denies cough. Denies shortness of breath.  ABDOMEN: Denies abdominal pain. Denies change in bowel habits.  EXTREMITIES: Denies leg swelling.  SKIN: Denies rash or recent skin changes.  ENDOCRINE:  Denies heat or cold intolerance.   PHYSICAL EXAMINATION: VITAL SIGNS: Height 60.9 inches, weight 133 pounds, BMI 25, temperature 97.5, pulse 83, respirations 16, oxygen saturation 100% on room air.  GENERAL: Well-developed white female in no acute distress.  HEENT: Extraocular movements are intact oropharynx is clear. Mucous membranes moist.  NECK: Supple. No thyromegaly. No neck tenderness.  CARDIAC: Regular rate and rhythm. No murmur.  PULMONARY: Clear to auscultation bilaterally. No wheeze.  ABDOMEN: Diffusely  soft, nontender, nondistended.  EXTREMITIES: No edema is present.  SKIN: No rash or dermatopathy is present.  NEUROLOGIC: No acute deficits.  PSYCHIATRIC: Alert and oriented, calm, cooperative.   LABORATORY DATA: Glucose 75, BUN 15, creatinine 0.9, potassium 3.1, anion gap 4, calcium 9.8. TSH 0.075, hematocrit 34.5%, WBC 6.8, platelets 202.   ASSESSMENT:  1. Severe hypoglycemia with seizures in an uncontrolled type 1 diabetic on an insulin pump. 2. Hypothyroidism, over-treated, now with iatrogenic hyperthyroidism   RECOMMENDATIONS: 1.  As her most recent blood sugar was increased at 221, I recommend decreasing her IV dextrose rate to just 50 mL/hour. Check blood sugars every hour and if in two hours blood sugars remain over 200, then IV dextrose will be stopped. If she can maintain fingerstick blood sugars over 200 on serial testing x 2 hour, then she can restart her insulin pump.   2. I corrected the date and time on her insulin pump. I modified her insulin pump settings to make them significantly less aggressive. New basal rate is 0.275 units at 12:00 a.m. and 0.35 units at 8:00 a.m. for a 24-hour rate of 7.8 units. I modified her insulin to carbohydrate ratio to 22, modified target blood sugar to 130 and modified insulin sensitivity to 100. The patient is aware that the plan is to restart the insulin pump sometime this evening. She will contact her daughter and have her bring in pump supplies.  3. Reduce dose of levothyroxine to 125 mcg daily.   The patient does have a follow-up with clinic in clinic with me next week. I also plan to see her tomorrow, as I understand she will be admitted.   Thank you for the kind request for consultation.   ____________________________ A. Wendall Mola, MD ams:cc D: 05/15/2013 16:58:25 ET T: 05/15/2013 18:06:37 ET JOB#: [id number]  cc: A. Wendall Mola, MD, <Dictator> Macy Mis MD ELECTRONICALLY SIGNED 05/16/2013 12:13

## 2014-09-21 NOTE — Discharge Summary (Signed)
PATIENT NAMPricilla Riffle:  [name], [name] MR#:  [id number] DATE OF BIRTH:  1946/03/02  DATE OF ADMISSION:  05/15/2013 DATE OF DISCHARGE:  05/16/2013  ENDOCRINOLOGIST: Dr. Tedd SiasSolum.    DISCHARGE DIAGNOSES:  1.  Hypoglycemia due to insulin pump use. 2.  Seizures secondary to hypoglycemia.  3.  dmii>  4.  Hypothyroidism.    DISCHARGE MEDICATIONS:  Levothyroxine 125 mcg p.o. daily, insulin pump according to Dr. Pricilla HandlerSolum's direction.   CONSULTATIONS: Endocrinology consult with Dr. Tedd SiasSolum.   HOSPITAL COURSE: A 69 year old female patient with history of diabetes, who came in because of seizures. Look at the history and physical for full details. The patient's sugar was 15, and the patient had a seizure while she was in the car. EMS went, and sugar was 15, and they gave D50 at 2 amps. The patient's sugar was 200 when she came to the ER, but did drop to 17 after a few minutes.  The patient was given D5 at 250 mL/hr and admitted to the ICU for severe hypoglycemia secondary to accidental overdose of insulin. The patient was just started on insulin pump, and the patient was monitored in the ICU. Received Accu-Cheks every one hour, and we stopped the insulin drip.  She also got D5 at 250 an hour in the ER, then continued on 50 per hour. The patient was seen by Dr. Tedd SiasSolum, and she was weaned off of D5, as her sugars were in the 200 range, and restarted the insulin drip. T. The patient was given a decreased level of insulin to carb count ratio.  The patient's insulin pump a.m. and p.m. were reversed on the pump when she came. Dr. Tedd SiasSolum has corrected  this on the pump. New basal rate is 0.275 units at 12:00 a.m., and 0.35 units at 8:00 a.m., per 24 hours, rate of 7.8 units, and .  Patient aware of those changes. She is advised to follow up with Dr. Tedd SiasSolum, also adjusted dose of levothyroxine.  Did not have any seizures, did not have any futher hypoglycemia. problems.  her thyroid meds adjusted as tsh was low and t4 was high CONDITION  AT THE TIME OF DISCHARGE: Stable. Potassium was low when she came, but that was replaced.    TIME SPENT ON DISCHARGE PREPARATION: More than 50 minutes.     ____________________________ Katha HammingSnehalatha Paulmichael Schreck, MD sk:cg D: 05/20/2013 01:13:20 ET T: 05/20/2013 05:50:59 ET JOB#: [id number]  cc: Katha HammingSnehalatha Carolyna Yerian, MD, <Dictator> Katha HammingSNEHALATHA Chizaram Latino MD ELECTRONICALLY SIGNED 06/02/2013 17:15

## 2014-09-25 DIAGNOSIS — E1065 Type 1 diabetes mellitus with hyperglycemia: Secondary | ICD-10-CM | POA: Diagnosis not present

## 2014-09-25 DIAGNOSIS — E039 Hypothyroidism, unspecified: Secondary | ICD-10-CM | POA: Diagnosis not present

## 2014-09-27 ENCOUNTER — Emergency Department
Admit: 2014-09-27 | Disposition: A | Discharge status: Home / Self Care | Payer: Self-pay | Admitting: Emergency Medicine

## 2014-09-27 DIAGNOSIS — E118 Type 2 diabetes mellitus with unspecified complications: Secondary | ICD-10-CM | POA: Diagnosis not present

## 2014-09-27 DIAGNOSIS — E119 Type 2 diabetes mellitus without complications: Secondary | ICD-10-CM | POA: Diagnosis not present

## 2014-09-27 DIAGNOSIS — E1065 Type 1 diabetes mellitus with hyperglycemia: Secondary | ICD-10-CM | POA: Diagnosis not present

## 2014-09-27 DIAGNOSIS — E875 Hyperkalemia: Secondary | ICD-10-CM | POA: Diagnosis not present

## 2014-09-27 LAB — CBC
HCT: 42 % (ref 35.0–47.0)
HGB: 14 g/dL (ref 12.0–16.0)
MCH: 28.2 pg (ref 26.0–34.0)
MCHC: 33.4 g/dL (ref 32.0–36.0)
MCV: 84 fL (ref 80–100)
Platelet: 229 10*3/uL (ref 150–440)
RBC: 4.98 10*6/uL (ref 3.80–5.20)
RDW: 12.9 % (ref 11.5–14.5)
WBC: 8.8 10*3/uL (ref 3.6–11.0)

## 2014-09-27 LAB — COMPREHENSIVE METABOLIC PANEL
ALT: 28 U/L
AST: 63 U/L — AB
Albumin: 4.5 g/dL
Alkaline Phosphatase: 109 U/L
Anion Gap: 9 (ref 7–16)
BUN: 22 mg/dL — ABNORMAL HIGH
Bilirubin,Total: 0.7 mg/dL
CO2: 22 mmol/L
Calcium, Total: 9.9 mg/dL
Chloride: 97 mmol/L — ABNORMAL LOW
Creatinine: 1.41 mg/dL — ABNORMAL HIGH
GFR CALC AF AMER: 45 — AB
GFR CALC NON AF AMER: 38 — AB
Glucose: 154 mg/dL — ABNORMAL HIGH
POTASSIUM: 5.7 mmol/L — AB
Sodium: 128 mmol/L — ABNORMAL LOW
Total Protein: 8.2 g/dL — ABNORMAL HIGH

## 2014-10-01 ENCOUNTER — Inpatient Hospital Stay
Admission: EM | Admit: 2014-10-01 | Discharge: 2014-10-03 | DRG: 638 | Disposition: A | Discharge status: Home / Self Care | Payer: Medicare Other | Attending: Internal Medicine | Admitting: Internal Medicine

## 2014-10-01 DIAGNOSIS — E875 Hyperkalemia: Secondary | ICD-10-CM | POA: Diagnosis present

## 2014-10-01 DIAGNOSIS — Z8669 Personal history of other diseases of the nervous system and sense organs: Secondary | ICD-10-CM

## 2014-10-01 DIAGNOSIS — E119 Type 2 diabetes mellitus without complications: Secondary | ICD-10-CM

## 2014-10-01 DIAGNOSIS — D649 Anemia, unspecified: Secondary | ICD-10-CM | POA: Diagnosis not present

## 2014-10-01 DIAGNOSIS — I959 Hypotension, unspecified: Secondary | ICD-10-CM | POA: Diagnosis not present

## 2014-10-01 DIAGNOSIS — Z8679 Personal history of other diseases of the circulatory system: Secondary | ICD-10-CM

## 2014-10-01 DIAGNOSIS — Z9641 Presence of insulin pump (external) (internal): Secondary | ICD-10-CM | POA: Diagnosis present

## 2014-10-01 DIAGNOSIS — E10649 Type 1 diabetes mellitus with hypoglycemia without coma: Secondary | ICD-10-CM | POA: Diagnosis not present

## 2014-10-01 DIAGNOSIS — Z794 Long term (current) use of insulin: Secondary | ICD-10-CM

## 2014-10-01 DIAGNOSIS — E039 Hypothyroidism, unspecified: Secondary | ICD-10-CM | POA: Diagnosis not present

## 2014-10-01 DIAGNOSIS — E162 Hypoglycemia, unspecified: Secondary | ICD-10-CM | POA: Diagnosis not present

## 2014-10-01 DIAGNOSIS — E871 Hypo-osmolality and hyponatremia: Secondary | ICD-10-CM | POA: Diagnosis present

## 2014-10-01 DIAGNOSIS — E1065 Type 1 diabetes mellitus with hyperglycemia: Secondary | ICD-10-CM | POA: Diagnosis not present

## 2014-10-01 LAB — URINALYSIS COMPLETE WITH MICROSCOPIC (ARMC ONLY)
BACTERIA UA: NONE SEEN
BILIRUBIN URINE: NEGATIVE
Glucose, UA: NEGATIVE mg/dL
Hgb urine dipstick: NEGATIVE
Ketones, ur: NEGATIVE mg/dL
Nitrite: NEGATIVE
Protein, ur: NEGATIVE mg/dL
SPECIFIC GRAVITY, URINE: 1.015 (ref 1.005–1.030)
SQUAMOUS EPITHELIAL / LPF: NONE SEEN
pH: 5 (ref 5.0–8.0)

## 2014-10-01 LAB — CBC WITH DIFFERENTIAL/PLATELET
BASOS ABS: 0.1 10*3/uL (ref 0–0.1)
Eosinophils Absolute: 0.3 10*3/uL (ref 0–0.7)
Eosinophils Relative: 4 %
HEMATOCRIT: 43.4 % (ref 35.0–47.0)
Hemoglobin: 14.1 g/dL (ref 12.0–16.0)
Lymphocytes Relative: 40 %
Lymphs Abs: 3.1 10*3/uL (ref 1.0–3.6)
MCH: 27.7 pg (ref 26.0–34.0)
MCHC: 32.5 g/dL (ref 32.0–36.0)
MCV: 85.1 fL (ref 80.0–100.0)
Monocytes Absolute: 0.3 10*3/uL (ref 0.2–0.9)
Monocytes Relative: 5 %
NEUTROS ABS: 3.9 10*3/uL (ref 1.4–6.5)
Platelets: 221 10*3/uL (ref 150–440)
RBC: 5.1 MIL/uL (ref 3.80–5.20)
RDW: 13.2 % (ref 11.5–14.5)
WBC: 7.7 10*3/uL (ref 3.6–11.0)

## 2014-10-01 LAB — COMPREHENSIVE METABOLIC PANEL
ALT: 43 U/L (ref 14–54)
ANION GAP: 9 (ref 5–15)
AST: 92 U/L — ABNORMAL HIGH (ref 15–41)
Albumin: 4.5 g/dL (ref 3.5–5.0)
Alkaline Phosphatase: 112 U/L (ref 38–126)
BILIRUBIN TOTAL: 0.6 mg/dL (ref 0.3–1.2)
BUN: 20 mg/dL (ref 6–20)
CALCIUM: 9.7 mg/dL (ref 8.9–10.3)
CHLORIDE: 103 mmol/L (ref 101–111)
CO2: 21 mmol/L — ABNORMAL LOW (ref 22–32)
Creatinine, Ser: 1.27 mg/dL — ABNORMAL HIGH (ref 0.44–1.00)
GFR calc non Af Amer: 43 mL/min — ABNORMAL LOW (ref >60)
GFR, EST AFRICAN AMERICAN: 49 mL/min — AB (ref >60)
GLUCOSE: 25 mg/dL — AB (ref 65–99)
Potassium: 5.1 mmol/L (ref 3.5–5.1)
Sodium: 133 mmol/L — ABNORMAL LOW (ref 135–145)
Total Protein: 8 g/dL (ref 6.5–8.1)

## 2014-10-01 LAB — GLUCOSE, CAPILLARY
GLUCOSE-CAPILLARY: 27 mg/dL — AB (ref 70–99)
GLUCOSE-CAPILLARY: 33 mg/dL — AB (ref 70–99)
GLUCOSE-CAPILLARY: 35 mg/dL — AB (ref 70–99)
GLUCOSE-CAPILLARY: 105 mg/dL — AB (ref 70–99)
GLUCOSE-CAPILLARY: 166 mg/dL — AB (ref 70–99)
Glucose-Capillary: 30 mg/dL — CL (ref 70–99)
Glucose-Capillary: 77 mg/dL (ref 70–99)

## 2014-10-01 MED ORDER — LORATADINE 10 MG PO TABS
10.0000 mg | ORAL_TABLET | Freq: Every day | ORAL | Status: DC
  Administered 2014-10-02 – 2014-10-03 (×2): 10 mg via ORAL
  Filled 2014-10-01 (×3): qty 1

## 2014-10-01 MED ORDER — HEPARIN SODIUM (PORCINE) 5000 UNIT/ML IJ SOLN
5000.0000 [IU] | Freq: Three times a day (TID) | INTRAMUSCULAR | Status: DC
  Administered 2014-10-02 – 2014-10-03 (×3): 5000 [IU] via SUBCUTANEOUS
  Filled 2014-10-01 (×3): qty 1

## 2014-10-01 MED ORDER — LEVOTHYROXINE SODIUM 25 MCG PO TABS
137.0000 ug | ORAL_TABLET | Freq: Every day | ORAL | Status: DC
  Administered 2014-10-02 – 2014-10-03 (×2): 137 ug via ORAL
  Filled 2014-10-01 (×2): qty 1

## 2014-10-01 MED ORDER — ONDANSETRON HCL 4 MG/2ML IJ SOLN: 4.0000 mg | Freq: Four times a day (QID) | INTRAMUSCULAR | Status: DC | PRN

## 2014-10-01 MED ORDER — DEXTROSE-NACL 5-0.45 % IV SOLN
INTRAVENOUS | Status: DC
  Administered 2014-10-01: 17:00:00 via INTRAVENOUS

## 2014-10-01 MED ORDER — ONDANSETRON HCL 4 MG PO TABS: 4.0000 mg | ORAL_TABLET | Freq: Four times a day (QID) | ORAL | Status: DC | PRN

## 2014-10-01 MED ORDER — INSULIN ASPART 100 UNIT/ML ~~LOC~~ SOLN
0.0000 [IU] | Freq: Three times a day (TID) | SUBCUTANEOUS | Status: DC
Start: 2014-10-02 — End: 2014-10-02

## 2014-10-01 MED ORDER — SODIUM CHLORIDE 0.9 % IV SOLN
INTRAVENOUS | Status: DC
  Administered 2014-10-01 – 2014-10-03 (×6): via INTRAVENOUS

## 2014-10-01 MED ORDER — ACETAMINOPHEN 650 MG RE SUPP: 650.0000 mg | Freq: Four times a day (QID) | RECTAL | Status: DC | PRN

## 2014-10-01 MED ORDER — ACETAMINOPHEN 325 MG PO TABS: 650.0000 mg | ORAL_TABLET | Freq: Four times a day (QID) | ORAL | Status: DC | PRN

## 2014-10-01 NOTE — Plan of Care (Signed)
Problem: Discharge Progression Outcomes Goal: Complications resolved/controlled 5. Medical history: hypothyroidism controlled by home meds.

## 2014-10-01 NOTE — ED Notes (Signed)
D51/2NS started. Dr. Mindi JunkerGottlieb notified of CBG of 35.  Pt given food and drink. Family at bedside. Patient alert and oriented x 4 family at bedside.

## 2014-10-01 NOTE — H&P (Signed)
[facility] [place] NAME: [name]    MR#:  [id number]  DATE OF BIRTH:  1946-04-05  DATE OF ADMISSION:  10/01/2014  PRIMARY CARE PHYSICIAN: No primary care provider on file.   REQUESTING/REFERRING PHYSICIAN: Dr. Charlotte Sanes  CHIEF COMPLAINT:   Hypoglycemia HISTORY OF PRESENT ILLNESS:  [name]  is a 69 y.o. female with a known history of type 1 diabetes on insulin pump and hypothyroidism who presents with persistent hypoglycemia. Patient had a follow-up with her primary care physician Dr. Purnell Shoemaker for hypoglycemia which has been occurring over the past several weeks. Dr. Elisabeth Cara and her have been working on her insulin pump. At times she'll have high blood pressure sugars and at other times her blood sugars are low. Her blood sugar was severely low and persistent to Dr. Elisabeth Cara yesterday come to the ER for further evaluation. She was actually seen just a few days ago in the ER for hyperkalemia. Here in the ER her blood sugars are dropping she is now placed on a D5 drip at 150 cc an hour.  PAST MEDICAL HISTORY:   Past Medical History  Diagnosis Date  . Diabetes mellitus type 1     (prior saw Dr. Moshe Cipro endo, would like to establish locally)  . Generalized headaches     frequent  . Hypothyroidism   . Convulsions/seizures     unknown type, nml EEG  . History of chicken pox   . Anemia   . Vitamin B12 deficiency 03/2012    normal IF    PAST SURGICAL HISTOIRY:   Past Surgical History  Procedure Laterality Date  . Cesarean section  1983    SOCIAL HISTORY:   History  Substance Use Topics  . Smoking status: Never Smoker   . Smokeless tobacco: Never Used  . Alcohol Use: Yes     Comment: Socially (on weekends)    FAMILY HISTORY:   Family History  Problem Relation Age of Onset  . Stroke Mother   . Hypertension Mother   . Hypertension Father   . Coronary artery disease Father 47    MI  . Hypertension Sister   .  Diabetes Cousin     type 1  . Cancer Maternal Aunt     brain  . Hyperlipidemia Brother     DRUG ALLERGIES:  No Known Allergies  REVIEW OF SYSTEMS:  CONSTITUTIONAL: No fever, fatigue or weakness.  EYES: No blurred or double vision.  EARS, NOSE, AND THROAT: No tinnitus or ear pain.  RESPIRATORY: No cough, shortness of breath, wheezing or hemoptysis.  CARDIOVASCULAR: No chest pain, orthopnea, edema.  GASTROINTESTINAL: No nausea, vomiting, diarrhea or abdominal pain.  GENITOURINARY: No dysuria, hematuria.  ENDOCRINE: No polyuria, nocturia,  HEMATOLOGY: No anemia, easy bruising or bleeding SKIN: No rash or lesion. MUSCULOSKELETAL: No joint pain or arthritis.   NEUROLOGIC: No tingling, numbness, weakness.  PSYCHIATRY: No anxiety or depression.   MEDICATIONS AT HOME:   Prior to Admission medications   Medication Sig Start Date End Date Taking? Authorizing Provider  Blood Glucose Monitoring Suppl (ONE TOUCH ULTRA 2) W/DEVICE KIT Use as instructed. 07/19/14  Yes Historical Provider, MD  Cyanocobalamin (VITAMIN B12 PO) Take 800 mcg by mouth daily.   Yes Historical Provider, MD  insulin lispro (HUMALOG) 100 UNIT/ML injection Inject into the skin. Patient is using this with her Insulin Pump.   Yes Historical Provider, MD  levothyroxine (SYNTHROID, LEVOTHROID) 137 MCG tablet TAKE ONE TABLET BY  MOUTH ONCE DAILY BEFORE  BREAKFAST 05/23/13  Yes Ria Bush, MD  loratadine (CLARITIN) 10 MG tablet Take 10 mg by mouth daily.   Yes Historical Provider, MD  Insulin Glulisine (APIDRA IJ) Inject as directed as directed. In insulin pump and on sliding scale (1 unit for every 15 grams)    Historical Provider, MD      VITAL SIGNS:  Blood pressure 129/72, pulse 68, height _0  (1.549 m), weight 55.339 kg (122 lb), SpO2 100 %.  PHYSICAL EXAMINATION:  GENERAL:  69 y.o.-year-old patient lying in the bed with no acute distress.  EYES: Pupils equal, round, reactive to light and accommodation. No  scleral icterus. Extraocular muscles intact.  HEENT: Head atraumatic, normocephalic. Oropharynx and nasopharynx clear.  NECK:  Supple, no jugular venous distention. No thyroid enlargement, no tenderness.  LUNGS: Normal breath sounds bilaterally, no wheezing, rales,rhonchi or crepitation. No use of accessory muscles of respiration.  CARDIOVASCULAR: S1, S2 normal. No murmurs, rubs, or gallops.  ABDOMEN: Soft, nontender, nondistended. Bowel sounds present. No organomegaly or mass.  EXTREMITIES: No pedal edema, cyanosis, or clubbing.  NEUROLOGIC: Cranial nerves II through XII are intact. Muscle strength 5/5 in all extremities. Sensation intact. Gait not checked.  PSYCHIATRIC: The patient is alert and oriented x 3.  SKIN: No obvious rash, lesion, or ulcer.   LABORATORY PANEL:   CBC  Recent Labs Lab 10/01/14 1400  WBC 7.7  HGB 14.1  HCT 43.4  PLT 221   ------------------------------------------------------------------------------------------------------------------  Chemistries   Recent Labs Lab 10/01/14 1400  NA 133*  K 5.1  CL 103  CO2 21*  GLUCOSE 25*  BUN 20  CREATININE 1.27*  CALCIUM 9.7  AST 92*  ALT 43  ALKPHOS 112  BILITOT 0.6   ------------------------------------------------------------------------------------------------------------------  Cardiac Enzymes No results for input(s): TROPONINI in the last 168 hours. ------------------------------------------------------------------------------------------------------------------  RADIOLOGY:  No results found.  EKG:   Orders placed or performed during the hospital encounter of 10/01/14  . ED EKG  . ED EKG    IMPRESSION AND PLAN:   69 year old female brought to the ER by wheelchair from her PCPs office with hypoglycemia, persistent.  1. Hypoglycemia: Likely from dietary intake that is in adequate. Patient does endorse a 10 pound weight loss over the past several weeks due to the fact the patient was  feeling ill over the past several weeks. I will consult Dr. Elisabeth Cara for further evaluation and treatment. For now we have stopped her insulin pump. She was placed on a sliding scale insulin. We will continue with Accu-Cheks every 2 hours. She has no signs of infection. 2. Hypothyroidism: We'll continue Synthroid. All the records are reviewed and case discussed with ED provider. Management plans discussed with the patient, family and they are in agreement.  CODE STATUS: Full  TOTAL TIME TAKING CARE OF THIS PATIENT: 45 minutes.    Matin Mattioli M.D on 10/01/2014 at 5:14 PM  Between 7am to 6pm - Pager - [phone] After 6pm go to [email or web address] - password [email or web address] [facility]  [facility]  Office  [phone]  CC: Primary care physician; No primary care provider on file.

## 2014-10-01 NOTE — ED Provider Notes (Signed)
[facility] Emergency Department Provider Note    ____________________________________________  Time seen: 1250  I have reviewed the triage vital signs and the nursing notes.   HISTORY  Chief Complaint Hypoglycemia   Chief complaint; Weakness Low blood sugar    HPI [name] is a 69 y.o. female presents to the emergency room via wheelchair from the [facility]. She had a follow-up visit today with her primary care Dr. Sherrlyn Hock for hypoglycemia which has been occurring over the past several weeks on a daily basis. Today when she presented to the clinic she was hypoglycemic with a sugar at 22 and brought to the emergency department. She she has had several visits to her primary care doctor over the past week. She was seen on Wednesday which is 6 days ago for a follow-up 3 month check at that time she reported to her PMD that her sugars have been running low 2 times a week before her insulin pump was adjusted the dose of her basal insulin was decreased she in addition to the insulin pump takes NPH at bedtime. She was rechecked on Friday which was 4 days ago at that time she was diagnosed by her PMD with hyperkalemia and was sent to the emergency department to have her potassium treated she was dismissed from the emergency department 2 days later on Sunday which was 2 days ago her sugars started to drop again and she went to her primary care doctor's office this morning in the doctor's office her sugar was in the 20's the insulin pump was turned off and she was brought immediately to the emergency department  . She states she hasn't been eating well for the past several days this morning's she had some crackers and some apple juice.   . She still has her insulin pump plugged then although she says she has not been giving herself any insulin via the pump since she left the PMDs office this morning. In the emergency department the pump was disconnected and put  into her pocket book.  She was also diagnosed with a suspected urinary tract infection when she was at her PMDs office last week.    .    Past Medical History  Diagnosis Date  . Diabetes mellitus type 1     (prior saw Dr. Moshe Cipro endo, would like to establish locally)  . Generalized headaches     frequent  . Hypothyroidism   . Convulsions/seizures     unknown type, nml EEG  . History of chicken pox   . Anemia   . Vitamin B12 deficiency 03/2012    normal IF    Patient Active Problem List   Diagnosis Date Noted  . Viral URI with cough 07/10/2013  . Medicare annual wellness visit, initial 06/19/2012  . Vitamin B12 deficiency 06/11/2012  . Anemia 04/18/2012  . Fall from bed 03/14/2012  . EDEMA, HANDS 01/02/2010  . HYPOTHYROIDISM 03/22/2007  . DIABETES MELLITUS, TYPE I 03/22/2007  . HYPERLIPIDEMIA 03/22/2007    Past Surgical History  Procedure Laterality Date  . Cesarean section  1983    Current Outpatient Rx  Name  Route  Sig  Dispense  Refill  . Blood Glucose Monitoring Suppl (ONE TOUCH ULTRA 2) W/DEVICE KIT      Use as instructed.         . Cyanocobalamin (VITAMIN B12 PO)   Oral   Take 800 mcg by mouth daily.         Marland Kitchen  insulin lispro (HUMALOG) 100 UNIT/ML injection   Subcutaneous   Inject into the skin. Patient is using this with her Insulin Pump.         Marland Kitchen levothyroxine (SYNTHROID, LEVOTHROID) 137 MCG tablet      TAKE ONE TABLET BY MOUTH ONCE DAILY BEFORE  BREAKFAST   30 tablet   3   . loratadine (CLARITIN) 10 MG tablet   Oral   Take 10 mg by mouth daily.         . Insulin Glulisine (APIDRA IJ)   Injection   Inject as directed as directed. In insulin pump and on sliding scale (1 unit for every 15 grams)           Allergies Review of patient's allergies indicates no known allergies.  Family History  Problem Relation Age of Onset  . Stroke Mother   . Hypertension Mother   . Hypertension Father   . Coronary artery disease Father 74     MI  . Hypertension Sister   . Diabetes Cousin     type 1  . Cancer Maternal Aunt     brain  . Hyperlipidemia Brother     Social History History  Substance Use Topics  . Smoking status: Never Smoker   . Smokeless tobacco: Never Used  . Alcohol Use: Yes     Comment: Socially (on weekends)    Review of Systems  Constitutional: Negative for fever. Eyes: Negative for visual changes. ENT: Negative for sore throat. Cardiovascular: Negative for chest pain. Respiratory: Negative for shortness of breath. Gastrointestinal: Negative for abdominal pain, vomiting and diarrhea. Genitourinary: Negative for dysuria. Musculoskeletal: Negative for back pain. Skin: Negative for rash. Neurological: Negative for headaches, focal weakness or numbness. As for generalized weakness and fatigue.   10-point ROS otherwise negative.  ____________________________________________   PHYSICAL EXAM:  VITAL SIGNS: ED Triage Vitals  Enc Vitals Group     BP --      Pulse --      Resp --      Temp --      Temp src --      SpO2 --      Weight --      Height --      Head Cir --      Peak Flow --      Pain Score --      Pain Loc --      Pain Edu? --      Excl. in [place]? --     On arrival to the emergency department she is drinking an apple juice box she is awake and alert Accu-Chek is checked in the emergency department the first 1 is 22 the second one while she is drinking juices 30 she remains awake at this level sent which is being provided and the blood glucose will be checked again another 10 minutes the nurses is staying by bedside.  Her insulin pump was removed and placed in her pocketbook.   Constitutional: Alert and oriented. Well appearing and in no distress. Eyes: Conjunctivae are normal. PERRL. Normal extraocular movements. ENT   Head: Normocephalic and atraumatic.   Nose: No congestion/rhinnorhea.   Mouth/Throat: Mucous membranes are moist.   Neck: No  stridor. Hematological/Lymphatic/Immunilogical: No cervical lymphadenopathy. Cardiovascular: Normal rate, regular rhythm. Normal and symmetric distal pulses are present in all extremities. No murmurs, rubs, or gallops. Respiratory: Normal respiratory effort without tachypnea nor retractions. Breath sounds are clear and equal bilaterally. No wheezes/rales/rhonchi. Gastrointestinal: Soft  and nontender. No distention. No abdominal bruits. There is no CVA tenderness. Genitourinary: Deferred Musculoskeletal: Nontender with normal range of motion in all extremities. No joint effusions.  No lower extremity tenderness nor edema. Neurologic:  Normal speech and language. No gross focal neurologic deficits are appreciated. Speech is normal. No gait instability. Skin:  Skin is warm, dry and intact. No rash noted. Psychiatric: Mood and affect are normal. Speech and behavior are normal. Patient exhibits appropriate insight and judgment.  ____________________________________________    LABS (pertinent positives/negatives) ----------------------------------------- 4:51 PM on 10/01/2014 -----------------------------------------   Hourly Accu-Chek was done in the emergency department and she remained hypoglycemic despite eating and drinking in the ED additional laboratory evaluation in the ER includes a CMP with abnormalities being sodium which is low at 133 CO2 which is low at 21 glucose which is low at 25 creatinine which is high at 1.27 and SGOT which is high at 90 to a low GFR 43 CBC with differential in the ER is normal abnormal CBGs can be viewed on the nurse's notes they dropped as low as 27 as high as 47 despite eating in the emergency department a urine analysis in the ear reveals urine that his urine that is yellow hazy with 1+ leukocytes but no bacteria seen in the urine negative protein negative nitrites  ____________________________________________   EKG  ED ECG REPORT   Date: 10/01/2014   EKG Time: 1420 2 PM  Rate: Normal sinus rhythm  Rhythm: normal EKG, normal sinus rhythm, unchanged from previous tracings  Axis: Normal  Intervals:nonspecific intraventricular conduction delay  ST&T Change: Nonspecific ST-T wave changes   ____________________________________________    RADIOLOGY  No imaging was done in the emergency department.  ____________________________________________   PROCEDURES  Procedure(s) performed: None  Critical Care performed: No  ____________________________________________   INITIAL IMPRESSION / ASSESSMENT AND PLAN / ED COURSE  Pertinent labs & imaging results that were available during my care of the patient were reviewed by me and considered in my medical decision making (see chart for details).  Impression: 69 year old female [facility] acute onset of recurring hypoglycemia. My impression is that her dietary intake is inadequate and she may be on too much insulin given her present situation of recurrent hypoglycemia. 2 I will also check for any underlying causes of infection. IV fluid and food were provided in the emergency department. Blood glucose checking will be done on an hourly basis. Laboratory evaluation including a UA to determine infection coldly will be done in the ER. She she will return to the [facility] to follow-up with her primary care doc.  ----------------------------------------- 4:53 PM on 10/01/2014 -----------------------------------------  Blood glucose checking in the emergency department revealed a persistent hypoglycemia with sugars ranging from 27-32-20 07/02/1944 despite being awake alert and conversive in the emergency department. Confused at one point when her sugar was 22 and she apparently took her insulin pump out of her pocketbook and he plugged it into her indwelling abdominal needle but she states that the insulin pump was not turned on this was at 1300.  I removed the insulin pump from the room and took it with me for the rest of the ER visit. X  An IV of D5 half normal saline was started in addition to providing another meal. Her family remains in the room with her to monitor her level of awakeness and it she will be admitted to the hospital service for further evaluation and  treatment. Dr. Marcille Blanco has been consulted in the emergency department for admission. ___----------------------------------------- 5:02 PM on 10/01/2014 -----------------------------------------  I spoke with Dr. Genia Harold about the admission she was she will admit the patient from the emergency department. _________________________________________   FINAL CLINICAL IMPRESSION(S) / ED DIAGNOSES  Final diagnoses:  Hypoglycemia     Boris Lown, DO 10/01/14 1702

## 2014-10-02 LAB — BASIC METABOLIC PANEL
Anion gap: 6 (ref 5–15)
Anion gap: 5 (ref 5–15)
BUN: 16 mg/dL (ref 6–20)
BUN: 16 mg/dL (ref 6–20)
CHLORIDE: 105 mmol/L (ref 101–111)
CO2: 19 mmol/L — AB (ref 22–32)
CO2: 19 mmol/L — ABNORMAL LOW (ref 22–32)
CREATININE: 1.13 mg/dL — AB (ref 0.44–1.00)
Calcium: 8.5 mg/dL — ABNORMAL LOW (ref 8.9–10.3)
Calcium: 8.3 mg/dL — ABNORMAL LOW (ref 8.9–10.3)
Chloride: 104 mmol/L (ref 101–111)
Creatinine, Ser: 1.04 mg/dL — ABNORMAL HIGH (ref 0.44–1.00)
GFR calc non Af Amer: 49 mL/min — ABNORMAL LOW (ref >60)
GFR, EST AFRICAN AMERICAN: 57 mL/min — AB (ref >60)
GFR, EST NON AFRICAN AMERICAN: 54 mL/min — AB (ref >60)
Glucose, Bld: 243 mg/dL — ABNORMAL HIGH (ref 65–99)
Glucose, Bld: 320 mg/dL — ABNORMAL HIGH (ref 65–99)
POTASSIUM: 5.2 mmol/L — AB (ref 3.5–5.1)
POTASSIUM: 5.8 mmol/L — AB (ref 3.5–5.1)
SODIUM: 128 mmol/L — AB (ref 135–145)
Sodium: 130 mmol/L — ABNORMAL LOW (ref 135–145)

## 2014-10-02 LAB — GLUCOSE, CAPILLARY
GLUCOSE-CAPILLARY: 292 mg/dL — AB (ref 70–99)
GLUCOSE-CAPILLARY: 294 mg/dL — AB (ref 70–99)
GLUCOSE-CAPILLARY: 331 mg/dL — AB (ref 70–99)
GLUCOSE-CAPILLARY: 70 mg/dL (ref 70–99)
Glucose-Capillary: 391 mg/dL — ABNORMAL HIGH (ref 70–99)
Glucose-Capillary: 274 mg/dL — ABNORMAL HIGH (ref 70–99)
Glucose-Capillary: 63 mg/dL — ABNORMAL LOW (ref 70–99)
Glucose-Capillary: 297 mg/dL — ABNORMAL HIGH (ref 70–99)

## 2014-10-02 LAB — POTASSIUM
Potassium: 5.4 mmol/L — ABNORMAL HIGH (ref 3.5–5.1)
Potassium: 4.7 mmol/L (ref 3.5–5.1)

## 2014-10-02 MED ORDER — SODIUM POLYSTYRENE SULFONATE 15 GM/60ML PO SUSP
30.0000 g | Freq: Once | ORAL | Status: AC
  Administered 2014-10-02: 30 g via ORAL
  Filled 2014-10-02: qty 120

## 2014-10-02 MED ORDER — INSULIN PUMP
Freq: Three times a day (TID) | SUBCUTANEOUS | Status: DC
  Administered 2014-10-02: 20:00:00 via SUBCUTANEOUS
  Filled 2014-10-02 (×3): qty 1

## 2014-10-02 MED ORDER — INSULIN ASPART 100 UNIT/ML ~~LOC~~ SOLN
0.0000 [IU] | Freq: Three times a day (TID) | SUBCUTANEOUS | Status: DC
Start: 2014-10-02 — End: 2014-10-02

## 2014-10-02 MED ORDER — INSULIN ASPART 100 UNIT/ML ~~LOC~~ SOLN: 10.0000 [IU] | Freq: Once | SUBCUTANEOUS | Status: DC

## 2014-10-02 MED ORDER — INSULIN ASPART 100 UNIT/ML IV SOLN
10.0000 [IU] | Freq: Once | INTRAVENOUS | Status: AC
  Administered 2014-10-02: 10 [IU] via INTRAVENOUS
  Filled 2014-10-02: qty 0.1

## 2014-10-02 MED ORDER — INSULIN ASPART 100 UNIT/ML ~~LOC~~ SOLN
0.0000 [IU] | Freq: Three times a day (TID) | SUBCUTANEOUS | Status: DC
  Administered 2014-10-02: 09:00:00 5 [IU] via SUBCUTANEOUS
  Administered 2014-10-02: 02:00:00 4 [IU] via SUBCUTANEOUS
  Administered 2014-10-02: 12:00:00 3 [IU] via SUBCUTANEOUS
  Filled 2014-10-02: qty 4
  Filled 2014-10-02: qty 3
  Filled 2014-10-02: qty 5

## 2014-10-02 MED ORDER — DEXTROSE 50 % IV SOLN
1.0000 | Freq: Once | INTRAVENOUS | Status: AC
  Administered 2014-10-02: 15:00:00 50 mL via INTRAVENOUS
  Filled 2014-10-02: qty 50

## 2014-10-02 NOTE — Progress Notes (Signed)
Pt is here for hypoglycemia.  Nurse called, pt is off her insulin pump. At 1 pm BS was 274, - without  Receiving any insulin- her sugar dropped to 60-70 now at 5 pm. Family don't feel comfortable in taking pt home with this dropping blood sugar.  Will Cancel discharge tonight- and re address the issue tomorrow.

## 2014-10-02 NOTE — Progress Notes (Signed)
Inpatient Diabetes Program Recommendations  AACE/ADA: New Consensus Statement on Inpatient Glycemic Control (2013)  Target Ranges:  Prepandial:   less than 140 mg/dL      Peak postprandial:   less than 180 mg/dL (1-2 hours)      Critically ill patients:  140 - 180 mg/dL   Reason for Visit:  Note events from 10/01/14.  Patient admitted with persistent hypoglycemia.  She has a medtronic insulin pump and has had Type 1 diabetes for 20 years.  Note that patient was removed from insulin pump and is currently only on subcutaneous Novolog correction.   Results for [name], [name] (MRN [id number]) as of 10/02/2014 11:15  Ref. Range 10/01/2014 19:21 10/01/2014 20:57 10/02/2014 00:35 10/02/2014 02:57 10/02/2014 08:17  Glucose-Capillary Latest Ref Range: 70-99 mg/dL 409105 (H) 811166 (H) 914331 (H) 294 (H) 391 (H)   Diabetes history: Type 1 diabetes Outpatient Diabetes medications: Medtronic insulin pump Current orders for Inpatient glycemic control:  Novolog correction 151-200 mg/dL-1 units, [phone] mg/dL-2 units, 213-086251-300 mg/dL-3 units, 578-469301-350 mg/dL-4 units, 629-528351-400 mg/dL- 5 units  CBG was just rechecked and she has come down to 292 mg/dL.  Consult has been called to Dr. Tedd SiasSolum.  I also have called her office to see if patient should resume insulin pump.  Daughter is going home to get insulin pump now.  Awaiting call back from Dr. Tedd SiasSolum.  Discussed with Dr. Juliene PinaMody also.    Thanks, Beryl MeagerJenny Mckaila Duffus, RN, BC-ADM Inpatient Diabetes Coordinator Pager [phone] (8a-5p)

## 2014-10-02 NOTE — Care Management (Signed)
Admitted to [facility] with the diagnosis of hypoglycemia. Lives with husband, [place]. Daughter is Toniann FailWendy [phone]). [facility] from [facility] comes yearly to complete a physical assessment. Sees Dr. Tedd SiasSolum. Last seen yesterday. No skilled facility. No home oxygen. Takes care of all activities of daily living herself, still drives.  Husband or daughter will transport. Gwenette GreetBrenda S Holland RN MSN Care Management [phone]

## 2014-10-02 NOTE — Progress Notes (Signed)
Spoke with Dr. Anne HahnWillis about blood sugar recheck being 294.  Dr. Anne HahnWillis ok with blood sugar, no new orders to be added.

## 2014-10-02 NOTE — Progress Notes (Signed)
Patient blood glucose trending down and patient did not resumed insulin pump.  Kayexalate given and patient had several loose incontinent stools.  Patients daughter concerned about discharge and stated that she would drive [facility] if we discharged her.  Patient reluctant to resume insulin pump.  Dr. Tedd SiasSolum paged but she did not call back.  Dr. Esaw GrandchildVachanni contacted and patients discharge suspended.

## 2014-10-02 NOTE — Plan of Care (Signed)
Problem: Discharge Progression Outcomes Goal: Other Discharge Outcomes/Goals Plan of Care Progress to Goal:  Glucose has stablized. IVF's infusing. Denies co's/concerns currently. Denies pain.  Outcome: Progressing Plan of care progress to goal for: Pain-no c/o pain this shift Hemodynamically-NS @ 18250ml/hr Complications-pt covered with ss insulin Diet-pt did not eat during this shift Activity-pt use to bathroom with assistance

## 2014-10-02 NOTE — Progress Notes (Signed)
[facility] [place] [facility]   PATIENT NAME: [name]    MR#:  [id number]  DATE OF BIRTH:  1946/03/28  SUBJECTIVE:  Blood sugars high this am  REVIEW OF SYSTEMS:  CONSTITUTIONAL: No fever, fatigue or weakness.  RESPIRATORY: No cough, shortness of breath, wheezing or hemoptysis.  CARDIOVASCULAR: No chest pain, orthopnea, edema.  GASTROINTESTINAL: No nausea, vomiting, diarrhea or abdominal pain.  GENITOURINARY: No dysuria, hematuria.  MUSCULOSKELETAL: No joint pain or arthritis.   NEUROLOGIC: No tingling, numbness, weakness.    DRUG ALLERGIES:  No Known Allergies  VITALS:  Blood pressure 98/44, pulse 79, temperature 98.4 F (36.9 C), temperature source Oral, resp. rate 16, height 5\' 1"  (1.549 m), weight 57.471 kg (126 lb 11.2 oz), SpO2 100 %.  PHYSICAL EXAMINATION:  GENERAL:  69 y.o.-year-old patient lying in the bed with no acute distress.  EYES: Pupils equal, round, reactive to light and accommodation. No scleral icterus. Extraocular muscles intact.  HEENT: Head atraumatic, normocephalic. Oropharynx and nasopharynx clear.  NECK:  Supple, no jugular venous distention. No thyroid enlargement, no tenderness.  LUNGS: Normal breath sounds bilaterally, no wheezing, rales,rhonchi or crepitation. No use of accessory muscles of respiration.  CARDIOVASCULAR: S1, S2 normal. No murmurs, rubs, or gallops.  ABDOMEN: Soft, nontender, nondistended. Bowel sounds present. No organomegaly or mass.  EXTREMITIES: No pedal edema, cyanosis, or clubbing.  NEUROLOGIC: Cranial nerves II through XII are intact. Muscle strength 5/5 in all extremities. Sensation intact. Gait not checked.  PSYCHIATRIC: The patient is alert and oriented x 3.  SKIN: No obvious rash, lesion, or ulcer.    LABORATORY PANEL:   CBC  Recent Labs Lab 10/01/14 1400  WBC 7.7  HGB 14.1  HCT 43.4  PLT 221    ------------------------------------------------------------------------------------------------------------------  Chemistries   Recent Labs Lab 10/01/14 1400 10/02/14 0439  NA 133* 130*  K 5.1 5.2*  CL 103 105  CO2 21* 19*  GLUCOSE 25* 243*  BUN 20 16  CREATININE 1.27* 1.13*  CALCIUM 9.7 8.5*  AST 92*  --   ALT 43  --   ALKPHOS 112  --   BILITOT 0.6  --    ------------------------------------------------------------------------------------------------------------------  Cardiac Enzymes No results for input(s): TROPONINI in the last 168 hours. ------------------------------------------------------------------------------------------------------------------  RADIOLOGY:  No results found.    ASSESSMENT AND PLAN:   69 year old female brought to the ER by wheelchair from her PCPs office with hypoglycemia, persistent.  1. Hypoglycemia: Likely from dietary intake that is inadequate along with weight loss. Patient does endorse a 10 pound weight loss over the past several weeks due to the fact the patient was feeling ill over the past several weeks. I D5 has been stopped as her blood sugars are high. I am awaiting Dr. Tedd SiasSolum consult for further evaluation and management.  2. Hypothyroidism: Continue Synthroid 3. Hyponatremia from elevated blood sugars. I am following  4. Hyperkalemia from elevated blood sugars. I will repeat BMP     All the records are reviewed and case discussed with Care Management/Social Workerr. Management plans discussed with the patient and she is in agreement.  CODE STATUS: full  TOTAL TIME TAKING CARE OF THIS PATIENT: 31 minutes.   POSSIBLE D/C IDEPENDING ON Consult   Kathlyne Loud M.D on 10/02/2014 at 12:05 PM  Between 7am to 6pm - Pager - [phone]  After 6pm go to [email or web address] - password [email or web address] [facility]  [facility]  Office  [phone]  CC: Primary care physician; No primary care provider  on file.

## 2014-10-02 NOTE — Progress Notes (Addendum)
Note that pt. Is still here awaiting follow-up labs.  Called and spoke with Dr. Juliene PinaMody who states that she can resume insulin pump.  Patient states that she needs a vial of Novolog for refill of insulin pump.  Will request from pharmacy.  Discussed with RN and patient and orders placed per MD orders. Asked patient to call RN as soon as pump arrives so that she can restart.  Thanks, Beryl MeagerJenny Rhylynn Perdomo, RN, BC-ADM Inpatient Diabetes Coordinator Pager [phone] (8a-5p)

## 2014-10-02 NOTE — Progress Notes (Signed)
Spoke with Dr. Anne HahnWillis about BS of 331.  MD to put in new orders.

## 2014-10-02 NOTE — Consult Note (Signed)
ENDOCRINOLOGY CONSULTATION  REFERRING PHYSICIAN: Adrian SaranSital Mody, MD CONSULTING PHYSICIAN:  A. Wendall MolaMelissa Solum, MD.  CHIEF COMPLAINT:  Diabetic mellitus  HISTORY OF PRESENT ILLNESS:  69 y.o. female seen in consultation for diabetes mellitus, type 1. She was sent to [facility] ER yesterday from my clinic for severe hypoglycemia. She had been seen by the [employer] representative for review of her insulin pump use. Pump had been restarted last Friday, after she stopped using it for a period of about 2 months. She insisted she had not taken any insulin the morning of the injection. Initial BG in clinic was in the 30s. Insulin pump was suspended. She was given 2 juice boxes and peanut butter crackers. Over the next 45 min, BG was in the 20-30 range and she had N/V so was sent to the ER. Despite treatment in the ER with a meal and juice, she had persistent hyopglycemia and was admitted. Sugars gradually improved. Today sugars are in the 200-300 range. She is eating a full diet. Denies N/V. Her insulin pump was removed last night and taken home by family. She is interested in restarting the pump. She prefers the pump to subQ injections.   PAST MEDICAL HISTORY:  Past Medical History  Diagnosis Date  . Diabetes mellitus type 1     Vitamin B12 deficiency  . Generalized headaches     frequent  . Hypothyroidism     CURRENT MEDICATIONS:  . heparin  5,000 Units Subcutaneous 3 times per day  . insulin aspart  0-5 Units Subcutaneous TID AC & HS  . insulin aspart  10 Units Subcutaneous Once  . levothyroxine  137 mcg Oral QAC breakfast  . loratadine  10 mg Oral Daily  . sodium polystyrene  30 g Oral Once     SOCIAL HISTORY:  History  Substance Use Topics  . Smoking status: Never Smoker   . Smokeless tobacco: Never Used  . Alcohol Use: 0.6 oz/week    1 Glasses of wine, 0 Cans of beer, 0 Shots of liquor per week     Comment: Socially (on weekends)     FAMILY HISTORY:   Family History  Problem Relation  Age of Onset  . Stroke Mother   . Hypertension Mother   . Hypertension Father   . Coronary artery disease Father [id number]    MI  . Hypertension Sister   . Diabetes Cousin     type 1  . Cancer Maternal Aunt     brain  . Hyperlipidemia Brother      ALLERGIES:  No Known Allergies  REVIEW OF SYSTEMS:  GENERAL:  No weight loss.  No fever.  HEENT:  No blurred vision. No sore throat.  NECK:  No neck pain or dysphagia.  CARDIAC:  No chest pain or palpitation.  PULMONARY:  No cough or shortness of breath.  ABDOMEN:  No abdominal pain.  No constipation. EXTREMITIES:  No lower extremity swelling.  ENDOCRINE:  No heat or cold intolerance.  GENITOURINARY:  No dysuria or hematuria. SKIN:  No recent rash or skin changes.   PHYSICAL EXAMINATION:  BP 98/44 mmHg  Pulse 79  Temp(Src) 98.4 F (36.9 C) (Oral)  Resp 16  Ht 5\' 1"  (1.549 m)  Wt 57.471 kg (126 lb 11.2 oz)  BMI 23.95 kg/m2  SpO2 100%  GENERAL:  Well-developed female in NAD. HEENT:  EOMI.  Oropharynx is clear.  NECK:  Supple.  No thyromegaly.  No neck tenderness.  CARDIAC:  Regular rate and rhythm  without murmur.  PULMONARY:  Clear to auscultation bilaterally.  ABDOMEN:  Diffusely soft, nontender, nondistended.  EXTREMITIES:  No peripheral edema is present.    SKIN:  No rash or dermatopathy. NEUROLOGIC:  No dysarthria.  No tremor. PSYCHIATRIC:  Alert and oriented, calm, cooperative.   ASSESSMENT:  1. Diabetes mellitus, type 1  2.   Type 1 diabetes with hypoglycemia 3.    Use of insulin pump.  PLAN: 1. Counseled patient about BG goals. 2. As hypoglycemia has resolved, I advised her to restart pump. She does not have her pump here. She indicates she can have her daughter bring it in or she can go home and put it on. I recommend she do whichever is quicker for her. We reviewed her pump setting changes which were made yesterday. 3. She will return to my clinic in 2 days for next follow up appointment.

## 2014-10-02 NOTE — Discharge Summary (Addendum)
[facility] [place] NAME: [name]    MR#:  [id number]  DATE OF BIRTH:  [birth year]  DATE OF ADMISSION:  10/01/2014 ADMITTING PHYSICIAN: Bettey Costa, MD  DATE OF DISCHARGE: 10/02/2014 PRIMARY CARE PHYSICIAN: Dr. Gabriel Carina  ADMISSION DIAGNOSIS:  Hypoglycemia [E16.2]  DISCHARGE DIAGNOSIS:  Active Problems:   Hypoglycemia   Diabetes   SECONDARY DIAGNOSIS:   Past Medical History  Diagnosis Date  . Diabetes mellitus type 1     (prior saw Dr. Moshe Cipro endo, would like to establish locally)  . Generalized headaches     frequent  . Hypothyroidism   . Convulsions/seizures     unknown type, nml EEG  . History of chicken pox   . Anemia   . Vitamin B12 deficiency 03/2012    normal IF    HOSPITAL COURSE:  HPI:  [name] is a 69 y.o. female with a known history of type 1 diabetes on insulin pump and hypothyroidism who presents with persistent hypoglycemia. Patient had a follow-up with her primary care physician Dr. Purnell Shoemaker for hypoglycemia which has been occurring over the past several weeks. Dr. Elisabeth Cara and her have been working on her insulin pump. At times she'll have high blood pressure sugars and at other times her blood sugars are low. Her blood sugar was severely low and persistent to Dr. Elisabeth Cara yesterday come to the ER for further evaluation. She was actually seen just a few days ago in the ER for hyperkalemia.   1. Hypoglycemia: Likely from dietary intake that is inadequate along with weight loss. Patient does endorses a 10 pound weight loss over the past several weeks due to the fact the patient was feeling ill over the past several weeks.  D5 has been stopped as her blood sugars were high. I am awaiting Dr. Gabriel Carina consulted for further evaluation and management. As per her recommendation, patient will continue with her insulin pump and follow up with Dr. Gabriel Carina on Friday. 2. Hypothyroidism: Continue Synthroid 3.  Hyponatremia from elevated blood sugars.  4. Hyperkalemia from elevated blood sugars : This was treated prior to discharge.  5. Hypertension: Patient's blood pressure was low. She was asymptomatic. Her blood pressures improved with IV fluids. She will need close monitoring as an outpatient for blood pressure.   DISCHARGE CONDITIONS AND DIET:   Stable  ADA  CONSULTS OBTAINED:   ENDOCRINE  DRUG ALLERGIES:  No Known Allergies  DISCHARGE MEDICATIONS:   Current Discharge Medication List    CONTINUE these medications which have NOT CHANGED   Details  Blood Glucose Monitoring Suppl (ONE TOUCH ULTRA 2) W/DEVICE KIT Use as instructed.    Cyanocobalamin (VITAMIN B12 PO) Take 800 mcg by mouth daily.    insulin lispro (HUMALOG) 100 UNIT/ML injection Inject into the skin. Patient is using this with her Insulin Pump.    Insulin Glulisine (APIDRA IJ) Inject as directed as directed. In insulin pump and on sliding scale (1 unit for every 15 grams)    levothyroxine (SYNTHROID, LEVOTHROID) 137 MCG tablet TAKE ONE TABLET BY MOUTH ONCE DAILY BEFORE  BREAKFAST Qty: 30 tablet, Refills: 3    loratadine (CLARITIN) 10 MG tablet Take 10 mg by mouth daily.         DISCHARGE INSTRUCTIONS:    If you experience worsening of your admission symptoms, develop shortness of breath, life threatening emergency, suicidal or homicidal thoughts you must seek medical attention immediately by calling 911 or calling your MD immediately  if symptoms less severe.  You Must read complete instructions/literature along with all the possible adverse reactions/side effects for all the Medicines you take and that have been prescribed to you. Take any new Medicines after you have completely understood and accept all the possible adverse reactions/side effects.   Please note  You were cared for by a hospitalist during your hospital stay. If you have any questions about your discharge medications or the care you received  while you were in the hospital after you are discharged, you can call the unit and asked to speak with the hospitalist on call if the hospitalist that took care of you is not available. Once you are discharged, your primary care physician will handle any further medical issues. Please note that NO REFILLS for any discharge medications will be authorized once you are discharged, as it is imperative that you return to your primary care physician (or establish a relationship with a primary care physician if you do not have one) for your aftercare needs so that they can reassess your need for medications and monitor your lab values.    Today   CHIEF COMPLAINT:   Patient is feeling better. She was kept in the hospital overnight as her blood pressure was low. Her blood pressures improved. She wants to go home  VITAL SIGNS:  Blood pressure 98/44, pulse 79, temperature 98.4 F (36.9 C), temperature source Oral, resp. rate 16, height _0  (1.549 m), weight 57.471 kg (126 lb 11.2 oz), SpO2 100 %.   PHYSICAL EXAMINATION:  GENERAL:  69 y.o.-year-old patient lying in the bed with no acute distress.  NECK:  Supple, no jugular venous distention. No thyroid enlargement, no tenderness.  LUNGS: Normal breath sounds bilaterally, no wheezing, rales,rhonchi  No use of accessory muscles of respiration.  CARDIOVASCULAR: S1, S2 normal. No murmurs, rubs, or gallops.  ABDOMEN: Soft, non-tender, non-distended. Bowel sounds present. No organomegaly or mass.  EXTREMITIES: No pedal edema, cyanosis, or clubbing.  PSYCHIATRIC: The patient is alert and oriented x 3.  SKIN: No obvious rash, lesion, or ulcer.   DATA REVIEW:   CBC  Recent Labs Lab 10/01/14 1400  WBC 7.7  HGB 14.1  HCT 43.4  PLT 221    Chemistries   Recent Labs Lab 10/01/14 1400  10/02/14 1157  NA 133*  < > 128*  K 5.1  < > 5.8*  CL 103  < > 104  CO2 21*  < > 19*  GLUCOSE 25*  < > 320*  BUN 20  < > 16  CREATININE 1.27*  < > 1.04*   CALCIUM 9.7  < > 8.3*  AST 92*  --   --   ALT 43  --   --   ALKPHOS 112  --   --   BILITOT 0.6  --   --   < > = values in this interval not displayed.  Cardiac Enzymes No results for input(s): TROPONINI in the last 168 hours.  Microbiology Results  Results for orders placed or performed in visit on 07/31/13  Fecal occult blood, imunochemical     Status: None   Collection Time: 07/31/13 12:59 PM  Result Value Ref Range Status   Fecal Occult Bld Negative Negative Final    RADIOLOGY:  No results found.    Management plans discussed with the patient and she is in agreement. Stable for discharge home  CODE STATUS:     Code Status Orders  Start     Ordered   10/01/14 2055  Full code   Continuous     10/01/14 2054      TOTAL TIME TAKING CARE OF THIS PATIENT: 35 minutes.    Damyra Luscher M.D on 10/02/2014 at 1:04 PM  Between 7am to 6pm - Pager - [phone] After 6pm go to [email or web address] - password [email or web address] [facility]  [facility]  Office  [phone]  CC: Primary care physician; No primary care provider on file.

## 2014-10-03 LAB — GLUCOSE, CAPILLARY
GLUCOSE-CAPILLARY: 465 mg/dL — AB (ref 70–99)
GLUCOSE-CAPILLARY: 448 mg/dL — AB (ref 70–99)
GLUCOSE-CAPILLARY: 250 mg/dL — AB (ref 70–99)
Glucose-Capillary: 22 mg/dL — CL (ref 70–99)
Glucose-Capillary: <10 mg/dL — CL (ref 70–99)
Glucose-Capillary: <10 mg/dL — CL (ref 70–99)
Glucose-Capillary: 110 mg/dL — ABNORMAL HIGH (ref 70–99)
Glucose-Capillary: 419 mg/dL — ABNORMAL HIGH (ref 70–99)
Glucose-Capillary: 431 mg/dL — ABNORMAL HIGH (ref 70–99)
Glucose-Capillary: 435 mg/dL — ABNORMAL HIGH (ref 70–99)
Glucose-Capillary: 416 mg/dL — ABNORMAL HIGH (ref 70–99)
Glucose-Capillary: 226 mg/dL — ABNORMAL HIGH (ref 70–99)
Glucose-Capillary: 108 mg/dL — ABNORMAL HIGH (ref 70–99)

## 2014-10-03 LAB — BETA-HYDROXYBUTYRIC ACID: Beta-Hydroxybutyric Acid: 3.04 mmol/L — ABNORMAL HIGH (ref 0.05–0.27)

## 2014-10-03 MED ORDER — INSULIN PUMP
SUBCUTANEOUS | Status: DC
  Administered 2014-10-03: 14:00:00 via SUBCUTANEOUS
  Filled 2014-10-03: qty 1

## 2014-10-03 MED ORDER — INSULIN ASPART 100 UNIT/ML ~~LOC~~ SOLN
0.0000 [IU] | SUBCUTANEOUS | Status: DC
  Administered 2014-10-03: 5 [IU] via SUBCUTANEOUS

## 2014-10-03 MED ORDER — INSULIN ASPART 100 UNIT/ML ~~LOC~~ SOLN
15.0000 [IU] | Freq: Once | SUBCUTANEOUS | Status: DC
  Filled 2014-10-03: qty 15

## 2014-10-03 MED ORDER — INSULIN ASPART 100 UNIT/ML ~~LOC~~ SOLN
4.0000 [IU] | Freq: Once | SUBCUTANEOUS | Status: AC
  Administered 2014-10-03: 4 [IU] via SUBCUTANEOUS

## 2014-10-03 MED ORDER — INSULIN ASPART 100 UNIT/ML ~~LOC~~ SOLN: 1.0000 [IU] | Freq: Three times a day (TID) | SUBCUTANEOUS | Status: DC

## 2014-10-03 MED ORDER — INSULIN GLARGINE 100 UNIT/ML ~~LOC~~ SOLN
3.0000 [IU] | SUBCUTANEOUS | Status: DC
  Filled 2014-10-03: qty 0.03

## 2014-10-03 MED ORDER — CEFTRIAXONE SODIUM IN DEXTROSE 20 MG/ML IV SOLN: 1.0000 g | INTRAVENOUS | Status: DC

## 2014-10-03 NOTE — Progress Notes (Signed)
Alert and oriented, up independently, consulted with diabetic educator and with endocrinologist, insulin pump reapplied, FSBS remains stable, patient using insulin pump for insulin administration, pt is d/c to home, no vaccines indicated, no Rx on chart, no f/u appointments at d/c time however patient reports she has a follow up with Dr. Alesia MorinSolumn next Tuesday. Pt is pushed to visitor entrance via volunteer staff.

## 2014-10-03 NOTE — Progress Notes (Signed)
Spoke on the phone with Dr Sheryle Hailiamond. BS 431, pt has an insulin pump. BP 94/50 manually. Pt gets 17650ml/hr IVF.MD verbalized that he will come to check the insulin pump.

## 2014-10-03 NOTE — Progress Notes (Signed)
Inpatient Diabetes Program Recommendations  AACE/ADA: New Consensus Statement on Inpatient Glycemic Control (2013)  Target Ranges:  Prepandial:   less than 140 mg/dL      Peak postprandial:   less than 180 mg/dL (1-2 hours)      Critically ill patients:  140 - 180 mg/dL   Reason for Visit: insulin pump/elevated blood sugars  Diabetes history: Type 1 Outpatient Diabetes medications: Insulin via Mini Med Paradigm pump Current orders for Inpatient glycemic control: Novolog insulin moderate correction scale 0-15 units q4h   Met with patient at the bedside- she is not currently using her insulin pump; MD had her remove it last night.  [name] received 4 units of Novolog at 0509 for blood sugar of 442m/dl.  She has not received any basal insulin.  She does not have another insertion site to re-start the insulin pump here in the hospital therefore I have called Dr. SJoycie Peekoffice requesting assessment of this patient.   Pump setting; basal 12am-9:30am=0.275u/hour,  9:30am-midnight=0.3 u/hour  1:15 carb ratio (meaning she takes 1 unit for every 15 grams of carb that she eats) Correction factor 50 (meaning she needs to take 1 unit of insulin for every 50 points she above her target blood sugar of 1445mdl) Target blood sugar=1402ml   Dr. SolGabriel Carinas returned my call and has placed orders for inpatient basal/bolus insulin.  Patient will resume insulin pump when she goes home- Dr. SolGabriel Carinall write orders for discharge.  JulGentry FitzN, BA, MHA, CDE Diabetes Coordinator Inpatient Diabetes Program  [phone] Pager) [phone] Musselne Office) 10/03/2014 9:03 AM

## 2014-10-03 NOTE — Progress Notes (Signed)
Notified Dr. Sheryle Hailiamond By phone that per SSI order if BS is critical value need to notify MD. I need a specific order for units of insulin to give. BS 448, after rechecked 416. MD verbalized to give 15units SubQ novolog once.

## 2014-10-03 NOTE — Progress Notes (Signed)
[name] is a 69 y.o. female seen in follow up for type 1 diabetes. Last evening she started back on her insulin pump. She then had diarrhea and hypotension. Overnight, she had high sugar >400. She attributes this to placing the pump in area of scarring. She was then advised to remove the pump She refused the basal insulin which was ordered as Lantus 17 units. After two doses of NovoLog (total 9 units), sugars decreased to 105 by today mid morning. No NovoLog taken before lunch. A post-lunch sugar is now 193.    Medical history Past Medical History  Diagnosis Date  . Diabetes mellitus type 1     (prior saw Dr. Lodema HongSimpson endo, would like to establish locally)  . Generalized headaches     frequent  . Hypothyroidism   . Convulsions/seizures     unknown type, nml EEG  . History of chicken pox   . Anemia   . Vitamin B12 deficiency 03/2012    normal IF     Surgical history Past Surgical History  Procedure Laterality Date  . Cesarean section  1983     Medications . heparin  5,000 Units Subcutaneous 3 times per day  . insulin aspart  1-8 Units Subcutaneous TID WC  . insulin glargine  3 Units Subcutaneous STAT  . levothyroxine  137 mcg Oral QAC breakfast  . loratadine  10 mg Oral Daily     Social history History  Substance Use Topics  . Smoking status: Never Smoker   . Smokeless tobacco: Never Used  . Alcohol Use: 0.6 oz/week    1 Glasses of wine, 0 Cans of beer, 0 Shots of liquor per week     Comment: Socially (on weekends)     Family history Family History  Problem Relation Age of Onset  . Stroke Mother   . Hypertension Mother   . Hypertension Father   . Coronary artery disease Father 2058    MI  . Hypertension Sister   . Diabetes Cousin     type 1  . Cancer Maternal Aunt     brain  . Hyperlipidemia Brother      Review of systems CV: no chest pain or palpitations PULM: no cough or shortness of breath ABD: no abdominal pain or nausea or vomiting .  Appetite good. No further episodes of diarrhea.  Physical Exam BP 102/61 mmHg  Pulse 73  Temp(Src) 98.2 F (36.8 C) (Oral)  Resp 18  Ht 5\' 1"  (1.549 m)  Wt 61.19 kg (134 lb 14.4 oz)  BMI 25.50 kg/m2  SpO2 100%  GEN: well developed, well nourished female, in NAD. HEENT: No proptosis, EOMI, lid lag or stare. Oropharynx is clear.  NECK: supple, trachea midline.   RESPIRATORY: clear bilaterally, no wheeze, good inspiratory effort. CV: No carotid bruits, RRR. MUSCULOSKELETAL: normal gait and station, no clubbing, no tremor ABD: soft, NT/ND, no organomegaly EXT: no peripheral edema SKIN: no dermatopathy or rash or acanthosis nigricans.  LYMPH: no submandibular or supraclavicular LAD NEURO: PERRL, 2+ knee DTRs equal and symmetric.  PSYC: alert and oriented, good insight  Assessment Type 1 diabetes, uncontrolled  Plan 1. Restart insulin pump. I observed her restarting the pump. She then took a bolus of NovoLog to correct current high sugar. Insulin pump order set entered.  2. Continue low carb diet. 3. Anticipate DC home today per hospitalist final determination.

## 2014-10-03 NOTE — Progress Notes (Addendum)
Called by RN re: FSBS 431(0115 hrs).  Pt originally admitted for HYPOglycemia.  PMH sig for DM1 and pt usually wears an insulin pump that delivers basal rate + ss insulin.  Prior to admission pt was NOT wearing pump and had blood sugar of 22 x4 hrs despite dextrose.  Today, pt instructed to restart insulin pump by dietary, however unit policy is not to allow pt operation of insulin pumps.  The pump has been on since 1730 hrs today but is either malfunctioning or placed on lipohypertrophy or scar tissue.  Cannot verify if pump has delivered effective dose of insulin for last FSBS, therefore check serum ketones, then recheck blood sugar at 0300 hrs and administer ss inulin accordingly.   Also SBP 85-95.  The pt is mentating well and feels well, but her BP does not usually run low.  Mild concern that pt may have early sepsis (feeling sick prior to admission, now high blood sugar and hypotension).  BCx already obtained.  Start abx if condition worsens.  Blood pressure 94/50, pulse 86, temperature 98.1 F (36.7 C), temperature source Oral, resp. rate 16, height 5\' 1"  (1.549 m), weight 61.054 kg (134 lb 9.6 oz), SpO2 98 %.

## 2014-10-03 NOTE — Progress Notes (Signed)
Spoke on the phone with Dr. Sheryle Hailiamond. BS 465, after rechecked 435. MD verbalized that he will enter an order himself for insulin sliding scale.

## 2014-10-03 NOTE — Progress Notes (Signed)
Notified by phone Dr.Diamond. 15units of novolog not given. Pt verbalized that 15units of novolog is too much. Pt requested 4units of novolog. MD verbalized to give 4units of novolog.

## 2014-10-03 NOTE — Plan of Care (Addendum)
Problem: Discharge Progression Outcomes Goal: Discharge plan in place and appropriate Individualization: 1. Lives at home with husband. 2. Follows with Dr. Tedd SiasSolum every 3 months. 3. Reports decreased appetite, nausea/vomiting, weight loss with episode of hypoglycemia today. Insulin pump discontinued and sent home per pt. 4. High risk for fall with pt reporting multiple falls at home.  Outcome: Progressing Pt CBG's >400 tonight, symptomatic. MD notified, present in pt room insulin pump d/c, now on ISS. SBP was in the high 80's-90's, on IVF 14050ml/hr, MD notified no further action.

## 2014-11-21 DIAGNOSIS — E161 Other hypoglycemia: Secondary | ICD-10-CM | POA: Diagnosis not present

## 2014-11-21 DIAGNOSIS — R7309 Other abnormal glucose: Secondary | ICD-10-CM | POA: Diagnosis not present

## 2014-11-25 ENCOUNTER — Other Ambulatory Visit: Payer: Self-pay

## 2015-01-17 ENCOUNTER — Emergency Department: Payer: Medicare Other

## 2015-01-17 ENCOUNTER — Encounter: Payer: Self-pay | Admitting: *Deleted

## 2015-01-17 ENCOUNTER — Emergency Department
Admission: EM | Admit: 2015-01-17 | Discharge: 2015-01-17 | Disposition: A | Discharge status: Home / Self Care | Payer: Medicare Other | Source: Home / Self Care | Attending: Emergency Medicine | Admitting: Emergency Medicine

## 2015-01-17 DIAGNOSIS — D649 Anemia, unspecified: Secondary | ICD-10-CM | POA: Diagnosis not present

## 2015-01-17 DIAGNOSIS — E1065 Type 1 diabetes mellitus with hyperglycemia: Secondary | ICD-10-CM | POA: Diagnosis not present

## 2015-01-17 DIAGNOSIS — R079 Chest pain, unspecified: Secondary | ICD-10-CM | POA: Diagnosis not present

## 2015-01-17 DIAGNOSIS — Z833 Family history of diabetes mellitus: Secondary | ICD-10-CM | POA: Diagnosis not present

## 2015-01-17 DIAGNOSIS — G40909 Epilepsy, unspecified, not intractable, without status epilepticus: Secondary | ICD-10-CM | POA: Diagnosis not present

## 2015-01-17 DIAGNOSIS — S20229A Contusion of unspecified back wall of thorax, initial encounter: Secondary | ICD-10-CM

## 2015-01-17 DIAGNOSIS — E860 Dehydration: Secondary | ICD-10-CM | POA: Diagnosis not present

## 2015-01-17 DIAGNOSIS — R1110 Vomiting, unspecified: Secondary | ICD-10-CM | POA: Diagnosis not present

## 2015-01-17 DIAGNOSIS — Y92009 Unspecified place in unspecified non-institutional (private) residence as the place of occurrence of the external cause: Secondary | ICD-10-CM | POA: Insufficient documentation

## 2015-01-17 DIAGNOSIS — Y998 Other external cause status: Secondary | ICD-10-CM | POA: Insufficient documentation

## 2015-01-17 DIAGNOSIS — S0990XA Unspecified injury of head, initial encounter: Secondary | ICD-10-CM | POA: Diagnosis not present

## 2015-01-17 DIAGNOSIS — I959 Hypotension, unspecified: Secondary | ICD-10-CM | POA: Diagnosis not present

## 2015-01-17 DIAGNOSIS — Z8249 Family history of ischemic heart disease and other diseases of the circulatory system: Secondary | ICD-10-CM | POA: Diagnosis not present

## 2015-01-17 DIAGNOSIS — Z79891 Long term (current) use of opiate analgesic: Secondary | ICD-10-CM | POA: Diagnosis not present

## 2015-01-17 DIAGNOSIS — E1010 Type 1 diabetes mellitus with ketoacidosis without coma: Secondary | ICD-10-CM | POA: Diagnosis not present

## 2015-01-17 DIAGNOSIS — N179 Acute kidney failure, unspecified: Secondary | ICD-10-CM | POA: Diagnosis not present

## 2015-01-17 DIAGNOSIS — S299XXA Unspecified injury of thorax, initial encounter: Secondary | ICD-10-CM | POA: Diagnosis not present

## 2015-01-17 DIAGNOSIS — E119 Type 2 diabetes mellitus without complications: Secondary | ICD-10-CM | POA: Diagnosis not present

## 2015-01-17 DIAGNOSIS — Z794 Long term (current) use of insulin: Secondary | ICD-10-CM | POA: Insufficient documentation

## 2015-01-17 DIAGNOSIS — E039 Hypothyroidism, unspecified: Secondary | ICD-10-CM | POA: Diagnosis not present

## 2015-01-17 DIAGNOSIS — M546 Pain in thoracic spine: Secondary | ICD-10-CM | POA: Diagnosis not present

## 2015-01-17 DIAGNOSIS — Z79899 Other long term (current) drug therapy: Secondary | ICD-10-CM | POA: Insufficient documentation

## 2015-01-17 DIAGNOSIS — E10649 Type 1 diabetes mellitus with hypoglycemia without coma: Secondary | ICD-10-CM | POA: Diagnosis not present

## 2015-01-17 DIAGNOSIS — S199XXA Unspecified injury of neck, initial encounter: Secondary | ICD-10-CM | POA: Diagnosis not present

## 2015-01-17 DIAGNOSIS — R51 Headache: Secondary | ICD-10-CM | POA: Diagnosis not present

## 2015-01-17 DIAGNOSIS — E785 Hyperlipidemia, unspecified: Secondary | ICD-10-CM | POA: Diagnosis not present

## 2015-01-17 DIAGNOSIS — S2242XA Multiple fractures of ribs, left side, initial encounter for closed fracture: Secondary | ICD-10-CM | POA: Insufficient documentation

## 2015-01-17 DIAGNOSIS — Z823 Family history of stroke: Secondary | ICD-10-CM | POA: Diagnosis not present

## 2015-01-17 DIAGNOSIS — W108XXA Fall (on) (from) other stairs and steps, initial encounter: Secondary | ICD-10-CM | POA: Insufficient documentation

## 2015-01-17 DIAGNOSIS — Y9389 Activity, other specified: Secondary | ICD-10-CM | POA: Insufficient documentation

## 2015-01-17 DIAGNOSIS — E10319 Type 1 diabetes mellitus with unspecified diabetic retinopathy without macular edema: Secondary | ICD-10-CM | POA: Diagnosis not present

## 2015-01-17 DIAGNOSIS — E109 Type 1 diabetes mellitus without complications: Secondary | ICD-10-CM | POA: Insufficient documentation

## 2015-01-17 DIAGNOSIS — S2232XA Fracture of one rib, left side, initial encounter for closed fracture: Secondary | ICD-10-CM

## 2015-01-17 DIAGNOSIS — K5730 Diverticulosis of large intestine without perforation or abscess without bleeding: Secondary | ICD-10-CM | POA: Diagnosis not present

## 2015-01-17 DIAGNOSIS — Z809 Family history of malignant neoplasm, unspecified: Secondary | ICD-10-CM | POA: Diagnosis not present

## 2015-01-17 DIAGNOSIS — Z7982 Long term (current) use of aspirin: Secondary | ICD-10-CM | POA: Diagnosis not present

## 2015-01-17 DIAGNOSIS — R112 Nausea with vomiting, unspecified: Secondary | ICD-10-CM | POA: Diagnosis not present

## 2015-01-17 DIAGNOSIS — M545 Low back pain: Secondary | ICD-10-CM | POA: Diagnosis not present

## 2015-01-17 DIAGNOSIS — E875 Hyperkalemia: Secondary | ICD-10-CM | POA: Diagnosis not present

## 2015-01-17 LAB — BASIC METABOLIC PANEL
Anion gap: 9 (ref 5–15)
BUN: 25 mg/dL — AB (ref 6–20)
CO2: 21 mmol/L — ABNORMAL LOW (ref 22–32)
Calcium: 9.6 mg/dL (ref 8.9–10.3)
Chloride: 102 mmol/L (ref 101–111)
Creatinine, Ser: 1.49 mg/dL — ABNORMAL HIGH (ref 0.44–1.00)
GFR calc Af Amer: 41 mL/min — ABNORMAL LOW (ref >60)
GFR, EST NON AFRICAN AMERICAN: 35 mL/min — AB (ref >60)
GLUCOSE: 121 mg/dL — AB (ref 65–99)
Potassium: 5.1 mmol/L (ref 3.5–5.1)
SODIUM: 132 mmol/L — AB (ref 135–145)

## 2015-01-17 MED ORDER — OXYCODONE-ACETAMINOPHEN 5-325 MG PO TABS
1.0000 | ORAL_TABLET | Freq: Once | ORAL | Status: AC
  Administered 2015-01-17: 1 via ORAL
  Filled 2015-01-17: qty 1

## 2015-01-17 MED ORDER — OXYCODONE-ACETAMINOPHEN 5-325 MG PO TABS: 1.0000 | ORAL_TABLET | ORAL | Status: DC | PRN

## 2015-01-17 MED ORDER — SODIUM CHLORIDE 0.9 % IV BOLUS (SEPSIS)
1000.0000 mL | Freq: Once | INTRAVENOUS | Status: AC
  Administered 2015-01-17: 1000 mL via INTRAVENOUS

## 2015-01-17 MED ORDER — OXYCODONE-ACETAMINOPHEN 5-325 MG PO TABS: ORAL_TABLET | ORAL | Status: DC

## 2015-01-17 MED ORDER — OXYCODONE-ACETAMINOPHEN 5-325 MG PO TABS
1.0000 | ORAL_TABLET | Freq: Once | ORAL | Status: AC
  Administered 2015-01-17: 1 via ORAL

## 2015-01-17 MED ORDER — OXYCODONE-ACETAMINOPHEN 5-325 MG PO TABS
ORAL_TABLET | ORAL | Status: AC
  Filled 2015-01-17: qty 1

## 2015-01-17 NOTE — ED Notes (Signed)
AAOx3.  Skin warm and dry.  D/C home via wheelchair.  Respirations regular and non labored.  No SOB/ DOE.

## 2015-01-17 NOTE — ED Notes (Signed)
Pt arrives via EMS from home, pt fell down a few brick steps and onto cement, pt denies any LOC, nausea or blurry vision, pt arrives on spinal board and c-collar

## 2015-01-17 NOTE — Discharge Instructions (Signed)
KEEP YOUR APPOINTMENT WITH YOUR DOCTOR THIS COMING WEEK RETURN TO ER IF ANY SEVERE WORSENING USE PILLOW TO YOUR SIDE FOR DEEP BREATHS AND FOR COUGHING

## 2015-01-17 NOTE — ED Provider Notes (Signed)
[facility] Emergency Department Provider Note  ____________________________________________  Time seen: Approximately 2:23 PM  I have reviewed the triage vital signs and the nursing notes.   HISTORY  Chief Complaint Fall   HPI [name] is a 69 y.o. female is here via EMS after a fall at home. Patient states that she fell down a few brick steps and landed flat on her back on concrete. She denies any head injury or loss of consciousness. She arrived to the emergency room with a c-collar and spine board. She denies any paresthesias in her extremities. Denies any visual differences, nausea or vomiting, or headache. She states that her back and left side hurting. She denies any cuts or abrasions are in her fall. She states this was not a syncopal episode but missed one of the upper extremity abscess. Currently she rates her pain a 10 out of 10. She denies any previous problems with her back or neck.   Past Medical History  Diagnosis Date  . Diabetes mellitus type 1     (prior saw Dr. Moshe Cipro endo, would like to establish locally)  . Generalized headaches     frequent  . Hypothyroidism   . Convulsions/seizures     unknown type, nml EEG  . History of chicken pox   . Anemia   . Vitamin B12 deficiency 03/2012    normal IF    Patient Active Problem List   Diagnosis Date Noted  . Hypoglycemia 10/01/2014  . Diabetes 10/01/2014  . Viral URI with cough 07/10/2013  . Medicare annual wellness visit, initial 06/19/2012  . Vitamin B12 deficiency 06/11/2012  . Anemia 04/18/2012  . Fall from bed 03/14/2012  . EDEMA, HANDS 01/02/2010  . HYPOTHYROIDISM 03/22/2007  . DIABETES MELLITUS, TYPE I 03/22/2007  . HYPERLIPIDEMIA 03/22/2007    Past Surgical History  Procedure Laterality Date  . Cesarean section  1983    Current Outpatient Rx  Name  Route  Sig  Dispense  Refill  . Blood Glucose Monitoring Suppl (ONE TOUCH ULTRA 2) W/DEVICE KIT      Use as  instructed.         . Cyanocobalamin (VITAMIN B12 PO)   Oral   Take 800 mcg by mouth daily.         . Insulin Glulisine (APIDRA IJ)   Injection   Inject as directed as directed. In insulin pump and on sliding scale (1 unit for every 15 grams)         . insulin lispro (HUMALOG) 100 UNIT/ML injection   Subcutaneous   Inject into the skin. Patient is using this with her Insulin Pump.         Marland Kitchen levothyroxine (SYNTHROID, LEVOTHROID) 137 MCG tablet      TAKE ONE TABLET BY MOUTH ONCE DAILY BEFORE  BREAKFAST Patient not taking: Reported on 10/01/2014   30 tablet   3   . loratadine (CLARITIN) 10 MG tablet   Oral   Take 10 mg by mouth daily.         Marland Kitchen oxyCODONE-acetaminophen (PERCOCET) 5-325 MG per tablet      1-2 tablets q 4-6 hours prn pain   30 tablet   0     Allergies Review of patient's allergies indicates no known allergies.  Family History  Problem Relation Age of Onset  . Stroke Mother   . Hypertension Mother   . Hypertension Father   . Coronary artery disease Father 69    MI  .  Hypertension Sister   . Diabetes Cousin     type 1  . Cancer Maternal Aunt     brain  . Hyperlipidemia Brother     Social History Social History  Substance Use Topics  . Smoking status: Never Smoker   . Smokeless tobacco: Never Used  . Alcohol Use: 0.6 oz/week    1 Glasses of wine, 0 Cans of beer, 0 Shots of liquor per week     Comment: Socially (on weekends)    Review of Systems Constitutional: No fever/chills Eyes: No visual changes. ENT: No sore throat. Cardiovascular: Denies chest pain. Respiratory: Denies shortness of breath. Gastrointestinal: No abdominal pain.  No nausea, no vomiting.  No diarrhea.  No constipation. Genitourinary: Negative for dysuria. Musculoskeletal: positive for back pain. Skin: Negative for rash. Neurological: Negative for headaches, focal weakness or numbness.  10-point ROS otherwise  negative.  ____________________________________________   PHYSICAL EXAM:  VITAL SIGNS: ED Triage Vitals  Enc Vitals Group     BP 01/17/15 1418 101/69 mmHg     Pulse Rate 01/17/15 1423 78     Resp 01/17/15 1418 20     Temp 01/17/15 1418 97.5 F (36.4 C)     Temp Source 01/17/15 1418 Oral     SpO2 01/17/15 1423 100 %     Weight 01/17/15 1418 117 lb (53.071 kg)     Height 01/17/15 1418 '5\' 1"'  (1.549 m)     Head Cir --      Peak Flow --      Pain Score 01/17/15 1422 7     Pain Loc --      Pain Edu? --      Excl. in [place]? --     Constitutional: Alert and oriented. Well appearing and in no acute distress. Eyes: Conjunctivae are normal. PERRL. EOMI. Head: Atraumatic. Nose: No congestion/rhinnorhea. Mouth/Throat: no dental injury Neck: No stridor.  Cervical collar in place Cardiovascular: Normal rate, regular rhythm. Grossly normal heart sounds.  Good peripheral circulation. Respiratory: Normal respiratory effort.  No retractions. Lungs CTAB. Gastrointestinal: Soft and nontender. No distention. No abdominal bruits. No CVA tenderness.bowel sounds 4 quadrants within normal limits Musculoskeletal:examination of the back moderate tenderness on palpation of thoracic spine and paravertebral muscles bilaterally. Left lateral ribs tender no gross deformity was noted. No ecchymosis at present. Right ribs nontender. No lower extremity tenderness nor edema.moves upper extremities without any difficulty. Nontender on palpation  No joint effusions. Neurologic:  Normal speech and language. No gross focal neurologic deficits are appreciated. No gait instability. Skin:  Skin is warm, dry and intact. No rash noted. Psychiatric: Mood and affect are normal. Speech and behavior are normal.  ____________________________________________   LABS (all labs ordered are listed, but only abnormal results are displayed)  Labs Reviewed  BASIC METABOLIC PANEL - Abnormal; Notable for the following:    Sodium  132 (*)    CO2 21 (*)    Glucose, Bld 121 (*)    BUN 25 (*)    Creatinine, Ser 1.49 (*)    GFR calc non Af Amer 35 (*)    GFR calc Af Amer 41 (*)    All other components within normal limits  CBC WITH DIFFERENTIAL/PLATELET    RADIOLOGY  CT head per radiologist shows no acute intracranial findings or skull fracture. CT cervical spine shows mild degenerative changes but no acute spinal fracture. CT thoracic spine per radiologist shows rightward curvature and proximal thoracic spine. Preservation of the vertebral body height. Left  lateral ribs and chest per radiologist showed displaced fractures of the left fourth and fifth and sixth and seventh ribs. There is questionable fracture of the left second and third ribs. There is no pneumothorax.   ____________________________________________   PROCEDURES  Procedure(s) performed: None  Critical Care performed: No  ____________________________________________   INITIAL IMPRESSION / ASSESSMENT AND PLAN / ED COURSE  Pertinent labs & imaging results that were available during my care of the patient were reviewed by me and considered in my medical decision making (see chart for details).  ----------------------------------------- 5:20 PM on 01/17/2015 ----------------------------------------- While in x-ray patient received a phone call from her PCP on her private cell phone to let her know that blood work done in his office showed a critical value on her potassium. Patient states that this is happened once before and her potassium level was high.  Discussed lab work with patient and she is aware that her potassium here in the emergency room is within normal limits. She is also aware that her doctor is Dr. About her kidney function tests being elevated. She is unaware of the actual numbers but states this is not new. Patient prefers not to be admitted to the hospital and would like to try taking pain medication at home and follow-up with  her doctor. She will hold a pillow over her fractured ribs to take deep breaths and/or cough. She may return to the emergency room if any severe worsening of her condition.  ____________________________________________   FINAL CLINICAL IMPRESSION(S) / ED DIAGNOSES  Final diagnoses:  Fracture, ribs, left, closed, initial encounter  Contusion of back, unspecified laterality, initial encounter      Johnn Hai, PA-C 01/17/15 1815  Ahmed Prima, MD 01/17/15 2210

## 2015-01-19 ENCOUNTER — Encounter: Payer: Self-pay | Admitting: Internal Medicine

## 2015-01-19 ENCOUNTER — Emergency Department: Payer: Medicare Other

## 2015-01-19 ENCOUNTER — Inpatient Hospital Stay
Admission: EM | Admit: 2015-01-19 | Discharge: 2015-01-23 | DRG: 682 | Disposition: A | Discharge status: Skilled Nursing Facility | Payer: Medicare Other | Attending: Internal Medicine | Admitting: Internal Medicine

## 2015-01-19 DIAGNOSIS — Z79899 Other long term (current) drug therapy: Secondary | ICD-10-CM

## 2015-01-19 DIAGNOSIS — I959 Hypotension, unspecified: Secondary | ICD-10-CM | POA: Diagnosis present

## 2015-01-19 DIAGNOSIS — Z79891 Long term (current) use of opiate analgesic: Secondary | ICD-10-CM

## 2015-01-19 DIAGNOSIS — Z7982 Long term (current) use of aspirin: Secondary | ICD-10-CM

## 2015-01-19 DIAGNOSIS — R1110 Vomiting, unspecified: Secondary | ICD-10-CM | POA: Diagnosis present

## 2015-01-19 DIAGNOSIS — E875 Hyperkalemia: Secondary | ICD-10-CM | POA: Diagnosis present

## 2015-01-19 DIAGNOSIS — N179 Acute kidney failure, unspecified: Principal | ICD-10-CM

## 2015-01-19 DIAGNOSIS — Z794 Long term (current) use of insulin: Secondary | ICD-10-CM

## 2015-01-19 DIAGNOSIS — E10319 Type 1 diabetes mellitus with unspecified diabetic retinopathy without macular edema: Secondary | ICD-10-CM | POA: Diagnosis present

## 2015-01-19 DIAGNOSIS — Z8249 Family history of ischemic heart disease and other diseases of the circulatory system: Secondary | ICD-10-CM | POA: Diagnosis not present

## 2015-01-19 DIAGNOSIS — E10649 Type 1 diabetes mellitus with hypoglycemia without coma: Secondary | ICD-10-CM | POA: Diagnosis present

## 2015-01-19 DIAGNOSIS — Z823 Family history of stroke: Secondary | ICD-10-CM | POA: Diagnosis not present

## 2015-01-19 DIAGNOSIS — E860 Dehydration: Secondary | ICD-10-CM | POA: Diagnosis present

## 2015-01-19 DIAGNOSIS — Z809 Family history of malignant neoplasm, unspecified: Secondary | ICD-10-CM | POA: Diagnosis not present

## 2015-01-19 DIAGNOSIS — E1010 Type 1 diabetes mellitus with ketoacidosis without coma: Secondary | ICD-10-CM | POA: Diagnosis present

## 2015-01-19 DIAGNOSIS — E440 Moderate protein-calorie malnutrition: Secondary | ICD-10-CM | POA: Insufficient documentation

## 2015-01-19 DIAGNOSIS — Z833 Family history of diabetes mellitus: Secondary | ICD-10-CM | POA: Diagnosis not present

## 2015-01-19 DIAGNOSIS — E039 Hypothyroidism, unspecified: Secondary | ICD-10-CM | POA: Diagnosis present

## 2015-01-19 LAB — COMPREHENSIVE METABOLIC PANEL
ALBUMIN: 4 g/dL (ref 3.5–5.0)
ALK PHOS: 108 U/L (ref 38–126)
ALT: 19 U/L (ref 14–54)
AST: 35 U/L (ref 15–41)
Anion gap: 14 (ref 5–15)
BILIRUBIN TOTAL: 0.3 mg/dL (ref 0.3–1.2)
BUN: 43 mg/dL — AB (ref 6–20)
CO2: 16 mmol/L — ABNORMAL LOW (ref 22–32)
CREATININE: 2.86 mg/dL — AB (ref 0.44–1.00)
Calcium: 9.3 mg/dL (ref 8.9–10.3)
Chloride: 100 mmol/L — ABNORMAL LOW (ref 101–111)
GFR calc Af Amer: 19 mL/min — ABNORMAL LOW (ref >60)
GFR, EST NON AFRICAN AMERICAN: 16 mL/min — AB (ref >60)
GLUCOSE: 281 mg/dL — AB (ref 65–99)
POTASSIUM: 6.1 mmol/L — AB (ref 3.5–5.1)
Sodium: 130 mmol/L — ABNORMAL LOW (ref 135–145)
TOTAL PROTEIN: 7.4 g/dL (ref 6.5–8.1)

## 2015-01-19 LAB — CBC WITH DIFFERENTIAL/PLATELET
BASOS PCT: 0 %
Basophils Absolute: 0 10*3/uL (ref 0–0.1)
EOS PCT: 4 %
Eosinophils Absolute: 0.3 10*3/uL (ref 0–0.7)
HEMATOCRIT: 36.7 % (ref 35.0–47.0)
Hemoglobin: 12.1 g/dL (ref 12.0–16.0)
Lymphocytes Relative: 24 %
Lymphs Abs: 2.1 10*3/uL (ref 1.0–3.6)
MCH: 27.9 pg (ref 26.0–34.0)
MCHC: 33 g/dL (ref 32.0–36.0)
MCV: 84.4 fL (ref 80.0–100.0)
MONO ABS: 0.9 10*3/uL (ref 0.2–0.9)
Monocytes Relative: 11 %
NEUTROS ABS: 5.2 10*3/uL (ref 1.4–6.5)
Neutrophils Relative %: 61 %
PLATELETS: 238 10*3/uL (ref 150–440)
RBC: 4.35 MIL/uL (ref 3.80–5.20)
RDW: 14 % (ref 11.5–14.5)
WBC: 8.6 10*3/uL (ref 3.6–11.0)

## 2015-01-19 LAB — URINALYSIS COMPLETE WITH MICROSCOPIC (ARMC ONLY)
Bilirubin Urine: NEGATIVE
Glucose, UA: >500 mg/dL — AB
NITRITE: NEGATIVE
PROTEIN: NEGATIVE mg/dL
Specific Gravity, Urine: 1.016 (ref 1.005–1.030)
pH: 5 (ref 5.0–8.0)

## 2015-01-19 LAB — TROPONIN I
TROPONIN I: 0.04 ng/mL — AB (ref <0.031)
Troponin I: 0.05 ng/mL — ABNORMAL HIGH (ref <0.031)

## 2015-01-19 LAB — GLUCOSE, CAPILLARY
GLUCOSE-CAPILLARY: 221 mg/dL — AB (ref 65–99)
Glucose-Capillary: 176 mg/dL — ABNORMAL HIGH (ref 65–99)

## 2015-01-19 LAB — BETA-HYDROXYBUTYRIC ACID: BETA-HYDROXYBUTYRIC ACID: 2.35 mmol/L — AB (ref 0.05–0.27)

## 2015-01-19 MED ORDER — DOCUSATE SODIUM 100 MG PO CAPS
100.0000 mg | ORAL_CAPSULE | Freq: Two times a day (BID) | ORAL | Status: DC
  Administered 2015-01-19 – 2015-01-23 (×6): 100 mg via ORAL
  Filled 2015-01-19 (×8): qty 1

## 2015-01-19 MED ORDER — SODIUM CHLORIDE 0.9 % IV SOLN
INTRAVENOUS | Status: DC
  Administered 2015-01-19 – 2015-01-21 (×4): via INTRAVENOUS

## 2015-01-19 MED ORDER — SODIUM CHLORIDE 0.9 % IJ SOLN
3.0000 mL | Freq: Two times a day (BID) | INTRAMUSCULAR | Status: DC
  Administered 2015-01-21 – 2015-01-23 (×4): 3 mL via INTRAVENOUS

## 2015-01-19 MED ORDER — ASPIRIN EC 81 MG PO TBEC
81.0000 mg | DELAYED_RELEASE_TABLET | Freq: Every day | ORAL | Status: DC
  Administered 2015-01-19 – 2015-01-23 (×5): 81 mg via ORAL
  Filled 2015-01-19 (×5): qty 1

## 2015-01-19 MED ORDER — SODIUM CHLORIDE 0.9 % IV SOLN
Freq: Once | INTRAVENOUS | Status: AC
  Administered 2015-01-19: 13:00:00 via INTRAVENOUS

## 2015-01-19 MED ORDER — VITAMIN B-12 100 MCG PO TABS
100.0000 ug | ORAL_TABLET | Freq: Every day | ORAL | Status: DC
  Administered 2015-01-21 – 2015-01-23 (×3): 100 ug via ORAL
  Filled 2015-01-19 (×5): qty 1

## 2015-01-19 MED ORDER — ONDANSETRON HCL 4 MG/2ML IJ SOLN
4.0000 mg | Freq: Once | INTRAMUSCULAR | Status: AC
  Administered 2015-01-19: 4 mg via INTRAVENOUS
  Filled 2015-01-19: qty 2

## 2015-01-19 MED ORDER — HYDROMORPHONE HCL 1 MG/ML IJ SOLN
1.0000 mg | INTRAMUSCULAR | Status: DC | PRN
  Administered 2015-01-20 – 2015-01-21 (×2): 1 mg via INTRAVENOUS
  Filled 2015-01-19 (×2): qty 1

## 2015-01-19 MED ORDER — ACETAMINOPHEN 325 MG PO TABS
650.0000 mg | ORAL_TABLET | Freq: Four times a day (QID) | ORAL | Status: DC | PRN
  Filled 2015-01-19: qty 2

## 2015-01-19 MED ORDER — HEPARIN SODIUM (PORCINE) 5000 UNIT/ML IJ SOLN
5000.0000 [IU] | Freq: Three times a day (TID) | INTRAMUSCULAR | Status: DC
  Administered 2015-01-19 – 2015-01-21 (×7): 5000 [IU] via SUBCUTANEOUS
  Filled 2015-01-19 (×7): qty 1

## 2015-01-19 MED ORDER — DEXTROSE 5 % IV SOLN
1.0000 g | INTRAVENOUS | Status: DC
  Administered 2015-01-19 – 2015-01-20 (×2): 1 g via INTRAVENOUS
  Filled 2015-01-19 (×5): qty 10

## 2015-01-19 MED ORDER — ONDANSETRON HCL 4 MG/2ML IJ SOLN
4.0000 mg | Freq: Four times a day (QID) | INTRAMUSCULAR | Status: DC | PRN
  Administered 2015-01-19 – 2015-01-23 (×6): 4 mg via INTRAVENOUS
  Filled 2015-01-19 (×6): qty 2

## 2015-01-19 MED ORDER — INSULIN ASPART 100 UNIT/ML ~~LOC~~ SOLN
0.0000 [IU] | Freq: Three times a day (TID) | SUBCUTANEOUS | Status: DC
  Administered 2015-01-19: 3 [IU] via SUBCUTANEOUS
  Filled 2015-01-19: qty 3

## 2015-01-19 MED ORDER — ONDANSETRON HCL 4 MG PO TABS
4.0000 mg | ORAL_TABLET | Freq: Four times a day (QID) | ORAL | Status: DC | PRN
Start: 2015-01-19 — End: 2015-01-23

## 2015-01-19 MED ORDER — LORATADINE 10 MG PO TABS
10.0000 mg | ORAL_TABLET | Freq: Every day | ORAL | Status: DC
  Administered 2015-01-19 – 2015-01-23 (×5): 10 mg via ORAL
  Filled 2015-01-19 (×5): qty 1

## 2015-01-19 MED ORDER — BISACODYL 10 MG RE SUPP: 10.0000 mg | Freq: Every day | RECTAL | Status: DC | PRN

## 2015-01-19 MED ORDER — LEVOTHYROXINE SODIUM 50 MCG PO TABS
100.0000 ug | ORAL_TABLET | Freq: Every day | ORAL | Status: DC
  Administered 2015-01-20 – 2015-01-23 (×4): 100 ug via ORAL
  Filled 2015-01-19: qty 2
  Filled 2015-01-19: qty 1
  Filled 2015-01-19 (×3): qty 2

## 2015-01-19 MED ORDER — PROMETHAZINE HCL 25 MG/ML IJ SOLN
12.5000 mg | Freq: Once | INTRAMUSCULAR | Status: AC
  Administered 2015-01-19: 12.5 mg via INTRAVENOUS
  Filled 2015-01-19: qty 1

## 2015-01-19 MED ORDER — ACETAMINOPHEN 650 MG RE SUPP
650.0000 mg | Freq: Four times a day (QID) | RECTAL | Status: DC | PRN
  Administered 2015-01-20: 650 mg via RECTAL
  Filled 2015-01-19: qty 1

## 2015-01-19 NOTE — Progress Notes (Signed)
Notified md pt nauseated, heaving, orders taken

## 2015-01-19 NOTE — ED Notes (Signed)
Pt ate the ice from my biohazard bag for the lactic acid - pt states no longer nauseated. Verified with dr that pt could have ice chips and gave her a cup of ice.

## 2015-01-19 NOTE — ED Notes (Addendum)
Pt reports being seen Friday diagnosed with 4 fractured to left side. In today due continuous vomiting denies diarrhea. Denies any pain.blood pressure taken twice 70's systolic

## 2015-01-19 NOTE — H&P (Signed)
History and Physical    [name] ATF:[id number] DOB: [birth year] DOA: 01/19/2015  Referring physician: Dr. Cinda Quest PCP: Kirk Ruths., MD  Specialists: none  Chief Complaint: N/V with rib pain  HPI: [name] is a 69 y.o. female has a past medical history significant for Type I DM and hypothyroidism with recent rib fractures now with intractable N/V due to pain meds. In ER, pt noted to be dehydrated and hypotensive with acute renal failure. She is now admitted for further evaluation.  Review of Systems: The patient denies, fever, weight loss,, vision loss, decreased hearing, hoarseness, chest pain, syncope, dyspnea on exertion, peripheral edema, balance deficits, hemoptysis,  melena, hematochezia, severe indigestion/heartburn, hematuria, incontinence, genital sores, muscle weakness, suspicious skin lesions, transient blindness, difficulty walking, depression, unusual weight change, abnormal bleeding, enlarged lymph nodes, angioedema, and breast masses.   Past Medical History  Diagnosis Date  . Diabetes mellitus type 1     (prior saw Dr. Moshe Cipro endo, would like to establish locally)  . Generalized headaches     frequent  . Hypothyroidism   . Convulsions/seizures     unknown type, nml EEG  . History of chicken pox   . Anemia   . Vitamin B12 deficiency 03/2012    normal IF   Past Surgical History  Procedure Laterality Date  . Cesarean section  1983   Social History:  reports that she has never smoked. She has never used smokeless tobacco. She reports that she drinks about 0.6 oz of alcohol per week. She reports that she does not use illicit drugs.  No Known Allergies  Family History  Problem Relation Age of Onset  . Stroke Mother   . Hypertension Mother   . Hypertension Father   . Coronary artery disease Father 76    MI  . Hypertension Sister   . Diabetes Cousin     type 1  . Cancer Maternal Aunt     brain  . Hyperlipidemia Brother     Prior  to Admission medications   Medication Sig Start Date End Date Taking? Authorizing Provider  levothyroxine (SYNTHROID, LEVOTHROID) 100 MCG tablet Take 100 mcg by mouth daily before breakfast. 01/02/15  Yes Historical Provider, MD  Blood Glucose Monitoring Suppl (ONE TOUCH ULTRA 2) W/DEVICE KIT Use as instructed. 07/19/14   Historical Provider, MD  Cyanocobalamin (VITAMIN B12 PO) Take 800 mcg by mouth daily.    Historical Provider, MD  insulin lispro (HUMALOG) 100 UNIT/ML injection Inject into the skin. Patient is using this with her Insulin Pump.    Historical Provider, MD  levothyroxine (SYNTHROID, LEVOTHROID) 137 MCG tablet TAKE ONE TABLET BY MOUTH ONCE DAILY BEFORE  BREAKFAST Patient not taking: Reported on 10/01/2014 05/23/13   Ria Bush, MD  loratadine (CLARITIN) 10 MG tablet Take 10 mg by mouth daily.    Historical Provider, MD  oxyCODONE-acetaminophen (PERCOCET) 5-325 MG per tablet 1-2 tablets q 4-6 hours prn pain 01/17/15   Johnn Hai, PA-C   Physical Exam: Filed Vitals:   01/19/15 1201 01/19/15 1230  BP: 72/46 90/52  Pulse: 92 85  Temp: 97.5 F (36.4 C)   TempSrc: Oral   Resp: 20 9  Height: '5\' 1"'  (1.549 m)   Weight: 53.071 kg (117 lb)   SpO2: 95% 99%     General:  No apparent distress  Eyes: PERRL, EOMI, no scleral icterus  ENT: dry oropharynx  Neck: supple, no lymphadenopathy  Cardiovascular: regular rate without MRG; 2+ peripheral pulses, no  JVD, no peripheral edema  Respiratory: CTA biL, good air movement without wheezing, rhonchi or crackled  Abdomen: soft, non tender to palpation, positive bowel sounds, no guarding, no rebound  Skin: no rashes  Musculoskeletal: normal bulk and tone, no joint swelling  Psychiatric: normal mood and affect  Neurologic: CN 2-12 grossly intact, MS 5/5 in all 4  Labs on Admission:  Basic Metabolic Panel:  Recent Labs Lab 01/17/15 1700 01/19/15 1218  NA 132* 130*  K 5.1 6.1*  CL 102 100*  CO2 21* 16*  GLUCOSE  121* 281*  BUN 25* 43*  CREATININE 1.49* 2.86*  CALCIUM 9.6 9.3   Liver Function Tests:  Recent Labs Lab 01/19/15 1218  AST 35  ALT 19  ALKPHOS 108  BILITOT 0.3  PROT 7.4  ALBUMIN 4.0   No results for input(s): LIPASE, AMYLASE in the last 168 hours. No results for input(s): AMMONIA in the last 168 hours. CBC:  Recent Labs Lab 01/19/15 1218  WBC 8.6  NEUTROABS 5.2  HGB 12.1  HCT 36.7  MCV 84.4  PLT 238   Cardiac Enzymes:  Recent Labs Lab 01/19/15 1218  TROPONINI 0.05*    BNP (last 3 results) No results for input(s): BNP in the last 8760 hours.  ProBNP (last 3 results) No results for input(s): PROBNP in the last 8760 hours.  CBG: No results for input(s): GLUCAP in the last 168 hours.  Radiological Exams on Admission: Dg Ribs Unilateral W/chest Left  01/17/2015   CLINICAL DATA:  Status post fall today complaining of left posterior rib pain.  EXAM: LEFT RIBS AND CHEST - 3+ VIEW  COMPARISON:  May 15, 2013  FINDINGS: There are displaced fractures of the left fourth, fifth, sixth, seventh ribs. There questioned fractures of the left second and third ribs. There is no pneumothorax. The lungs are clear. The mediastinal contour and cardiac silhouette are normal.  IMPRESSION: Fractures of left ribs as described.   Electronically Signed   By: Abelardo Diesel M.D.   On: 01/17/2015 16:53   Dg Thoracic Spine 2 View  01/17/2015   CLINICAL DATA:  Patient status post fall while walking up the stairs. Left posterior rib and thoracic spine pain worse when breathing.  EXAM: THORACIC SPINE 2 VIEWS  COMPARISON:  Chest radiograph 05/15/2013  FINDINGS: Superior thoracic vertebral bodies are not well visualized. The mid and lower thoracic vertebral bodies are visualized and appear in normal height with normal intervertebral disc spaces. There is rightward curvature of the proximal thoracic spine demonstrated on the AP view. Multiple left-sided rib fractures, incompletely evaluated on  current exam.  IMPRESSION: Rightward curvature of the proximal thoracic spine. Recommend correlation for point tenderness at this location.  Otherwise relative preservation the vertebral body heights.  Incompletely visualized probable left rib fractures. See dedicated chest and rib report.   Electronically Signed   By: Lovey Newcomer M.D.   On: 01/17/2015 16:52   Ct Head Wo Contrast  01/19/2015   CLINICAL DATA:  Intractable vomiting over the last few days.  EXAM: CT HEAD WITHOUT CONTRAST  TECHNIQUE: Contiguous axial images were obtained from the base of the skull through the vertex without intravenous contrast.  COMPARISON:  Head CT 01/17/2015  FINDINGS: Brainstem is normal. Chronically known 1 cm lesion of the left cerebellum is again visible, consistent with a chronic cavernous angioma. Some adjacent brain low density is present as has been seen previously. No sign that there is any active process in this region. The cerebral  hemispheres are normal. No evidence of old or acute infarction, mass lesion, hemorrhage, hydrocephalus or extra-axial collection. Chronic benign left calvarial lucency likely related to a hemangioma. No acute calvarial finding. Sinuses, middle ears and mastoids are clear.  IMPRESSION: No acute intracranial finding. 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 Based on the unchanged appearance, this would be on likely to relate to the clinical presentation.   Electronically Signed   By: Nelson Chimes M.D.   On: 01/19/2015 13:31   Ct Head Wo Contrast  01/17/2015   CLINICAL DATA:  [place] downstairs today.  Hit head.  EXAM: CT HEAD WITHOUT CONTRAST  CT CERVICAL SPINE WITHOUT CONTRAST  TECHNIQUE: Multidetector CT imaging of the head and cervical spine was performed following the standard protocol without intravenous contrast. Multiplanar CT image reconstructions of the cervical spine were also generated.  COMPARISON:  Head CT 08/14/2009  FINDINGS: CT HEAD  FINDINGS  The ventricles are normal in size and configuration. No extra-axial fluid collections are identified. The gray-white differentiation is normal. No CT findings for acute intracranial process such as hemorrhage or infarction. No mass lesions. The brainstem and cerebellum are grossly normal.  The bony structures are intact. The paranasal sinuses and mastoid air cells are clear. The globes are intact.  CT CERVICAL SPINE FINDINGS  Degenerative cervical spondylosis with mild multilevel disc disease and facet disease. The cervical vertebral bodies are normally aligned. No acute fracture. The facets are normally aligned. Moderate C1-2 degenerative changes but no dens fracture. The spinal canal is quite generous. No spinal or significant foraminal stenosis. The lung apices are clear.  IMPRESSION: 1. No acute intracranial findings or skull fracture. 2. Mild degenerative cervical spondylosis but no acute cervical spine fracture.   Electronically Signed   By: Marijo Sanes M.D.   On: 01/17/2015 15:21   Ct Cervical Spine Wo Contrast  01/17/2015   CLINICAL DATA:  [place] downstairs today.  Hit head.  EXAM: CT HEAD WITHOUT CONTRAST  CT CERVICAL SPINE WITHOUT CONTRAST  TECHNIQUE: Multidetector CT imaging of the head and cervical spine was performed following the standard protocol without intravenous contrast. Multiplanar CT image reconstructions of the cervical spine were also generated.  COMPARISON:  Head CT 08/14/2009  FINDINGS: CT HEAD FINDINGS  The ventricles are normal in size and configuration. No extra-axial fluid collections are identified. The gray-white differentiation is normal. No CT findings for acute intracranial process such as hemorrhage or infarction. No mass lesions. The brainstem and cerebellum are grossly normal.  The bony structures are intact. The paranasal sinuses and mastoid air cells are clear. The globes are intact.  CT CERVICAL SPINE FINDINGS  Degenerative cervical spondylosis with mild  multilevel disc disease and facet disease. The cervical vertebral bodies are normally aligned. No acute fracture. The facets are normally aligned. Moderate C1-2 degenerative changes but no dens fracture. The spinal canal is quite generous. No spinal or significant foraminal stenosis. The lung apices are clear.  IMPRESSION: 1. No acute intracranial findings or skull fracture. 2. Mild degenerative cervical spondylosis but no acute cervical spine fracture.   Electronically Signed   By: Marijo Sanes M.D.   On: 01/17/2015 15:21    EKG: Independently reviewed.  Assessment/Plan Principal Problem:   ARF (acute renal failure) Active Problems:   Dehydration   Will admit to floor with IV fluids and image kidneys. Zofran prn. Follow sugars closely. Minimize narcotics. Repeat labs in AM.  Diet: clear liq Fluids: NS'@125'  DVT Prophylaxis: SQ Heparin  Code Status: FULL  Family Communication: yes  Disposition Plan: home  Time spent: 50 Nancy

## 2015-01-19 NOTE — ED Provider Notes (Signed)
[facility] Emergency Department Provider Note ____________________________________________  Time seen: Approximately 12:25 PM  I have reviewed the triage vital signs and the nursing notes.   HISTORY  Chief Complaint Emesis and Hypotension    HPI [name] is a 69 y.o. female patient was seen here on Friday after she fell. She had multiple rib fractures on the left side. Head CT was negative at that time. Patient and husband report that she had gone home and began having vomiting at that time. Patient's been vomiting almost nonstop ever since his been very weak. Patient denies any further headaches that she is only feeling lightheaded. Her sugar is still running high at 3:30. He still having some pain and left-sided chest. Nothing seems to make it better or worse. Addison was changed to oxycodone. That she still vomiting with that. Zofran has not helped at home.   Past Medical History  Diagnosis Date  . Diabetes mellitus type 1     (prior saw Dr. Lodema Hong endo, would like to establish locally)  . Generalized headaches     frequent  . Hypothyroidism   . Convulsions/seizures     unknown type, nml EEG  . History of chicken pox   . Anemia   . Vitamin B12 deficiency 03/2012    normal IF    Patient Active Problem List   Diagnosis Date Noted  . ARF (acute renal failure) 01/19/2015  . Dehydration 01/19/2015  . Hypoglycemia 10/01/2014  . Diabetes 10/01/2014  . Viral URI with cough 07/10/2013  . Medicare annual wellness visit, initial 06/19/2012  . Vitamin B12 deficiency 06/11/2012  . Anemia 04/18/2012  . Fall from bed 03/14/2012  . EDEMA, HANDS 01/02/2010  . HYPOTHYROIDISM 03/22/2007  . DIABETES MELLITUS, TYPE I 03/22/2007  . HYPERLIPIDEMIA 03/22/2007    Past Surgical History  Procedure Laterality Date  . Cesarean section  1983    No current outpatient prescriptions on file.  Allergies Review of patient's allergies indicates no known  allergies.  Family History  Problem Relation Age of Onset  . Stroke Mother   . Hypertension Mother   . Hypertension Father   . Coronary artery disease Father 17    MI  . Hypertension Sister   . Diabetes Cousin     type 1  . Cancer Maternal Aunt     brain  . Hyperlipidemia Brother     Social History Social History  Substance Use Topics  . Smoking status: Never Smoker   . Smokeless tobacco: Never Used  . Alcohol Use: 0.6 oz/week    1 Glasses of wine, 0 Cans of beer, 0 Shots of liquor per week     Comment: Socially (on weekends)    Review of Systems Constitutional: No fever/chills Eyes: No visual changes. ENT: No sore throat. Cardiovascular: Chest pain but associated with the rib fractures. Respiratory: Denies shortness of breath. Gastrointestinal: No abdominal pain.  No nausea, no vomiting.  No diarrhea.  No constipation. Genitourinary: Negative for dysuria. Musculoskeletal: Negative for back pain. Skin: Negative for rash. Neurological: Negative for headaches, focal weakness or numbness.  10-point ROS otherwise negative.  ____________________________________________   PHYSICAL EXAM:  VITAL SIGNS: ED Triage Vitals  Enc Vitals Group     BP 01/19/15 1201 72/46 mmHg     Pulse Rate 01/19/15 1201 92     Resp 01/19/15 1201 20     Temp 01/19/15 1201 97.5 F (36.4 C)     Temp Source 01/19/15 1201 Oral  SpO2 01/19/15 1201 95 %     Weight 01/19/15 1201 117 lb (53.071 kg)     Height 01/19/15 1201 5\' 1"  (1.549 m)     Head Cir --      Peak Flow --      Pain Score --      Pain Loc --      Pain Edu? --      Excl. in GC? --     Constitutional: Alert and oriented. Appears chronically ill. Eyes: Conjunctivae are normal. PERRL. EOMI. Head: Atraumatic. Nose: No congestion/rhinnorhea. Mouth/Throat: Mucous membranes are moist.  Oropharynx non-erythematous. Neck: No stridor.  Cardiovascular: Normal rate, regular rhythm. Grossly normal heart sounds.  Good peripheral  circulation. Respiratory: Normal respiratory effort.  No retractions. Lungs CTAB. Sinus chest is tender. Gastrointestinal: Soft and nontender. No distention. No abdominal bruits. No CVA tenderness. Musculoskeletal: No lower extremity tenderness nor edema.  No joint effusions. Neurologic:  Normal speech and language. No gross focal neurologic deficits are appreciated.  Skin:  Skin is warm, dry and intact. No rash noted. Psychiatric: Mood and affect are normal. Speech and behavior are normal.  ____________________________________________   LABS (all labs ordered are listed, but only abnormal results are displayed)  Labs Reviewed  COMPREHENSIVE METABOLIC PANEL - Abnormal; Notable for the following:    Sodium 130 (*)    Potassium 6.1 (*)    Chloride 100 (*)    CO2 16 (*)    Glucose, Bld 281 (*)    BUN 43 (*)    Creatinine, Ser 2.86 (*)    GFR calc non Af Amer 16 (*)    GFR calc Af Amer 19 (*)    All other components within normal limits  TROPONIN I - Abnormal; Notable for the following:    Troponin I 0.05 (*)    All other components within normal limits  URINALYSIS COMPLETEWITH MICROSCOPIC ([facility] ONLY) - Abnormal; Notable for the following:    Color, Urine YELLOW (*)    APPearance HAZY (*)    Glucose, UA >500 (*)    Ketones, ur 1+ (*)    Hgb urine dipstick 1+ (*)    Leukocytes, UA 2+ (*)    Bacteria, UA FEW (*)    Squamous Epithelial / LPF 0-5 (*)    All other components within normal limits  BETA-HYDROXYBUTYRIC ACID - Abnormal; Notable for the following:    Beta-Hydroxybutyric Acid 2.35 (*)    All other components within normal limits  TROPONIN I - Abnormal; Notable for the following:    Troponin I 0.04 (*)    All other components within normal limits  GLUCOSE, CAPILLARY - Abnormal; Notable for the following:    Glucose-Capillary 221 (*)    All other components within normal limits  GLUCOSE, CAPILLARY - Abnormal; Notable for the following:    Glucose-Capillary 176 (*)     All other components within normal limits  CBC WITH DIFFERENTIAL/PLATELET  TROPONIN I  TROPONIN I  CBC  COMPREHENSIVE METABOLIC PANEL   ____________________________________________  EKG  EKG read and interpreted by me shows normal sinus rhythm at a rate of 85 normal axis slightly low voltage QRS but otherwise no acute changes whatsoever ____________________________________________  RADIOLOGY   ____________________________________________   PROCEDURES    ____________________________________________   INITIAL IMPRESSION / ASSESSMENT AND PLAN / ED COURSE  Pertinent labs & imaging results that were available during my care of the patient were reviewed by me and considered in my medical decision making (see chart  for details).   ____________________________________________   FINAL CLINICAL IMPRESSION(S) / ED DIAGNOSES  Final diagnoses:  Vomiting      Arnaldo Natal, MD 01/19/15 2233

## 2015-01-19 NOTE — ED Notes (Signed)
Pt attempting to drink water - explained again that she is not to eat or drink while nauseated.

## 2015-01-20 LAB — COMPREHENSIVE METABOLIC PANEL
ALBUMIN: 3.2 g/dL — AB (ref 3.5–5.0)
ALT: 15 U/L (ref 14–54)
AST: 22 U/L (ref 15–41)
Alkaline Phosphatase: 87 U/L (ref 38–126)
Anion gap: 11 (ref 5–15)
BUN: 27 mg/dL — AB (ref 6–20)
CHLORIDE: 113 mmol/L — AB (ref 101–111)
CO2: 13 mmol/L — AB (ref 22–32)
CREATININE: 1.59 mg/dL — AB (ref 0.44–1.00)
Calcium: 8.5 mg/dL — ABNORMAL LOW (ref 8.9–10.3)
GFR calc Af Amer: 38 mL/min — ABNORMAL LOW (ref >60)
GFR, EST NON AFRICAN AMERICAN: 33 mL/min — AB (ref >60)
GLUCOSE: 262 mg/dL — AB (ref 65–99)
POTASSIUM: 6.7 mmol/L — AB (ref 3.5–5.1)
Sodium: 137 mmol/L (ref 135–145)
Total Bilirubin: 0.3 mg/dL (ref 0.3–1.2)
Total Protein: 6.2 g/dL — ABNORMAL LOW (ref 6.5–8.1)

## 2015-01-20 LAB — BASIC METABOLIC PANEL
Anion gap: 7 (ref 5–15)
Anion gap: 6 (ref 5–15)
Anion gap: 10 (ref 5–15)
BUN: 23 mg/dL — AB (ref 6–20)
BUN: 20 mg/dL (ref 6–20)
BUN: 16 mg/dL (ref 6–20)
CHLORIDE: 114 mmol/L — AB (ref 101–111)
CHLORIDE: 111 mmol/L (ref 101–111)
CHLORIDE: 109 mmol/L (ref 101–111)
CO2: 18 mmol/L — AB (ref 22–32)
CO2: 21 mmol/L — AB (ref 22–32)
CO2: 16 mmol/L — AB (ref 22–32)
CREATININE: 1.51 mg/dL — AB (ref 0.44–1.00)
CREATININE: 1.27 mg/dL — AB (ref 0.44–1.00)
CREATININE: 1.08 mg/dL — AB (ref 0.44–1.00)
Calcium: 9.5 mg/dL (ref 8.9–10.3)
Calcium: 9.3 mg/dL (ref 8.9–10.3)
Calcium: 8.8 mg/dL — ABNORMAL LOW (ref 8.9–10.3)
GFR calc Af Amer: 40 mL/min — ABNORMAL LOW (ref >60)
GFR calc Af Amer: 49 mL/min — ABNORMAL LOW (ref >60)
GFR calc non Af Amer: 35 mL/min — ABNORMAL LOW (ref >60)
GFR calc non Af Amer: 43 mL/min — ABNORMAL LOW (ref >60)
GFR calc non Af Amer: 52 mL/min — ABNORMAL LOW (ref >60)
Glucose, Bld: 168 mg/dL — ABNORMAL HIGH (ref 65–99)
Glucose, Bld: 149 mg/dL — ABNORMAL HIGH (ref 65–99)
Glucose, Bld: 242 mg/dL — ABNORMAL HIGH (ref 65–99)
Potassium: 4.4 mmol/L (ref 3.5–5.1)
Potassium: 4.5 mmol/L (ref 3.5–5.1)
Potassium: 5.4 mmol/L — ABNORMAL HIGH (ref 3.5–5.1)
Sodium: 139 mmol/L (ref 135–145)
Sodium: 138 mmol/L (ref 135–145)
Sodium: 135 mmol/L (ref 135–145)

## 2015-01-20 LAB — GLUCOSE, CAPILLARY
GLUCOSE-CAPILLARY: 178 mg/dL — AB (ref 65–99)
GLUCOSE-CAPILLARY: 117 mg/dL — AB (ref 65–99)
GLUCOSE-CAPILLARY: 154 mg/dL — AB (ref 65–99)
GLUCOSE-CAPILLARY: 255 mg/dL — AB (ref 65–99)
GLUCOSE-CAPILLARY: 307 mg/dL — AB (ref 65–99)
GLUCOSE-CAPILLARY: 297 mg/dL — AB (ref 65–99)
Glucose-Capillary: 134 mg/dL — ABNORMAL HIGH (ref 65–99)
Glucose-Capillary: 137 mg/dL — ABNORMAL HIGH (ref 65–99)
Glucose-Capillary: 139 mg/dL — ABNORMAL HIGH (ref 65–99)
Glucose-Capillary: 184 mg/dL — ABNORMAL HIGH (ref 65–99)
Glucose-Capillary: 187 mg/dL — ABNORMAL HIGH (ref 65–99)
Glucose-Capillary: 198 mg/dL — ABNORMAL HIGH (ref 65–99)
Glucose-Capillary: 435 mg/dL — ABNORMAL HIGH (ref 65–99)
Glucose-Capillary: 57 mg/dL — ABNORMAL LOW (ref 65–99)
Glucose-Capillary: 83 mg/dL (ref 65–99)

## 2015-01-20 LAB — CBC
HCT: 31.9 % — ABNORMAL LOW (ref 35.0–47.0)
Hemoglobin: 10.2 g/dL — ABNORMAL LOW (ref 12.0–16.0)
MCH: 28.1 pg (ref 26.0–34.0)
MCHC: 32.1 g/dL (ref 32.0–36.0)
MCV: 87.6 fL (ref 80.0–100.0)
PLATELETS: 185 10*3/uL (ref 150–440)
RBC: 3.64 MIL/uL — AB (ref 3.80–5.20)
RDW: 13.8 % (ref 11.5–14.5)
WBC: 8.4 10*3/uL (ref 3.6–11.0)

## 2015-01-20 LAB — TROPONIN I
TROPONIN I: 0.04 ng/mL — AB (ref <0.031)
Troponin I: 0.03 ng/mL (ref <0.031)

## 2015-01-20 LAB — MRSA PCR SCREENING: MRSA BY PCR: NEGATIVE

## 2015-01-20 MED ORDER — TRAMADOL HCL 50 MG PO TABS
50.0000 mg | ORAL_TABLET | Freq: Four times a day (QID) | ORAL | Status: DC | PRN
  Administered 2015-01-20 – 2015-01-23 (×2): 50 mg via ORAL
  Filled 2015-01-20 (×2): qty 1

## 2015-01-20 MED ORDER — DEXTROSE-NACL 5-0.45 % IV SOLN
INTRAVENOUS | Status: DC
Start: 2015-01-20 — End: 2015-01-20
  Administered 2015-01-20 – 2015-01-21 (×2): via INTRAVENOUS

## 2015-01-20 MED ORDER — SODIUM CHLORIDE 0.9 % IV SOLN: INTRAVENOUS | Status: DC

## 2015-01-20 MED ORDER — DEXTROSE 50 % IV SOLN
1.0000 | Freq: Once | INTRAVENOUS | Status: AC
  Administered 2015-01-20: 50 mL via INTRAVENOUS
  Filled 2015-01-20: qty 50

## 2015-01-20 MED ORDER — DEXTROSE 50 % IV SOLN
25.0000 mL | Freq: Once | INTRAVENOUS | Status: AC
  Administered 2015-01-20: 25 mL via INTRAVENOUS

## 2015-01-20 MED ORDER — SODIUM CHLORIDE 0.9 % IV SOLN: INTRAVENOUS | Status: AC

## 2015-01-20 MED ORDER — INSULIN ASPART 100 UNIT/ML ~~LOC~~ SOLN
10.0000 [IU] | Freq: Once | SUBCUTANEOUS | Status: AC
  Administered 2015-01-20: 10 [IU] via SUBCUTANEOUS
  Filled 2015-01-20: qty 10

## 2015-01-20 MED ORDER — DEXTROSE 50 % IV SOLN
INTRAVENOUS | Status: AC
  Administered 2015-01-20: 25 mL via INTRAVENOUS
  Filled 2015-01-20: qty 50

## 2015-01-20 MED ORDER — SODIUM CHLORIDE 0.9 % IV SOLN
2.0000 g | Freq: Once | INTRAVENOUS | Status: AC
  Administered 2015-01-20: 2 g via INTRAVENOUS
  Filled 2015-01-20: qty 20

## 2015-01-20 MED ORDER — SODIUM POLYSTYRENE SULFONATE 15 GM/60ML PO SUSP
30.0000 g | Freq: Once | ORAL | Status: AC
  Administered 2015-01-20: 30 g via ORAL
  Filled 2015-01-20: qty 120

## 2015-01-20 MED ORDER — INSULIN REGULAR HUMAN 100 UNIT/ML IJ SOLN
10.0000 [IU] | Freq: Once | INTRAMUSCULAR | Status: DC
  Filled 2015-01-20: qty 0.1

## 2015-01-20 MED ORDER — SODIUM CHLORIDE 0.9 % IV SOLN
INTRAVENOUS | Status: DC
  Administered 2015-01-20: 7.1 [IU]/h via INTRAVENOUS
  Administered 2015-01-20: 2 [IU]/h via INTRAVENOUS
  Administered 2015-01-21: 0.3 [IU]/h via INTRAVENOUS
  Administered 2015-01-21: 2.6 [IU]/h via INTRAVENOUS
  Administered 2015-01-21: 1.3 [IU]/h via INTRAVENOUS
  Administered 2015-01-21: 3.1 [IU]/h via INTRAVENOUS
  Administered 2015-01-21: 0.5 [IU]/h via INTRAVENOUS
  Administered 2015-01-21: 0.9 [IU]/h via INTRAVENOUS
  Filled 2015-01-20 (×2): qty 2.5

## 2015-01-20 NOTE — Progress Notes (Signed)
Inpatient Diabetes Program Recommendations  AACE/ADA: New Consensus Statement on Inpatient Glycemic Control (2013)  Target Ranges:  Prepandial:   less than 140 mg/dL      Peak postprandial:   less than 180 mg/dL (1-2 hours)      Critically ill patients:  140 - 180 mg/dL   Review of Glycemic control:  Results for [name], [name] (MRN [id number]) as of 01/20/2015 10:41  Ref. Range 01/19/2015 18:21 01/19/2015 21:09 01/20/2015 07:38 01/20/2015 10:14  Glucose-Capillary Latest Ref Range: 65-99 mg/dL 409 (H) 811 (H) 914 (H) 198 (H)    Diabetes history: Type 1 diabetes Outpatient Diabetes medications: Insulin pump-(patient states that she see's Dr. Eloy End reports that she removed insulin pump a couple of days before she fell due to low blood sugars Current orders for Inpatient glycemic control:  Novolog sensitive tid with meals  Note that CBG's elevated this morning.  Called and discussed with RN and MD.  Talked with patient and she states "I just can't stop throwing up".  She reports that she removed insulin pump a few days before she fell due to low blood sugars.  She was scheduled to see Dr. Tedd Sias today.  Patient does not have insulin pump with her at this time.  Note low CO2 this morning.  Discussed with MD.  Patient to be transferred to ICU.  Thanks, [name], RN, BC-ADM Inpatient Diabetes Coordinator Pager [phone] (8a-5p)

## 2015-01-20 NOTE — Progress Notes (Signed)
Called Dr. Allena Katz in regards to pt complaining of 8 out of 10 pain in back and side. BP currently soft with borderline MAPs of 63 and SBP of 93. Stated to give Ultram 50 Q6 hr PRN for moderate pain. Derald Macleod, RN

## 2015-01-20 NOTE — Consult Note (Signed)
ENDOCRINOLOGY CONSULTATION  REFERRING PHYSICIAN: Enedina Finner, MD CONSULTING PHYSICIAN:  A. Wendall Mola, MD.  CHIEF COMPLAINT:  Diabetic mellitus  HISTORY OF PRESENT ILLNESS:  69 y.o. female seen in consultation for diabetes mellitus, type 1. She was hospitalized yesterday after presenting with two days of N/V/hyperglycemia, s/p a fall 3 days ago causing several broken ribs. Initial blood sugar not elevated (131), however she had a non-gap acidosis with acute renal failure (creatinine 2.8), +ketones and +betahydroxybutyrate. She was initiated on IV fluids and has since been given IVF, however due to low-normal blood sugars, was then started on IV dextrose before IV insulin could be added.  She follows intermittently with me in the Endocrine clinic. She has been seen only 2 times there in the last year. She had a follow up visit scheduled for today. At her last visit in 08/2014, she had stopped her insulin pump just prior for a few weeks due to reports of repeated hypoglycemia, however when she came in to restart the pump she had hypoglycemia and then intractable N/V prompting her last hospitalization at this facility.  Again, she fell 2 days prior to admission. Reports it occurred at home. She had returned home from getting groceries when she fell back from front steps. She did go to the [facility] ER and imaging revealed multiple rib fractures.   She has primary hypothyroidism. Current TSH level is low-normal.    MEDICAL HISTORY: Diabetes mellitus type 1      A. diagnosis in 1990s      B. mild diabetic retinopathy, last eye exam 09/27/12      C. hypoglycemic seizure 04/2013      D. Recurrent DKA Hypothyroidism  SURGICAL HISTORY: None  CURRENT MEDICATIONS:  . aspirin EC  81 mg Oral Daily  . cefTRIAXone (ROCEPHIN)  IV  1 g Intravenous Q24H  . docusate sodium  100 mg Oral BID  . heparin  5,000 Units Subcutaneous 3 times per day  . levothyroxine  100 mcg Oral QAC breakfast  . loratadine  10 mg  Oral Daily  . sodium chloride  3 mL Intravenous Q12H  . cyanocobalamin  100 mcg Oral Daily     SOCIAL HISTORY:  Social History  Substance Use Topics  . Smoking status: Never Smoker   . Smokeless tobacco: Never Used  . Alcohol Use: 0.6 oz/week    1 Glasses of wine, 0 Cans of beer, 0 Shots of liquor per week     Comment: Socially (on weekends)     FAMILY HISTORY:   Family History  Problem Relation Age of Onset  . Stroke Mother   . Hypertension Mother   . Hypertension Father   . Coronary artery disease Father 50    MI  . Hypertension Sister   . Diabetes Cousin     type 1  . Cancer Maternal Aunt     brain  . Hyperlipidemia Brother      ALLERGIES:  No Known Allergies  REVIEW OF SYSTEMS:  GENERAL:  No weight loss.  No fever.  HEENT: Had blurred vision yesterday, now resolved. No sore throat.  NECK:  No neck pain or dysphagia.  CARDIAC:  No chest pain or palpitation.  PULMONARY:  No cough or shortness of breath.  ABDOMEN:  No abdominal pain.  No constipation. No appetite. EXTREMITIES:  No lower extremity swelling.  ENDOCRINE:  No heat or cold intolerance.  GENITOURINARY:  No dysuria or hematuria. SKIN:  No recent rash or skin changes.  PHYSICAL EXAMINATION:  BP 108/52 mmHg  Pulse 89  Temp(Src) 98.2 F (36.8 C) (Oral)  Resp 15  Ht  (1.549 m)  Wt 54.7 kg (120 lb 9.5 oz)  BMI 22.80 kg/m2  SpO2 99%  GENERAL:  Frail thin white  female in NAD. HEENT:  EOMI.  Oropharynx is clear.  NECK:  Supple.  No thyromegaly.  No neck tenderness.  CARDIAC:  Regular rate and rhythm without murmur.  PULMONARY:  Clear to auscultation bilaterally.  ABDOMEN:  Diffusely soft, nontender, nondistended.  EXTREMITIES:  No peripheral edema is present.    SKIN:  No rash or dermatopathy. NEUROLOGIC:  No dysarthria.  No tremor. PSYCHIATRIC:  Alert and oriented, calm, cooperative.   LABORATORY DATA:  01/17/15 -- Hgb A1c 10.7%  Results for orders placed or performed during the  hospital encounter of 01/19/15 (from the past 24 hour(s))  Glucose, capillary     Status: Abnormal   Collection Time: 01/19/15  9:09 PM  Result Value Ref Range   Glucose-Capillary 176 (H) 65 - 99 mg/dL  Troponin I     Status: None   Collection Time: 01/20/15 12:28 AM  Result Value Ref Range   Troponin I 0.03 <0.031 ng/mL  Troponin I     Status: Abnormal   Collection Time: 01/20/15  5:25 AM  Result Value Ref Range   Troponin I 0.04 (H) <0.031 ng/mL  CBC     Status: Abnormal   Collection Time: 01/20/15  5:25 AM  Result Value Ref Range   WBC 8.4 3.6 - 11.0 K/uL   RBC 3.64 (L) 3.80 - 5.20 MIL/uL   Hemoglobin 10.2 (L) 12.0 - 16.0 g/dL   HCT 16.1 (L) 09.6 - 04.5 %   MCV 87.6 80.0 - 100.0 fL   MCH 28.1 26.0 - 34.0 pg   MCHC 32.1 32.0 - 36.0 g/dL   RDW 40.9 81.1 - 91.4 %   Platelets 185 150 - 440 K/uL  Comprehensive metabolic panel     Status: Abnormal   Collection Time: 01/20/15  5:25 AM  Result Value Ref Range   Sodium 137 135 - 145 mmol/L   Potassium 6.7 (HH) 3.5 - 5.1 mmol/L   Chloride 113 (H) 101 - 111 mmol/L   CO2 13 (L) 22 - 32 mmol/L   Glucose, Bld 262 (H) 65 - 99 mg/dL   BUN 27 (H) 6 - 20 mg/dL   Creatinine, Ser 7.82 (H) 0.44 - 1.00 mg/dL   Calcium 8.5 (L) 8.9 - 10.3 mg/dL   Total Protein 6.2 (L) 6.5 - 8.1 g/dL   Albumin 3.2 (L) 3.5 - 5.0 g/dL   AST 22 15 - 41 U/L   ALT 15 14 - 54 U/L   Alkaline Phosphatase 87 38 - 126 U/L   Total Bilirubin 0.3 0.3 - 1.2 mg/dL   GFR calc non Af Amer 33 (L) >60 mL/min   GFR calc Af Amer 38 (L) >60 mL/min   Anion gap 11 5 - 15  Glucose, capillary     Status: Abnormal   Collection Time: 01/20/15  7:38 AM  Result Value Ref Range   Glucose-Capillary 435 (H) 65 - 99 mg/dL  Glucose, capillary     Status: Abnormal   Collection Time: 01/20/15 10:14 AM  Result Value Ref Range   Glucose-Capillary 198 (H) 65 - 99 mg/dL  Basic metabolic panel (stat then every 4 hours)     Status: Abnormal   Collection Time: 01/20/15 11:05 AM  Result Value  Ref Range   Sodium 139 135 - 145 mmol/L   Potassium 4.4 3.5 - 5.1 mmol/L   Chloride 114 (H) 101 - 111 mmol/L   CO2 18 (L) 22 - 32 mmol/L   Glucose, Bld 168 (H) 65 - 99 mg/dL   BUN 23 (H) 6 - 20 mg/dL   Creatinine, Ser 1.61 (H) 0.44 - 1.00 mg/dL   Calcium 9.5 8.9 - 09.6 mg/dL   GFR calc non Af Amer 35 (L) >60 mL/min   GFR calc Af Amer 40 (L) >60 mL/min   Anion gap 7 5 - 15  Glucose, capillary     Status: Abnormal   Collection Time: 01/20/15 11:20 AM  Result Value Ref Range   Glucose-Capillary 178 (H) 65 - 99 mg/dL  MRSA PCR Screening     Status: None   Collection Time: 01/20/15  1:15 PM  Result Value Ref Range   MRSA by PCR NEGATIVE NEGATIVE  Glucose, capillary     Status: None   Collection Time: 01/20/15  1:18 PM  Result Value Ref Range   Glucose-Capillary 83 65 - 99 mg/dL  Glucose, capillary     Status: Abnormal   Collection Time: 01/20/15  2:07 PM  Result Value Ref Range   Glucose-Capillary 57 (L) 65 - 99 mg/dL  Basic metabolic panel (stat then every 4 hours)     Status: Abnormal   Collection Time: 01/20/15  2:27 PM  Result Value Ref Range   Sodium 138 135 - 145 mmol/L   Potassium 4.5 3.5 - 5.1 mmol/L   Chloride 111 101 - 111 mmol/L   CO2 21 (L) 22 - 32 mmol/L   Glucose, Bld 149 (H) 65 - 99 mg/dL   BUN 20 6 - 20 mg/dL   Creatinine, Ser 0.45 (H) 0.44 - 1.00 mg/dL   Calcium 9.3 8.9 - 40.9 mg/dL   GFR calc non Af Amer 43 (L) >60 mL/min   GFR calc Af Amer 49 (L) >60 mL/min   Anion gap 6 5 - 15  Glucose, capillary     Status: Abnormal   Collection Time: 01/20/15  2:32 PM  Result Value Ref Range   Glucose-Capillary 117 (H) 65 - 99 mg/dL  Glucose, capillary     Status: Abnormal   Collection Time: 01/20/15  3:05 PM  Result Value Ref Range   Glucose-Capillary 139 (H) 65 - 99 mg/dL  Glucose, capillary     Status: Abnormal   Collection Time: 01/20/15  4:03 PM  Result Value Ref Range   Glucose-Capillary 134 (H) 65 - 99 mg/dL  Glucose, capillary     Status: Abnormal    Collection Time: 01/20/15  4:49 PM  Result Value Ref Range   Glucose-Capillary 137 (H) 65 - 99 mg/dL  Glucose, capillary     Status: Abnormal   Collection Time: 01/20/15  5:50 PM  Result Value Ref Range   Glucose-Capillary 154 (H) 65 - 99 mg/dL  Glucose, capillary     Status: Abnormal   Collection Time: 01/20/15  7:12 PM  Result Value Ref Range   Glucose-Capillary 187 (H) 65 - 99 mg/dL  Basic metabolic panel (stat then every 4 hours)     Status: Abnormal   Collection Time: 01/20/15  7:29 PM  Result Value Ref Range   Sodium 135 135 - 145 mmol/L   Potassium 5.4 (H) 3.5 - 5.1 mmol/L   Chloride 109 101 - 111 mmol/L   CO2 16 (L) 22 - 32 mmol/L  Glucose, Bld 242 (H) 65 - 99 mg/dL   BUN 16 6 - 20 mg/dL   Creatinine, Ser 4.54 (H) 0.44 - 1.00 mg/dL   Calcium 8.8 (L) 8.9 - 10.3 mg/dL   GFR calc non Af Amer 52 (L) >60 mL/min   GFR calc Af Amer >60 >60 mL/min   Anion gap 10 5 - 15  Glucose, capillary     Status: Abnormal   Collection Time: 01/20/15  8:31 PM  Result Value Ref Range   Glucose-Capillary 255 (H) 65 - 99 mg/dL    ASSESSMENT:  1. Uncontrolled type 1 diabetes 2. Acute renal failure due to dehydration, improved 3. Hypothyroidism   PLAN: 1. Sugars should increase with current plan of care. Once BG is >150, then IV insulin per DKA insulin drip protocol to be started. Once BG >250, then DC IV dextrose. 2. Continue IV insulin until we can transition her back to her insulin pump. 3. Asked Lovetta to have her family bring her insulin pump and her glucometer into the hospital. 4. Anticipate restarting pump once supplies are available, hopefully tomorrow. 5. Cautioned her against stopping the pump again. Asked her also to communicate with me concerns about hyper- or hypoglycemia.  6. Continue IVF. 7. Continue levothyroxine at current dose.   Will follow along with you.

## 2015-01-20 NOTE — Progress Notes (Signed)
Called ICU after bed received and spoke to the Charge Nurse and was told that a bed is not ready at this time. Charge Nurse to call when bed is ready.

## 2015-01-20 NOTE — Progress Notes (Signed)
Report called to [place] RN. Patient is alert and oriented, denies pain at this time.

## 2015-01-20 NOTE — Progress Notes (Signed)
Called Dr. Tedd Sias as per instructions from diabetes coordinator in regards to pt's current CBG of 83 and insulin gtt orders. Per Dr. Tedd Sias, will monitor CBG q 30 min and not start gtt until CBG > 150. Give current order of D5 1/2 NS @ 75 mL/hr. After phone conversation ended, rechecked CBG was 57. Started Hypoglycemia protocol and gave pt 25 mL of D50 IV. Derald Macleod, RN

## 2015-01-20 NOTE — Progress Notes (Signed)
Notified md of critical potassium and troponin level this am, discussed care and that pt had been nauseated throughout night, orders taken.

## 2015-01-20 NOTE — Progress Notes (Signed)
[facility] [place] [facility]   PATIENT NAME: [name]    MR#:  [id number]  DATE OF BIRTH:  Oct 12, 1945  SUBJECTIVE:  Came in with intractable nausea and vomiting. Not eating well at home. Labile sugars. Pt took off her pump few days ago  REVIEW OF SYSTEMS:   Review of Systems  Constitutional: Positive for malaise/fatigue. Negative for fever, chills and weight loss.  HENT: Negative for ear discharge, ear pain and nosebleeds.   Eyes: Negative for blurred vision, pain and discharge.  Respiratory: Negative for sputum production, shortness of breath, wheezing and stridor.   Cardiovascular: Negative for chest pain, palpitations, orthopnea and PND.  Gastrointestinal: Positive for nausea and vomiting. Negative for abdominal pain and diarrhea.  Genitourinary: Negative for urgency and frequency.  Musculoskeletal: Positive for back pain. Negative for joint pain.  Neurological: Positive for weakness. Negative for sensory change, speech change and focal weakness.  Psychiatric/Behavioral: Negative for depression and hallucinations. The patient is not nervous/anxious.   All other systems reviewed and are negative.  Tolerating Diet:npo Tolerating PT: eval not done  DRUG ALLERGIES:  No Known Allergies  VITALS:  Blood pressure 108/52, pulse 89, temperature 98.6 F (37 C), temperature source Oral, resp. rate 15, height  (1.549 m), weight 56.473 kg (124 lb 8 oz), SpO2 99 %.  PHYSICAL EXAMINATION:   Physical Exam  GENERAL:  69 y.o.-year-old patient lying in the bed with no acute distress. Weak, fraile,thin EYES: Pupils equal, round, reactive to light and accommodation. No scleral icterus. Extraocular muscles intact.  HEENT: Head atraumatic, normocephalic. Oropharynx and nasopharynx clear. Dry oral mucosa NECK:  Supple, no jugular venous distention. No thyroid enlargement, no tenderness.  LUNGS: Normal breath sounds bilaterally, no wheezing, rales,  rhonchi. No use of accessory muscles of respiration.  CARDIOVASCULAR: S1, S2 normal. No murmurs, rubs, or gallops.  ABDOMEN: Soft, nontender, nondistended. Bowel sounds present. No organomegaly or mass.  EXTREMITIES: No cyanosis, clubbing or edema b/l.    NEUROLOGIC: Cranial nerves II through XII are intact. No focal Motor or sensory deficits b/l.   PSYCHIATRIC: The patient is alert and oriented x 2.  SKIN: No obvious rash, lesion, or ulcer. Dry skin   LABORATORY PANEL:   CBC  Recent Labs Lab 01/20/15 0525  WBC 8.4  HGB 10.2*  HCT 31.9*  PLT 185    Chemistries   Recent Labs Lab 01/20/15 0525  01/20/15 1427  NA 137  < > 138  K 6.7*  < > 4.5  CL 113*  < > 111  CO2 13*  < > 21*  GLUCOSE 262*  < > 149*  BUN 27*  < > 20  CREATININE 1.59*  < > 1.27*  CALCIUM 8.5*  < > 9.3  AST 22  --   --   ALT 15  --   --   ALKPHOS 87  --   --   BILITOT 0.3  --   --   < > = values in this interval not displayed.  Cardiac Enzymes  Recent Labs Lab 01/20/15 0525  TROPONINI 0.04*    RADIOLOGY:  Dg Chest 2 View  01/19/2015   CLINICAL DATA:  Status post left fourth through sixth rib fractures 01/17/2015 after a fall. Vomiting and diarrhea today. Subsequent encounter.  EXAM: CHEST  2 VIEW  COMPARISON:  Plain films of the chest and left ribs 01/17/2015. PA and lateral chest 05/15/2013. CT chest 01/06/2010.  FINDINGS: Left fourth through seventh rib fractures  are noted. No new fracture is identified. The lungs are clear. No pneumothorax or pleural effusion is identified. Heart size is normal. Aortic atherosclerosis is noted.  IMPRESSION: Left fourth through seventh rib fractures. Negative for pneumothorax or acute cardiopulmonary disease.   Electronically Signed   By: Drusilla Kanner M.D.   On: 01/19/2015 14:14   Ct Head Wo Contrast  01/19/2015   CLINICAL DATA:  Intractable vomiting over the last few days.  EXAM: CT HEAD WITHOUT CONTRAST  TECHNIQUE: Contiguous axial images were obtained from  the base of the skull through the vertex without intravenous contrast.  COMPARISON:  Head CT 01/17/2015  FINDINGS: Brainstem is normal. Chronically known 1 cm lesion of the left cerebellum is again visible, consistent with a chronic cavernous angioma. Some adjacent brain low density is present as has been seen previously. No sign that there is any active process in this region. The cerebral hemispheres are normal. No evidence of old or acute infarction, mass lesion, hemorrhage, hydrocephalus or extra-axial collection. Chronic benign left calvarial lucency likely related to a hemangioma. No acute calvarial finding. Sinuses, middle ears and mastoids are clear.  IMPRESSION: No acute intracranial finding. 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 Based on the unchanged appearance, this would be on likely to relate to the clinical presentation.   Electronically Signed   By: Paulina Fusi M.D.   On: 01/19/2015 13:31   Ct Renal Stone Study  01/19/2015   CLINICAL DATA:  Severe vomiting and hypotension.  EXAM: CT ABDOMEN AND PELVIS WITHOUT CONTRAST  TECHNIQUE: Multidetector CT imaging of the abdomen and pelvis was performed following the standard protocol without IV contrast.  COMPARISON:  Abdomen CT 07/05/2005  FINDINGS: Lower chest: No acute findings.  Hepatobiliary: No mass visualized on this unenhanced exam. Gallbladder is unremarkable.  Pancreas: No mass or inflammatory process visualized on this unenhanced exam. Diffuse fatty replacement of pancreas noted.  Spleen:  Within normal limits in size.  Adrenal Glands:  No masses identified.  Kidneys/Urinary tract: No evidence of urolithiasis or hydronephrosis.  Stomach/Bowel/Peritoneum: Sigmoid diverticulosis is noted. No evidence of diverticulitis or other inflammatory process. Normal appendix visualized.  Vascular/Lymphatic: No pathologically enlarged lymph nodes identified. No abdominal aortic aneurysm or other significant  retroperitoneal abnormality demonstrated.  Reproductive:  No mass or other significant abnormality noted.  Other:  None.  Musculoskeletal:  No suspicious bone lesions identified.  IMPRESSION: Sigmoid diverticulosis. No radiographic evidence of diverticulitis or other acute findings.   Electronically Signed   By: Myles Rosenthal M.D.   On: 01/19/2015 14:14   ASSESSMENT AND PLAN:   69 y.o. female has a past medical history significant for Type I DM and hypothyroidism with recent rib fractures now with intractable N/V due to pain meds. In ER, pt noted to be dehydrated and hypotensive with acute renal failure.  1. Intractable nausea/vomiting in the setting of acute renal failure with acidosis(poitive BHBA) and DKA in DM-1 -pt has been off inuslin pump. She has been having labile sugars at home and issues with her pump site and funtionning of it(it was last chaeck by Dr Tedd Sias to ensure  Proper functioning in the clinic) -poor po intake -IV insulin gtt -IVF for hydraiton -dietitian for Diabetic diet -pt has labile DM and becomes hypoglycemic easily  2. Hyperkalemia Suspected due to ARF and dehydration -LK abck to normal  3. gen weaknes and falls at home PT to see pt  4. Relative hypotension Cont IVF  5.hypothyroidism Cont  synthroid  6.PT to eval pt once off Insulin gtt  Case discussed with Care Management/Social Worker. Management plans discussed with the patient, family and they are in agreement.  CODE STATUS: full  DVT Prophylaxis:lovenox  TOTAL criticalTIME TAKING CARE OF THIS PATIENT: 35 minutes.  >50% time spent on counselling and coordination of care t, RN and Dr Tedd Sias  POSSIBLE D/C IN 2-3 DAYS, DEPENDING ON CLINICAL CONDITION.   Parthenia Tellefsen M.D on 01/20/2015 at 6:12 PM  Between 7am to 6pm - Pager - [phone]  After 6pm go to [email or web address] - password [email or web address] [facility]  [facility]  Office  [phone]  CC: Primary care physician; Lauro Regulus., MD

## 2015-01-20 NOTE — Progress Notes (Signed)
Call received from RN regarding patient's CBG=83 mg/dL.  IV fluids ordered by MD are D51/2 at 75 cc/hr.  Rec. Calling Dr. Allena Katz or [facility] regarding further patient needs.  She states she will check blood sugars q 30 minutes at this time.    Thanks, [name], RN, BC-ADM Inpatient Diabetes Coordinator Pager [phone]

## 2015-01-20 NOTE — Progress Notes (Signed)
Initial Nutrition Assessment   INTERVENTION:   Coordination of Care: await diet progression Medical Food Supplement Therapy: will recommend once diet order advanced   NUTRITION DIAGNOSIS:   Inadequate oral intake related to inability to eat as evidenced by NPO status.  GOAL:   Patient will meet greater than or equal to 90% of their needs  MONITOR:    (Energy Intake, Glucose Profile, Digestive System)  REASON FOR ASSESSMENT:   Malnutrition Screening Tool    ASSESSMENT:   Pt admitted with nausea for the past 4 days, with recent h/o broken ribs. Pt with acute renal failure.  Past Medical History  Diagnosis Date  . Diabetes mellitus type 1     (prior saw Dr. Lodema Hong endo, would like to establish locally)  . Generalized headaches     frequent  . Hypothyroidism   . Convulsions/seizures     unknown type, nml EEG  . History of chicken pox   . Anemia   . Vitamin B12 deficiency 03/2012    normal IF    Diet Order:  Diet NPO time specified    Current Nutrition: Pt NPO today.   Food/Nutrition-Related History: Pt groggy on visit but reports 'I don't eat much anyways.' Per MST pt with decrease in appetite PTA.   Medications: D5 0.45%NS at 35mL/hr, colace  Electrolyte/Renal Profile and Glucose Profile:   Recent Labs Lab 01/20/15 0525 01/20/15 1105 01/20/15 1427  NA 137 139 138  K 6.7* 4.4 4.5  CL 113* 114* 111  CO2 13* 18* 21*  BUN 27* 23* 20  CREATININE 1.59* 1.51* 1.27*  CALCIUM 8.5* 9.5 9.3  GLUCOSE 262* 168* 149*   Glucose Profile: FSBS 139, 117, 57, 83  Protein Profile:  Recent Labs Lab 01/19/15 1218 01/20/15 0525  ALBUMIN 4.0 3.2*    Gastrointestinal Profile: Last BM: unknown   Nutrition-Focused Physical Exam Findings:  Unable to complete Nutrition-Focused physical exam at this time.    Weight Change: Pt weight fluctuating PTA, per ED pt weight 117lbs on 01/17/2015, 134lbs on 10/03/2014 (7% weight loss in 3.69months) On visit, pt unable to  tell writer weight trend.   Height:   Ht Readings from Last 1 Encounters:  01/19/15  (1.549 m)    Weight:   Wt Readings from Last 1 Encounters:  01/20/15 124 lb 8 oz (56.473 kg)   Wt Readings from Last 10 Encounters:  01/20/15 124 lb 8 oz (56.473 kg)  01/17/15 117 lb (53.071 kg)  10/03/14 134 lb 14.4 oz (61.19 kg)  07/10/13 132 lb 4 oz (59.988 kg)  12/18/12 135 lb 4 oz (61.349 kg)  06/19/12 122 lb 8 oz (55.566 kg)  04/18/12 128 lb 12 oz (58.401 kg)  03/14/12 127 lb 8 oz (57.834 kg)  01/02/10 138 lb (62.596 kg)  10/13/09 141 lb 6.1 oz (64.13 kg)     BMI:  Body mass index is 23.54 kg/(m^2).  Estimated Nutritional Needs:   Kcal:  [id number], BEE: 1036kcals, TEE; (IF 1.1-1.3)(AF 1.2)  Protein:  45-56g protein (0.8-1.0g/kg)   Fluid:  1410-1627mL of fluid (25-15mL/kg)  EDUCATION NEEDS:   No education needs identified at this time   HIGH Care Level  Leda Quail, RD, LDN Pager [phone]

## 2015-01-21 LAB — GLUCOSE, CAPILLARY
GLUCOSE-CAPILLARY: 107 mg/dL — AB (ref 65–99)
GLUCOSE-CAPILLARY: 129 mg/dL — AB (ref 65–99)
GLUCOSE-CAPILLARY: 147 mg/dL — AB (ref 65–99)
GLUCOSE-CAPILLARY: 149 mg/dL — AB (ref 65–99)
GLUCOSE-CAPILLARY: 149 mg/dL — AB (ref 65–99)
GLUCOSE-CAPILLARY: 149 mg/dL — AB (ref 65–99)
GLUCOSE-CAPILLARY: 153 mg/dL — AB (ref 65–99)
GLUCOSE-CAPILLARY: 177 mg/dL — AB (ref 65–99)
GLUCOSE-CAPILLARY: 191 mg/dL — AB (ref 65–99)
GLUCOSE-CAPILLARY: 216 mg/dL — AB (ref 65–99)
GLUCOSE-CAPILLARY: 224 mg/dL — AB (ref 65–99)
GLUCOSE-CAPILLARY: 225 mg/dL — AB (ref 65–99)
GLUCOSE-CAPILLARY: 71 mg/dL (ref 65–99)
GLUCOSE-CAPILLARY: 71 mg/dL (ref 65–99)
GLUCOSE-CAPILLARY: 86 mg/dL (ref 65–99)
GLUCOSE-CAPILLARY: 86 mg/dL (ref 65–99)
GLUCOSE-CAPILLARY: 92 mg/dL (ref 65–99)
Glucose-Capillary: 104 mg/dL — ABNORMAL HIGH (ref 65–99)
Glucose-Capillary: 109 mg/dL — ABNORMAL HIGH (ref 65–99)
Glucose-Capillary: 166 mg/dL — ABNORMAL HIGH (ref 65–99)
Glucose-Capillary: 170 mg/dL — ABNORMAL HIGH (ref 65–99)
Glucose-Capillary: 182 mg/dL — ABNORMAL HIGH (ref 65–99)
Glucose-Capillary: 190 mg/dL — ABNORMAL HIGH (ref 65–99)
Glucose-Capillary: 231 mg/dL — ABNORMAL HIGH (ref 65–99)
Glucose-Capillary: 55 mg/dL — ABNORMAL LOW (ref 65–99)
Glucose-Capillary: 97 mg/dL (ref 65–99)

## 2015-01-21 LAB — BASIC METABOLIC PANEL
Anion gap: 5 (ref 5–15)
Anion gap: 5 (ref 5–15)
Anion gap: 5 (ref 5–15)
Anion gap: 7 (ref 5–15)
BUN: 10 mg/dL (ref 6–20)
BUN: 11 mg/dL (ref 6–20)
BUN: 13 mg/dL (ref 6–20)
BUN: 8 mg/dL (ref 6–20)
CALCIUM: 8.6 mg/dL — AB (ref 8.9–10.3)
CHLORIDE: 111 mmol/L (ref 101–111)
CHLORIDE: 111 mmol/L (ref 101–111)
CHLORIDE: 113 mmol/L — AB (ref 101–111)
CHLORIDE: 112 mmol/L — AB (ref 101–111)
CO2: 20 mmol/L — ABNORMAL LOW (ref 22–32)
CO2: 20 mmol/L — AB (ref 22–32)
CO2: 18 mmol/L — AB (ref 22–32)
CO2: 17 mmol/L — AB (ref 22–32)
CREATININE: 1 mg/dL (ref 0.44–1.00)
CREATININE: 1 mg/dL (ref 0.44–1.00)
CREATININE: 0.71 mg/dL (ref 0.44–1.00)
Calcium: 8.8 mg/dL — ABNORMAL LOW (ref 8.9–10.3)
Calcium: 8.8 mg/dL — ABNORMAL LOW (ref 8.9–10.3)
Calcium: 9.1 mg/dL (ref 8.9–10.3)
Creatinine, Ser: 1.01 mg/dL — ABNORMAL HIGH (ref 0.44–1.00)
GFR calc Af Amer: >60 mL/min (ref >60)
GFR calc non Af Amer: 57 mL/min — ABNORMAL LOW (ref >60)
GFR calc non Af Amer: >60 mL/min (ref >60)
GFR, EST NON AFRICAN AMERICAN: 57 mL/min — AB (ref >60)
GFR, EST NON AFRICAN AMERICAN: 56 mL/min — AB (ref >60)
GLUCOSE: 202 mg/dL — AB (ref 65–99)
GLUCOSE: 86 mg/dL (ref 65–99)
Glucose, Bld: 108 mg/dL — ABNORMAL HIGH (ref 65–99)
Glucose, Bld: 214 mg/dL — ABNORMAL HIGH (ref 65–99)
POTASSIUM: 4.8 mmol/L (ref 3.5–5.1)
POTASSIUM: 5 mmol/L (ref 3.5–5.1)
POTASSIUM: 4.3 mmol/L (ref 3.5–5.1)
Potassium: 4.2 mmol/L (ref 3.5–5.1)
SODIUM: 136 mmol/L (ref 135–145)
Sodium: 136 mmol/L (ref 135–145)
Sodium: 136 mmol/L (ref 135–145)
Sodium: 136 mmol/L (ref 135–145)

## 2015-01-21 MED ORDER — CEPHALEXIN 500 MG PO CAPS
500.0000 mg | ORAL_CAPSULE | Freq: Two times a day (BID) | ORAL | Status: DC
  Administered 2015-01-21 (×2): 500 mg via ORAL
  Filled 2015-01-21 (×5): qty 1

## 2015-01-21 MED ORDER — DEXTROSE 50 % IV SOLN
INTRAVENOUS | Status: AC
  Administered 2015-01-21: 25 mL via INTRAVENOUS
  Filled 2015-01-21: qty 50

## 2015-01-21 MED ORDER — ENOXAPARIN SODIUM 40 MG/0.4ML ~~LOC~~ SOLN
40.0000 mg | SUBCUTANEOUS | Status: DC
  Administered 2015-01-21 – 2015-01-22 (×2): 40 mg via SUBCUTANEOUS
  Filled 2015-01-21 (×2): qty 0.4

## 2015-01-21 MED ORDER — SODIUM CHLORIDE 0.9 % IV SOLN
INTRAVENOUS | Status: DC
  Administered 2015-01-21: 1.3 [IU]/h via INTRAVENOUS
  Administered 2015-01-21: 1.2 [IU]/h via INTRAVENOUS
  Administered 2015-01-22: 0.6 [IU]/h via INTRAVENOUS
  Administered 2015-01-22 – 2015-01-23 (×4): 0.5 [IU]/h via INTRAVENOUS
  Filled 2015-01-21 (×2): qty 2.5

## 2015-01-21 MED ORDER — INSULIN ASPART 100 UNIT/ML ~~LOC~~ SOLN: 0.0000 [IU] | SUBCUTANEOUS | Status: DC

## 2015-01-21 MED ORDER — INSULIN REGULAR BOLUS VIA INFUSION
0.0000 [IU] | Freq: Three times a day (TID) | INTRAVENOUS | Status: DC
  Filled 2015-01-21: qty 10

## 2015-01-21 MED ORDER — SODIUM CHLORIDE 0.9 % IV SOLN: INTRAVENOUS | Status: DC

## 2015-01-21 MED ORDER — DEXTROSE-NACL 5-0.45 % IV SOLN: INTRAVENOUS | Status: DC

## 2015-01-21 MED ORDER — DEXTROSE 50 % IV SOLN
25.0000 mL | Freq: Once | INTRAVENOUS | Status: AC
  Administered 2015-01-21: 25 mL via INTRAVENOUS

## 2015-01-21 MED ORDER — DEXTROSE 50 % IV SOLN: 25.0000 mL | INTRAVENOUS | Status: DC | PRN

## 2015-01-21 NOTE — Progress Notes (Signed)
Inpatient Diabetes Program Recommendations  AACE/ADA: New Consensus Statement on Inpatient Glycemic Control (2013)  Target Ranges:  Prepandial:   less than 140 mg/dL      Peak postprandial:   less than 180 mg/dL (1-2 hours)      Critically ill patients:  140 - 180 mg/dL    See Dr. Pricilla Handler note from yesterday- she would like to transition [name] to her insulin pump requesting we leave her on the insulin drip until [name] has her supplies.  Spoke to RN regarding the request. Will follow.  Susette Racer, RN, BA, MHA, CDE Diabetes Coordinator Inpatient Diabetes Program  [phone] (Team Pager) [phone] [facility] Office) 01/21/2015 8:16 AM

## 2015-01-21 NOTE — Care Management Important Message (Signed)
Important Message  Patient Details  Name: [name] MRN: [id number] Date of Birth: 1946/05/21   Medicare Important Message Given:  Yes-second notification given    Verita Schneiders [place] 01/21/2015, 9:18 AM

## 2015-01-21 NOTE — Progress Notes (Signed)
[employer] Rep-Brian wants to hold off putting on insulin pump until tomorrow 8/24 or Friday, nursing will cont to monitor, Pieces of insulin pump are missing awaiting for daughter to bring

## 2015-01-21 NOTE — Progress Notes (Signed)
[name] is a 69 y.o. female seen in follow up for type 1 diabetes. She remains in the ICU on IV insulin. Most recent BG was 55 then 140. IV insulin stopped when BG was 55. She is not eating. Seems sleepy. However, has not received pain medications since early this AM. Husband at bedside.  Pt's dtr [name] brought in her insulin pump today however we don't have the cap for the battery, therefore cannot yet restart the pump.  Medical history  Diabetes mellitus type 1  A. diagnosis in 1990s  B. mild diabetic retinopathy, last eye exam 09/27/12  C. hypoglycemic seizure 04/2013  D. Recurrent DKA Hypothyroidism  Surgical history Past Surgical History  Procedure Laterality Date  . Cesarean section  1983    Medications . aspirin EC  81 mg Oral Daily  . cephALEXin  500 mg Oral Q12H  . docusate sodium  100 mg Oral BID  . heparin  5,000 Units Subcutaneous 3 times per day  . levothyroxine  100 mcg Oral QAC breakfast  . loratadine  10 mg Oral Daily  . sodium chloride  3 mL Intravenous Q12H  . cyanocobalamin  100 mcg Oral Daily     Social history Social History  Substance Use Topics  . Smoking status: Never Smoker   . Smokeless tobacco: Never Used  . Alcohol Use: 0.6 oz/week    1 Glasses of wine, 0 Cans of beer, 0 Shots of liquor per week     Comment: Socially (on weekends)     Family history Family History  Problem Relation Age of Onset  . Stroke Mother   . Hypertension Mother   . Hypertension Father   . Coronary artery disease Father 56    MI  . Hypertension Sister   . Diabetes Cousin     type 1  . Cancer Maternal Aunt     brain  . Hyperlipidemia Brother      Review of systems CV: no chest pain or palpitations PULM: no cough or shortness of breath ABD: no abdominal pain or nausea or vomiting   Physical Exam BP 113/58 mmHg  Pulse 88  Temp(Src) 98.7 F (37.1 C) (Oral)  Resp 16  Ht  (1.549 m)  Wt 64.5 kg (142 lb 3.2 oz)  BMI 26.88  kg/m2  SpO2 97%   GEN: thin WF, in NAD. HEENT: No proptosis, EOMI, lid lag or stare. Oropharynx is clear.  NECK: supple, trachea midline.   RESPIRATORY: clear bilaterally, no wheeze, good inspiratory effort. CV: No carotid bruits, RRR. MUSCULOSKELETAL: normal gait and station, no clubbing, no tremor ABD: soft, NT/ND  EXT: no peripheral edema SKIN: no dermatopathy or rash or acanthosis nigricans.  LYMPH: no submandibular or supraclavicular LAD PSYC: drowsy, arousable, appropriate.  Assessment Type 1 diabetes, uncontrolled  Plan Will continue IV insulin, however plan to modify the glucose stabilizer so that the rate of insulin per hour is decreased.   Anticipate re-starting her insulin pump, perhaps tomorrow, once family brings in the cap for the battery for the pump.  She was encouraged to increase her PO nutrition.  Will follow along with you.

## 2015-01-21 NOTE — Progress Notes (Signed)
Pt remains on insulin drip. VSS. Pt is alert and oriented but lethargic. UOP  .SR on CM. Getting o2 via 2L [place].  sao2 96%. Lung sound clear. Resting  Comfortably in bed.

## 2015-01-21 NOTE — Progress Notes (Signed)
[facility] - [place] at [facility]   PATIENT NAME: [name]    MR#:  [id number]  DATE OF BIRTH:  1945-07-09  SUBJECTIVE:  Came in with intractable nausea and vomiting. Not eating well at home. Labile sugars. Pt took off her pump few days ago Seems ok but not her usual self. Wants to try to eat  REVIEW OF SYSTEMS:   Review of Systems  Constitutional: Positive for malaise/fatigue. Negative for fever, chills and weight loss.  HENT: Negative for ear discharge, ear pain and nosebleeds.   Eyes: Negative for blurred vision, pain and discharge.  Respiratory: Negative for sputum production, shortness of breath, wheezing and stridor.   Cardiovascular: Negative for chest pain, palpitations, orthopnea and PND.  Gastrointestinal: Positive for nausea. Negative for vomiting, abdominal pain and diarrhea.  Genitourinary: Negative for urgency and frequency.  Musculoskeletal: Positive for back pain. Negative for joint pain.  Neurological: Positive for weakness. Negative for sensory change, speech change and focal weakness.  Psychiatric/Behavioral: Negative for depression and hallucinations. The patient is not nervous/anxious.   All other systems reviewed and are negative.  Tolerating Diet:npo Tolerating PT: eval not done  DRUG ALLERGIES:  No Known Allergies  VITALS:  Blood pressure 113/58, pulse 88, temperature 98.7 F (37.1 C), temperature source Oral, resp. rate 16, height  (1.549 m), weight 64.5 kg (142 lb 3.2 oz), SpO2 97 %.  PHYSICAL EXAMINATION:   Physical Exam  GENERAL:  69 y.o.-year-old patient lying in the bed with no acute distress. Weak, fraile,thin EYES: Pupils equal, round, reactive to light and accommodation. No scleral icterus. Extraocular muscles intact.  HEENT: Head atraumatic, normocephalic. Oropharynx and nasopharynx clear. Dry oral mucosa NECK:  Supple, no jugular venous distention. No thyroid enlargement, no tenderness.  LUNGS: Normal  breath sounds bilaterally, no wheezing, rales, rhonchi. No use of accessory muscles of respiration.  CARDIOVASCULAR: S1, S2 normal. No murmurs, rubs, or gallops.  ABDOMEN: Soft, nontender, nondistended. Bowel sounds present. No organomegaly or mass.  EXTREMITIES: No cyanosis, clubbing or edema b/l.    NEUROLOGIC: Cranial nerves II through XII are intact. No focal Motor or sensory deficits b/l.   PSYCHIATRIC: The patient is alert and oriented x 2.  SKIN: No obvious rash, lesion, or ulcer. Dry skin   LABORATORY PANEL:   CBC  Recent Labs Lab 01/20/15 0525  WBC 8.4  HGB 10.2*  HCT 31.9*  PLT 185    Chemistries   Recent Labs Lab 01/20/15 0525  01/21/15 0803  NA 137  < > 136  K 6.7*  < > 5.0  CL 113*  < > 111  CO2 13*  < > 20*  GLUCOSE 262*  < > 202*  BUN 27*  < > 10  CREATININE 1.59*  < > 1.01*  CALCIUM 8.5*  < > 8.8*  AST 22  --   --   ALT 15  --   --   ALKPHOS 87  --   --   BILITOT 0.3  --   --   < > = values in this interval not displayed.  Cardiac Enzymes  Recent Labs Lab 01/20/15 0525  TROPONINI 0.04*    RADIOLOGY:  No results found. ASSESSMENT AND PLAN:   69 y.o. female has a past medical history significant for Type I DM and hypothyroidism with recent rib fractures now with intractable N/V due to pain meds. In ER, pt noted to be dehydrated and hypotensive with acute renal failure.  1. Intractable nausea/vomiting  in the setting of acute renal failure with acidosis(poitive BHBA) and DKA in DM-1 -pt has been off inuslin pump. She has been having labile sugars at home and issues with her pump site and funtionning of it(it was last chaeck by Dr Tedd Sias to ensure  Proper functioning in the clinic) -poor po intake -IV insulin gtt off due to hypoglycemia -IVF for hydraiton -dietitian for Diabetic diet -pt has labile DM and becomes hypoglycemic easily. -appreciate Dr Pricilla Handler input appreciate  2. Hyperkalemia Suspected due to ARF and dehydration -K back to  normal  3. gen weaknes and falls at home PT to see pt  4. Relative hypotension Cont IVF  5.hypothyroidism Cont synthroid  6.PT to eval pt once off Insulin gtt  Case discussed with Care Management/Social Worker. Management plans discussed with the patient, family and they are in agreement.  CODE STATUS: full  DVT Prophylaxis:lovenox  TOTAL criticalTIME TAKING CARE OF THIS PATIENT: 35 minutes.  >50% time spent on counselling and coordination of care t, RN and Dr Tedd Sias  POSSIBLE D/C IN 2-3 DAYS, DEPENDING ON CLINICAL CONDITION.   Quintasia Theroux M.D on 01/21/2015 at 1:27 PM  Between 7am to 6pm - Pager - [phone]  After 6pm go to [email or web address] - password [email or web address] [facility]  [place] [facility]  Office  [phone]  CC: Primary care physician; Lauro Regulus., MD

## 2015-01-21 NOTE — Progress Notes (Signed)
Pt Gluomander multiplier changed to 0.005 per Nursing order from DM Coordinator

## 2015-01-21 NOTE — Progress Notes (Signed)
[facility] - [place] at [facility]   PATIENT NAME: [name]    MR#:  [id number]  DATE OF BIRTH:  30-Mar-1946  SUBJECTIVE:  Came in with intractable nausea and vomiting. Not eating well at home. Labile sugars. Pt took off her pump few days ago Now better today. Feels hungry. suganrs better REVIEW OF SYSTEMS:   Review of Systems  Constitutional: Positive for malaise/fatigue. Negative for fever, chills and weight loss.  HENT: Negative for ear discharge, ear pain and nosebleeds.   Eyes: Negative for blurred vision, pain and discharge.  Respiratory: Negative for sputum production, shortness of breath, wheezing and stridor.   Cardiovascular: Negative for chest pain, palpitations, orthopnea and PND.  Gastrointestinal: Positive for nausea. Negative for vomiting, abdominal pain and diarrhea.  Genitourinary: Negative for urgency and frequency.  Musculoskeletal: Positive for back pain. Negative for joint pain.  Neurological: Positive for weakness. Negative for sensory change, speech change and focal weakness.  Psychiatric/Behavioral: Negative for depression and hallucinations. The patient is not nervous/anxious.   All other systems reviewed and are negative.  Tolerating Diet:npo Tolerating PT: eval not done  DRUG ALLERGIES:  No Known Allergies  VITALS:  Blood pressure 96/61, pulse 91, temperature 100.4 F (38 C), temperature source Oral, resp. rate 18, height 5\' 1"  (1.549 m), weight 64.5 kg (142 lb 3.2 oz), SpO2 98 %.  PHYSICAL EXAMINATION:   Physical Exam  GENERAL:  69 y.o.-year-old patient lying in the bed with no acute distress. Weak, fraile,thin EYES: Pupils equal, round, reactive to light and accommodation. No scleral icterus. Extraocular muscles intact.  HEENT: Head atraumatic, normocephalic. Oropharynx and nasopharynx clear. Dry oral mucosa NECK:  Supple, no jugular venous distention. No thyroid enlargement, no tenderness.  LUNGS: Normal breath  sounds bilaterally, no wheezing, rales, rhonchi. No use of accessory muscles of respiration.  CARDIOVASCULAR: S1, S2 normal. No murmurs, rubs, or gallops.  ABDOMEN: Soft, nontender, nondistended. Bowel sounds present. No organomegaly or mass.  EXTREMITIES: No cyanosis, clubbing or edema b/l.    NEUROLOGIC: Cranial nerves II through XII are intact. No focal Motor or sensory deficits b/l.   PSYCHIATRIC: The patient is alert and oriented x 2.  SKIN: No obvious rash, lesion, or ulcer. Dry skin   LABORATORY PANEL:   CBC  Recent Labs Lab 01/20/15 0525  WBC 8.4  HGB 10.2*  HCT 31.9*  PLT 185    Chemistries   Recent Labs Lab 01/20/15 0525  01/21/15 0803  NA 137  < > 136  K 6.7*  < > 5.0  CL 113*  < > 111  CO2 13*  < > 20*  GLUCOSE 262*  < > 202*  BUN 27*  < > 10  CREATININE 1.59*  < > 1.01*  CALCIUM 8.5*  < > 8.8*  AST 22  --   --   ALT 15  --   --   ALKPHOS 87  --   --   BILITOT 0.3  --   --   < > = values in this interval not displayed.  Cardiac Enzymes  Recent Labs Lab 01/20/15 0525  TROPONINI 0.04*    RADIOLOGY:  Dg Chest 2 View  01/19/2015   CLINICAL DATA:  Status post left fourth through sixth rib fractures 01/17/2015 after a fall. Vomiting and diarrhea today. Subsequent encounter.  EXAM: CHEST  2 VIEW  COMPARISON:  Plain films of the chest and left ribs 01/17/2015. PA and lateral chest 05/15/2013. CT chest 01/06/2010.  FINDINGS: Left  fourth through seventh rib fractures are noted. No new fracture is identified. The lungs are clear. No pneumothorax or pleural effusion is identified. Heart size is normal. Aortic atherosclerosis is noted.  IMPRESSION: Left fourth through seventh rib fractures. Negative for pneumothorax or acute cardiopulmonary disease.   Electronically Signed   By: Drusilla Kanner M.D.   On: 01/19/2015 14:14   Ct Head Wo Contrast  01/19/2015   CLINICAL DATA:  Intractable vomiting over the last few days.  EXAM: CT HEAD WITHOUT CONTRAST  TECHNIQUE:  Contiguous axial images were obtained from the base of the skull through the vertex without intravenous contrast.  COMPARISON:  Head CT 01/17/2015  FINDINGS: Brainstem is normal. Chronically known 1 cm lesion of the left cerebellum is again visible, consistent with a chronic cavernous angioma. Some adjacent brain low density is present as has been seen previously. No sign that there is any active process in this region. The cerebral hemispheres are normal. No evidence of old or acute infarction, mass lesion, hemorrhage, hydrocephalus or extra-axial collection. Chronic benign left calvarial lucency likely related to a hemangioma. No acute calvarial finding. Sinuses, middle ears and mastoids are clear.  IMPRESSION: No acute intracranial finding. 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 Based on the unchanged appearance, this would be on likely to relate to the clinical presentation.   Electronically Signed   By: Paulina Fusi M.D.   On: 01/19/2015 13:31   Ct Renal Stone Study  01/19/2015   CLINICAL DATA:  Severe vomiting and hypotension.  EXAM: CT ABDOMEN AND PELVIS WITHOUT CONTRAST  TECHNIQUE: Multidetector CT imaging of the abdomen and pelvis was performed following the standard protocol without IV contrast.  COMPARISON:  Abdomen CT 07/05/2005  FINDINGS: Lower chest: No acute findings.  Hepatobiliary: No mass visualized on this unenhanced exam. Gallbladder is unremarkable.  Pancreas: No mass or inflammatory process visualized on this unenhanced exam. Diffuse fatty replacement of pancreas noted.  Spleen:  Within normal limits in size.  Adrenal Glands:  No masses identified.  Kidneys/Urinary tract: No evidence of urolithiasis or hydronephrosis.  Stomach/Bowel/Peritoneum: Sigmoid diverticulosis is noted. No evidence of diverticulitis or other inflammatory process. Normal appendix visualized.  Vascular/Lymphatic: No pathologically enlarged lymph nodes identified. No abdominal  aortic aneurysm or other significant retroperitoneal abnormality demonstrated.  Reproductive:  No mass or other significant abnormality noted.  Other:  None.  Musculoskeletal:  No suspicious bone lesions identified.  IMPRESSION: Sigmoid diverticulosis. No radiographic evidence of diverticulitis or other acute findings.   Electronically Signed   By: Myles Rosenthal M.D.   On: 01/19/2015 14:14   ASSESSMENT AND PLAN:   69 y.o. female has a past medical history significant for Type I DM and hypothyroidism with recent rib fractures now with intractable N/V due to pain meds. In ER, pt noted to be dehydrated and hypotensive with acute renal failure.  1. Intractable nausea/vomiting in the setting of acute renal failure with acidosis(positive BHBA ad positive ketones in UA) and non-AG acidosis  in DM-1 -pt has been off inuslin pump. She has been having labile sugars at home and issues with her pump site and funtionning of it(it was last check by Dr Tedd Sias to ensure  Proper functioning in the clinic) -poor po intake -IV insulin gtt -IVF for hydraiton -dietitian for Diabetic diet -pt has labile DM and becomes hypoglycemic easily  2. Hyperkalemia Suspected due to ARF and dehydration -LK abck to normal  3. gen weaknes and falls at home PT  to see pt  4. Relative hypotension Cont IVF  5.hypothyroidism Cont synthroid  6.PT to eval pt once off Insulin gtt  Case discussed with Care Management/Social Worker. Management plans discussed with the patient, family and they are in agreement.  CODE STATUS: full  DVT Prophylaxis:lovenox  TOTAL criticalTIME TAKING CARE OF THIS PATIENT: 35 minutes.  >50% time spent on counselling and coordination of care t, RN and Dr Tedd Sias  POSSIBLE D/C IN 2-3 DAYS, DEPENDING ON CLINICAL CONDITION.   Connery Shiffler M.D on 01/21/2015 at 11:08 AM  Between 7am to 6pm - Pager - [phone]  After 6pm go to [email or web address] - password [email or web address] [facility]  [place] [facility]   Office  [phone]  CC: Primary care physician; Lauro Regulus., MD

## 2015-01-21 NOTE — Evaluation (Signed)
Physical Therapy Evaluation Patient Details Name: [name] MRN: [id number] DOB: Oct 16, 1945 Today's Date: 01/21/2015   History of Present Illness  Patient is a 69 y/o female that presents from home with intractible nausea and vomiting. 2 days  prior to this admission she fell on her steps and broke 4 ribs on her left side.   Clinical Impression  Patient has been independent with ambulation up until her fall x 2 days ago, and presents with markedly decreased OOB mobility secondary to pain, lethargy, and weakness. Patient experienced a fall 2 days ago fracturing several ribs and has had a significant decrease in mobility since that time. Patient provided with RW during today's session and requires min A x 1 to complete sit to stand transfer and is not able to tolerate prolonged ambulation. Patient takes minimal step lengths and is a very high falls risk at this time. Skilled acute PT services are indicated to address her mobility deficits.     Follow Up Recommendations SNF    Equipment Recommendations       Recommendations for Other Services       Precautions / Restrictions Precautions Precautions: Fall Precaution Comments: 4 rib fractures on left lower side.  Restrictions Weight Bearing Restrictions: No      Mobility  Bed Mobility               General bed mobility comments: Patient received in sitting.   Transfers Overall transfer level: Needs assistance Equipment used: Rolling walker (2 wheeled) Transfers: Sit to/from Stand Sit to Stand: Min assist         General transfer comment: Patient requires minimal assist under RUE and gait belt with vc's for hand placement and prolonged time to complete transfer safely.   Ambulation/Gait Ambulation/Gait assistance: Min guard Ambulation Distance (Feet): 15 Feet Assistive device: Rolling walker (2 wheeled) Gait Pattern/deviations: Decreased step length - right;Decreased step length - left;Trunk flexed   Gait  velocity interpretation: Below normal speed for age/gender General Gait Details: Patient takes minimal step lengths with RW secondary to lethargy, LE weakness, and pain.   Stairs            Wheelchair Mobility    Modified Rankin (Stroke Patients Only)       Balance Overall balance assessment: Needs assistance Sitting-balance support: Feet supported Sitting balance-Leahy Scale: Good Sitting balance - Comments: Patient received in sitting in the chair.    Standing balance support: Bilateral upper extremity supported Standing balance-Leahy Scale: Fair Standing balance comment: Patient ambulated with RW with minimal step lengths secondary to LE weakness and pain in her ribs.                              Pertinent Vitals/Pain Pain Assessment:  (Patient does not rate her pain but states she is not comfortable throughout the session.)    Home Living                        Prior Function                 Hand Dominance        Extremity/Trunk Assessment   Upper Extremity Assessment: Overall WFL for tasks assessed           Lower Extremity Assessment: Generalized weakness         Communication      Cognition Arousal/Alertness: Lethargic Behavior During Therapy: [facility] for  tasks assessed/performed;Flat affect Overall Cognitive Status: Within Functional Limits for tasks assessed                      General Comments      Exercises        Assessment/Plan    PT Assessment Patient needs continued PT services  PT Diagnosis Difficulty walking;Generalized weakness   PT Problem List Decreased strength;Decreased balance;Decreased mobility;Decreased safety awareness;Decreased knowledge of use of DME;Decreased cognition  PT Treatment Interventions DME instruction;Gait training;Therapeutic activities;Therapeutic exercise;Balance training   PT Goals (Current goals can be found in the Care Plan section) Acute Rehab PT Goals Patient  Stated Goal: To return to home mobility.  PT Goal Formulation: With patient/family Time For Goal Achievement: 02/04/15 Potential to Achieve Goals: Good    Frequency Min 2X/week   Barriers to discharge        Co-evaluation               End of Session Equipment Utilized During Treatment: Gait belt Activity Tolerance: Patient limited by fatigue;Patient limited by lethargy;Patient tolerated treatment well Patient left: in chair;with call bell/phone within reach;with chair alarm set;with family/visitor present Nurse Communication: Mobility status         Time: 1430-1502 PT Time Calculation (min) (ACUTE ONLY): 32 min   Charges:   PT Evaluation $Initial PT Evaluation Tier I: 1 Procedure PT Treatments $Therapeutic Activity: 8-22 mins (Patient assisted to bedside commode for toileting. )   PT G Codes:      Kerin Ransom, PT, DPT    01/21/2015, 5:11 PM

## 2015-01-21 NOTE — Clinical Documentation Improvement (Signed)
Hospitalist or Endocrinologist  "DKA" is documented in the current medical record.  For accurate coding purposes, please clarify and document in the progress notes and discharge summary if the patient has actually had "DKA" during this admission.  Clinical Information:  - "Intractable nausea/vomiting in the setting of acute renal failure with acidosis(poitive BHBA) and DKA in DM-1" was documented in the hospitalist's progress note on 01/20/15 and again on 01/21/15 filed at 1:31 PM.  - However, there is an additional hospitalist progress note on 01/21/15 filed at 3:02 PM that does not include the diagnosis of DKA.  - "Recurrent DKA" (medical history section) and "Uncontrolled type 1 diabetes" (assesment section) is documented in the Endocrinology consult note.   Thank You, Jerral Ralph RN BSN CCDS [phone] Health Information Management [place]

## 2015-01-22 DIAGNOSIS — E440 Moderate protein-calorie malnutrition: Secondary | ICD-10-CM | POA: Insufficient documentation

## 2015-01-22 LAB — GLUCOSE, CAPILLARY
GLUCOSE-CAPILLARY: 108 mg/dL — AB (ref 65–99)
GLUCOSE-CAPILLARY: 113 mg/dL — AB (ref 65–99)
GLUCOSE-CAPILLARY: 118 mg/dL — AB (ref 65–99)
GLUCOSE-CAPILLARY: 126 mg/dL — AB (ref 65–99)
GLUCOSE-CAPILLARY: 134 mg/dL — AB (ref 65–99)
GLUCOSE-CAPILLARY: 138 mg/dL — AB (ref 65–99)
GLUCOSE-CAPILLARY: 142 mg/dL — AB (ref 65–99)
GLUCOSE-CAPILLARY: 143 mg/dL — AB (ref 65–99)
GLUCOSE-CAPILLARY: 146 mg/dL — AB (ref 65–99)
GLUCOSE-CAPILLARY: 200 mg/dL — AB (ref 65–99)
GLUCOSE-CAPILLARY: 218 mg/dL — AB (ref 65–99)
GLUCOSE-CAPILLARY: 228 mg/dL — AB (ref 65–99)
GLUCOSE-CAPILLARY: 71 mg/dL (ref 65–99)
Glucose-Capillary: 182 mg/dL — ABNORMAL HIGH (ref 65–99)
Glucose-Capillary: 151 mg/dL — ABNORMAL HIGH (ref 65–99)
Glucose-Capillary: 127 mg/dL — ABNORMAL HIGH (ref 65–99)
Glucose-Capillary: 150 mg/dL — ABNORMAL HIGH (ref 65–99)
Glucose-Capillary: 183 mg/dL — ABNORMAL HIGH (ref 65–99)
Glucose-Capillary: 177 mg/dL — ABNORMAL HIGH (ref 65–99)
Glucose-Capillary: 126 mg/dL — ABNORMAL HIGH (ref 65–99)
Glucose-Capillary: 140 mg/dL — ABNORMAL HIGH (ref 65–99)
Glucose-Capillary: 189 mg/dL — ABNORMAL HIGH (ref 65–99)
Glucose-Capillary: 247 mg/dL — ABNORMAL HIGH (ref 65–99)
Glucose-Capillary: 189 mg/dL — ABNORMAL HIGH (ref 65–99)

## 2015-01-22 LAB — URINALYSIS COMPLETE WITH MICROSCOPIC (ARMC ONLY)
BACTERIA UA: NONE SEEN
Bilirubin Urine: NEGATIVE
Hgb urine dipstick: NEGATIVE
Leukocytes, UA: NEGATIVE
Nitrite: NEGATIVE
PROTEIN: NEGATIVE mg/dL
Specific Gravity, Urine: 1.011 (ref 1.005–1.030)
pH: 5 (ref 5.0–8.0)

## 2015-01-22 LAB — BASIC METABOLIC PANEL
Anion gap: 9 (ref 5–15)
BUN: 7 mg/dL (ref 6–20)
CHLORIDE: 107 mmol/L (ref 101–111)
CO2: 15 mmol/L — AB (ref 22–32)
CREATININE: 0.81 mg/dL (ref 0.44–1.00)
Calcium: 7.9 mg/dL — ABNORMAL LOW (ref 8.9–10.3)
GFR calc Af Amer: >60 mL/min (ref >60)
GFR calc non Af Amer: >60 mL/min (ref >60)
GLUCOSE: 229 mg/dL — AB (ref 65–99)
POTASSIUM: 4.1 mmol/L (ref 3.5–5.1)
SODIUM: 131 mmol/L — AB (ref 135–145)

## 2015-01-22 LAB — KETONES, URINE

## 2015-01-22 MED ORDER — DEXTROSE 5 % IV SOLN
INTRAVENOUS | Status: DC
  Administered 2015-01-22: 50 mL via INTRAVENOUS
  Administered 2015-01-23: 08:00:00 via INTRAVENOUS

## 2015-01-22 NOTE — Progress Notes (Signed)
[name] is a 69 y.o. female seen in follow up for type 1 diabetes. She continues to receive IV insulin. We have not yet restarted her insulin pump because we are awaiting family to bring in the cap to the battery part of the pump. Sugars today have been in the 130-222 range. She continues to have N/V. She continues not to tolerate PO. Nursing staff is concerned about her mentation as she seems excessively sleepy. [name] has been up and out of bed to the bedside commode. She denies dysuria and hematuria.  Medical history  Diabetes mellitus type 1  A. diagnosis in 1990s  B. mild diabetic retinopathy, last eye exam 09/27/12  C. hypoglycemic seizure 04/2013  D. Recurrent DKA Hypothyroidism  Medications . aspirin EC  81 mg Oral Daily  . docusate sodium  100 mg Oral BID  . enoxaparin (LOVENOX) injection  40 mg Subcutaneous Q24H  . insulin regular  0-10 Units Intravenous TID WC  . levothyroxine  100 mcg Oral QAC breakfast  . loratadine  10 mg Oral Daily  . sodium chloride  3 mL Intravenous Q12H  . cyanocobalamin  100 mcg Oral Daily     Social history Social History  Substance Use Topics  . Smoking status: Never Smoker   . Smokeless tobacco: Never Used  . Alcohol Use: 0.6 oz/week    1 Glasses of wine, 0 Cans of beer, 0 Shots of liquor per week     Comment: Socially (on weekends)     Family history Family History  Problem Relation Age of Onset  . Stroke Mother   . Hypertension Mother   . Hypertension Father   . Coronary artery disease Father 36    MI  . Hypertension Sister   . Diabetes Cousin     type 1  . Cancer Maternal Aunt     brain  . Hyperlipidemia Brother      Review of systems CV: no chest pain or palpitations PULM: no cough or shortness of breath ABD: no abdominal pain or nausea or vomiting   Physical Exam BP 98/54 mmHg  Pulse 106  Temp(Src) 98.5 F (36.9 C) (Oral)  Resp 16  Ht 5\' 1"  (1.549 m)  Wt 56.8 kg (125 lb 3.5 oz)  BMI  23.67 kg/m2  SpO2 98%  GEN: thin WF, in NAD. HEENT: No proptosis, EOMI, lid lag or stare. Oropharynx is clear.  NECK: supple, trachea midline.   RESPIRATORY: clear bilaterally, no wheeze, good inspiratory effort. CV: No carotid bruits, RRR. MUSCULOSKELETAL: no clubbing, no tremor ABD: soft, NT/ND, no organomegaly EXT: no peripheral edema SKIN: no dermatopathy or rash or acanthosis nigricans.  LYMPH: no submandibular or supraclavicular LAD NEURO: PERRL PSYC: alert and oriented, fair insight  Labs Results for orders placed or performed during the hospital encounter of 01/19/15 (from the past 24 hour(s))  Glucose, capillary     Status: Abnormal   Collection Time: 01/21/15  2:00 PM  Result Value Ref Range   Glucose-Capillary 149 (H) 65 - 99 mg/dL  Glucose, capillary     Status: Abnormal   Collection Time: 01/21/15  3:08 PM  Result Value Ref Range   Glucose-Capillary 224 (H) 65 - 99 mg/dL  Glucose, capillary     Status: Abnormal   Collection Time: 01/21/15  3:18 PM  Result Value Ref Range   Glucose-Capillary 177 (H) 65 - 99 mg/dL  Glucose, capillary     Status: Abnormal   Collection Time: 01/21/15  4:04 PM  Result Value Ref Range   Glucose-Capillary 216 (H) 65 - 99 mg/dL  Glucose, capillary     Status: Abnormal   Collection Time: 01/21/15  5:00 PM  Result Value Ref Range   Glucose-Capillary 231 (H) 65 - 99 mg/dL  Glucose, capillary     Status: Abnormal   Collection Time: 01/21/15  6:02 PM  Result Value Ref Range   Glucose-Capillary 225 (H) 65 - 99 mg/dL  Glucose, capillary     Status: Abnormal   Collection Time: 01/21/15  7:28 PM  Result Value Ref Range   Glucose-Capillary 153 (H) 65 - 99 mg/dL  Glucose, capillary     Status: Abnormal   Collection Time: 01/21/15  8:41 PM  Result Value Ref Range   Glucose-Capillary 109 (H) 65 - 99 mg/dL  Glucose, capillary     Status: None   Collection Time: 01/21/15  9:41 PM  Result Value Ref Range   Glucose-Capillary 86 65 - 99 mg/dL   Basic metabolic panel     Status: Abnormal   Collection Time: 01/21/15  9:50 PM  Result Value Ref Range   Sodium 136 135 - 145 mmol/L   Potassium 4.2 3.5 - 5.1 mmol/L   Chloride 112 (H) 101 - 111 mmol/L   CO2 17 (L) 22 - 32 mmol/L   Glucose, Bld 86 65 - 99 mg/dL   BUN 8 6 - 20 mg/dL   Creatinine, Ser 8.11 0.44 - 1.00 mg/dL   Calcium 8.6 (L) 8.9 - 10.3 mg/dL   GFR calc non Af Amer >60 >60 mL/min   GFR calc Af Amer >60 >60 mL/min   Anion gap 7 5 - 15  Glucose, capillary     Status: None   Collection Time: 01/21/15 10:45 PM  Result Value Ref Range   Glucose-Capillary 71 65 - 99 mg/dL  Glucose, capillary     Status: None   Collection Time: 01/21/15 11:46 PM  Result Value Ref Range   Glucose-Capillary 97 65 - 99 mg/dL  Glucose, capillary     Status: Abnormal   Collection Time: 01/22/15 12:47 AM  Result Value Ref Range   Glucose-Capillary 126 (H) 65 - 99 mg/dL  Glucose, capillary     Status: Abnormal   Collection Time: 01/22/15  1:03 AM  Result Value Ref Range   Glucose-Capillary 134 (H) 65 - 99 mg/dL  Glucose, capillary     Status: Abnormal   Collection Time: 01/22/15  2:08 AM  Result Value Ref Range   Glucose-Capillary 182 (H) 65 - 99 mg/dL  Glucose, capillary     Status: Abnormal   Collection Time: 01/22/15  2:56 AM  Result Value Ref Range   Glucose-Capillary 151 (H) 65 - 99 mg/dL  Glucose, capillary     Status: Abnormal   Collection Time: 01/22/15  4:10 AM  Result Value Ref Range   Glucose-Capillary 127 (H) 65 - 99 mg/dL  Glucose, capillary     Status: Abnormal   Collection Time: 01/22/15  5:12 AM  Result Value Ref Range   Glucose-Capillary 146 (H) 65 - 99 mg/dL  Glucose, capillary     Status: Abnormal   Collection Time: 01/22/15  6:14 AM  Result Value Ref Range   Glucose-Capillary 143 (H) 65 - 99 mg/dL  Glucose, capillary     Status: Abnormal   Collection Time: 01/22/15  7:19 AM  Result Value Ref Range   Glucose-Capillary 150 (H) 65 - 99 mg/dL  Glucose,  capillary  Status: Abnormal   Collection Time: 01/22/15  8:25 AM  Result Value Ref Range   Glucose-Capillary 113 (H) 65 - 99 mg/dL  Glucose, capillary     Status: Abnormal   Collection Time: 01/22/15  9:25 AM  Result Value Ref Range   Glucose-Capillary 138 (H) 65 - 99 mg/dL  Glucose, capillary     Status: Abnormal   Collection Time: 01/22/15 10:22 AM  Result Value Ref Range   Glucose-Capillary 183 (H) 65 - 99 mg/dL  Glucose, capillary     Status: Abnormal   Collection Time: 01/22/15 11:31 AM  Result Value Ref Range   Glucose-Capillary 228 (H) 65 - 99 mg/dL  Glucose, capillary     Status: Abnormal   Collection Time: 01/22/15 12:19 PM  Result Value Ref Range   Glucose-Capillary 200 (H) 65 - 99 mg/dL  Ketones, urine     Status: Abnormal   Collection Time: 01/22/15 12:55 PM  Result Value Ref Range   Ketones, ur 2+ (A) NEGATIVE mg/dL  Glucose, capillary     Status: Abnormal   Collection Time: 01/22/15  1:17 PM  Result Value Ref Range   Glucose-Capillary 177 (H) 65 - 99 mg/dL     Assessment 1. Persistent acidosis with + urinary ketones, c/w DKA 2. Uncontrolled type 1 diabetes   Plan 1. Will restart D5W in effort to increase rate of IV insulin. Recommend continuing this until urinary ketones resolve. Suspect her persistent acidosis is causing her drowsiness and N/V.  2. Continue IV insulin drip for now. Will not restart insulin pump today. Need family to bring in battery cap for the pump.  3. Obtain repeat UA. Urinalysis on admission did show small amount of leukocytes. She has been on Keflex (not sure why - for UTI?). Will DC Keflex. Will add appropriate abx if UA + for UTI.

## 2015-01-22 NOTE — Progress Notes (Signed)
Nutrition Follow-up  DOCUMENTATION CODES:   Non-severe (moderate) malnutrition in context of acute illness/injury  INTERVENTION:   Meals and Snacks: Cater to patient preferences; recommend smaller, more frequent meals; pt agreeable to snacks between meals Medical Food Supplement Therapy: discussed nutritional supplementation with pt; pt agreeable to trying No Sugar Added Carnation Instant Breakfast; will send TID on meal trays.   NUTRITION DIAGNOSIS:   Inadequate oral intake related to inability to eat as evidenced by NPO status. Continues but being addressed as diet advanced, catering to preferences, adding snacks and supplements  GOAL:   Patient will meet greater than or equal to 90% of their needs   MONITOR:    (Energy Intake, Glucose Profile, Digestive System)  ASSESSMENT:   Pt remains on Insulin drip; plan to restart pt's insulin pump once cap for battery brought in by family  Diet Order:  Diet Carb Modified Fluid consistency:: Thin; Room service appropriate?: Yes   Energy Intake: pt drank milk and ate bites of blueberry muffin this AM; pt reports she did not get what she asked for on her breakfast tray. Writer offered to have new tray sent up with them items she requested then pt stated that she didn't know what she ordered. Writer then reviewed food choices with pt for breakfast, pt then stated she was ok and she would wait until lunch. Recorded po intake mostly 0-5% of meals, although one meal recorded as 100%. Pt complains of nausea on meal trays; reports N/V for at least 1 week prior to admission.   Food and Nutrition related history: pt reports appetite is good at home; reports she will eat meals like "fried chicken, creamed potatoes and green beans" but reports she only eats 2 meals per day. Typically does not eat breakfast; reports she was drinking Special K shakes for breakfast but then stopped; she initially indicates that this was because the MD told her it was "too  high in protein" but then stated it was because it was too high in potassium. Pt reports poor intake for 1 week prior to admission due to N/V, inability to keep much of anything down. Per Loistine Simas, family reports different story. Report that pt has not been eating well for a while.    Nutrition Focused Physical Exam: Nutrition-Focused physical exam completed. Findings are wdl for fat depletion, mild muscle depletion, and no edema.   Electrolyte and Renal Profile:  Recent Labs Lab 01/21/15 0448 01/21/15 0803 01/21/15 2150  BUN CREATININE 1.00 1.01* 0.71  NA 136 136 136  K 4.8 5.0 4.2   Glucose Profile:   Recent Labs  01/22/15 1131 01/22/15 1219 01/22/15 1317  GLUCAP 228* 200* 177*    Meds: insulin drip, NS at 75 ml/hr, D5 at 50 ml/hr, zofran  Height:   Ht Readings from Last 1 Encounters:  01/19/15  (1.549 m)    Weight: Pt reports UBW around 121 pounds; weight trend this admission all over the place  Wt Readings from Last 1 Encounters:  01/22/15 125 lb 3.5 oz (56.8 kg)   Filed Weights   01/20/15 1330 01/21/15 0436 01/22/15 0550  Weight: 120 lb 9.5 oz (54.7 kg) 142 lb 3.2 oz (64.5 kg) 125 lb 3.5 oz (56.8 kg)    BMI:  Body mass index is 23.67 kg/(m^2).  Estimated Nutritional Needs:   Kcal:  [id number], BEE: 1036kcals, TEE; (IF 1.1-1.3)(AF 1.2)  Protein:  45-56g protein (0.8-1.0g/kg)   Fluid:  1410-1694mL of fluid (25-29mL/kg)  EDUCATION  NEEDS:   No education needs identified at this time  HIGH Care Level  Romelle Starcher MS, RD, LDN [phone] Pager

## 2015-01-22 NOTE — Clinical Social Work Note (Signed)
Clinical Social Work Assessment  Patient Details  Name: [name] MRN: [id number] Date of Birth: 09/15/1945  Date of referral:  01/22/15               Reason for consult:  Facility Placement                Permission sought to share information with:  Facility Art therapist granted to share information::  Yes, Verbal Permission Granted  Name::        Agency::     Relationship::     Contact Information:     Housing/Transportation Living arrangements for the past 2 months:   (home) Source of Information:  Patient Patient Interpreter Needed:  None Criminal Activity/Legal Involvement Pertinent to Current Situation/Hospitalization:  No - Comment as needed Significant Relationships:    Lives with:  Self Do you feel safe going back to the place where you live?  Yes Need for family participation in patient care:  No (Coment)  Care giving concerns:  Patient is reported to live with her husband.   Social Worker assessment / plan:  CSW informed by physician in progression rounds this morning that patient is ready for discharge and will need rehab. CSW met with patient this morning and informed her of PT's recommendation and she stated she would be willing to do rehab in a facility. Patient was reporting having nausea but no vomiting during our conversation. CSW initiated a bedsearch. CSW later returned to extend bed offers and patient was sleeping and would not awaken. CSW left and returned some time later and patient was still sleeping and briefly awakened at the call of her name but then quickly fell back asleep. CSW unable to extend bed offers due to this. Patient's nurse stated that patient was given zofran for nausea. CSW will attempt again to extend bed offers later today.  Employment status:  Disabled (Comment on whether or not currently receiving Disability) Insurance information:  Managed Medicare PT Recommendations:  [place] /  Referral to community resources:  [place]  Patient/Family's Response to care:  Paitent lethargic but in agreement with rehab.  Patient/Family's Understanding of and Emotional Response to Diagnosis, Current Treatment, and Prognosis:  Patient too lethargic upon CSW return to have bed offers extended. MD paged to notify.  Emotional Assessment Appearance:  Appears stated age Attitude/Demeanor/Rapport:   (lethargic, pleasant and cooperative) Affect (typically observed):  Accepting, Appropriate Orientation:  Oriented to Self, Oriented to Place, Oriented to  Time, Oriented to Situation Alcohol / Substance use:  Not Applicable Psych involvement (Current and /or in the community):  No (Comment)  Discharge Needs  Concerns to be addressed:  Care Coordination Readmission within the last 30 days:  No Current discharge risk:  None Barriers to Discharge:  No Barriers Identified   Shela Leff, LCSW 01/22/2015, 12:13 PM

## 2015-01-22 NOTE — Clinical Social Work Note (Signed)
CSW returned to see patient and this time she was alert and awake and her husband was visiting. Bed offers extended and she has chosen [facility]. Doug informed of acceptance. MD notified and discharge will be for tomorrow. York Spaniel MSW,LCSW [phone]

## 2015-01-22 NOTE — Progress Notes (Signed)
[facility] [place] [facility]   PATIENT NAME: [name]    MR#:  [id number]  DATE OF BIRTH:  [birth year]  SUBJECTIVE:  Came in with intractable nausea and vomiting. Not eating well at home. Labile sugars. Pt took off her pump few days ago Seems ok but not her usual self. Eating BF this am sugars so far stable Awaiting family to bring her insulin  REVIEW OF SYSTEMS:   Review of Systems  Constitutional: Positive for malaise/fatigue. Negative for fever, chills and weight loss.  HENT: Negative for ear discharge, ear pain and nosebleeds.   Eyes: Negative for blurred vision, pain and discharge.  Respiratory: Negative for sputum production, shortness of breath, wheezing and stridor.   Cardiovascular: Negative for chest pain, palpitations, orthopnea and PND.  Gastrointestinal: Positive for nausea. Negative for vomiting, abdominal pain and diarrhea.  Genitourinary: Negative for urgency and frequency.  Musculoskeletal: Positive for back pain. Negative for joint pain.  Neurological: Positive for weakness. Negative for sensory change, speech change and focal weakness.  Psychiatric/Behavioral: Negative for depression and hallucinations. The patient is not nervous/anxious.   All other systems reviewed and are negative.  Tolerating Diet:yes Tolerating PT: STR  DRUG ALLERGIES:  No Known Allergies  VITALS:  Blood pressure 111/63, pulse 102, temperature 98.9 F (37.2 C), temperature source Oral, resp. rate 14, height 5\' 1"  (1.549 m), weight 56.8 kg (125 lb 3.5 oz), SpO2 97 %.  PHYSICAL EXAMINATION:   Physical Exam  GENERAL:  69 y.o.-year-old patient lying in the bed with no acute distress. Weak, fraile,thin EYES: Pupils equal, round, reactive to light and accommodation. No scleral icterus. Extraocular muscles intact.  HEENT: Head atraumatic, normocephalic. Oropharynx and nasopharynx clear. Dry oral mucosa NECK:  Supple, no jugular venous distention. No  thyroid enlargement, no tenderness.  LUNGS: Normal breath sounds bilaterally, no wheezing, rales, rhonchi. No use of accessory muscles of respiration.  CARDIOVASCULAR: S1, S2 normal. No murmurs, rubs, or gallops.  ABDOMEN: Soft, nontender, nondistended. Bowel sounds present. No organomegaly or mass.  EXTREMITIES: No cyanosis, clubbing or edema b/l.    NEUROLOGIC: Cranial nerves II through XII are intact. No focal Motor or sensory deficits b/l.   PSYCHIATRIC: The patient is alert and oriented x 2.  SKIN: No obvious rash, lesion, or ulcer. Dry skin   LABORATORY PANEL:   CBC  Recent Labs Lab 01/20/15 0525  WBC 8.4  HGB 10.2*  HCT 31.9*  PLT 185    Chemistries   Recent Labs Lab 01/20/15 0525  01/21/15 2150  NA 137  < > 136  K 6.7*  < > 4.2  CL 113*  < > 112*  CO2 13*  < > 17*  GLUCOSE 262*  < > 86  BUN 27*  < > 8  CREATININE 1.59*  < > 0.71  CALCIUM 8.5*  < > 8.6*  AST 22  --   --   ALT 15  --   --   ALKPHOS 87  --   --   BILITOT 0.3  --   --   < > = values in this interval not displayed.  Cardiac Enzymes  Recent Labs Lab 01/20/15 0525  TROPONINI 0.04*    RADIOLOGY:  No results found. ASSESSMENT AND PLAN:   69 y.o. female has a past medical history significant for Type I DM and hypothyroidism with recent rib fractures now with intractable N/V due to pain meds. In ER, pt noted to be dehydrated and hypotensive with  acute renal failure.  1. Intractable nausea/vomiting in the setting of acute renal failure with acidosis(poitive BHBA) and DKA in DM-1 -pt has been off inuslin pump. She has been having labile sugars at home and issues with her pump site and funtionning of it(it was last chaeck by Dr Tedd Sias to ensure  Proper functioning in the clinic) -poor po intake -IV insulin gtt off due to hypoglycemia--->change to insulin pump once family brings in the via -IVF for hydraiton -dietitian for Diabetic diet -pt has labile DM and becomes hypoglycemic  easily. -appreciate Dr Pricilla Handler input appreciate  2. Hyperkalemia Suspected due to ARF and dehydration -K back to normal  3. gen weaknes and falls at home PT to see pt  4. Relative hypotension D/c ivf if cotn to eat better  5.hypothyroidism Cont synthroid  6.PT eval noted. Rehab recommneded. Pt and dter ok with it. CSW for d/c planning Case discussed with Care Management/Social Worker. Management plans discussed with the patient, family and they are in agreement.  CODE STATUS: full  DVT Prophylaxis:lovenox  TOTAL criticalTIME TAKING CARE OF THIS PATIENT: 35 minutes.  >50% time spent on counselling and coordination of care t, RN and Dr Tedd Sias  POSSIBLE D/C IN 2-3 DAYS, DEPENDING ON CLINICAL CONDITION.   Deon Ivey M.D on 01/22/2015 at 11:21 AM  Between 7am to 6pm - Pager - [phone]  After 6pm go to [email or web address] - password [email or web address] [facility]  [place] [facility]  Office  [phone]  CC: Primary care physician; Lauro Regulus., MD

## 2015-01-23 DIAGNOSIS — E119 Type 2 diabetes mellitus without complications: Secondary | ICD-10-CM | POA: Diagnosis not present

## 2015-01-23 DIAGNOSIS — E785 Hyperlipidemia, unspecified: Secondary | ICD-10-CM | POA: Diagnosis not present

## 2015-01-23 DIAGNOSIS — D509 Iron deficiency anemia, unspecified: Secondary | ICD-10-CM | POA: Diagnosis not present

## 2015-01-23 DIAGNOSIS — E1065 Type 1 diabetes mellitus with hyperglycemia: Secondary | ICD-10-CM | POA: Diagnosis not present

## 2015-01-23 DIAGNOSIS — R51 Headache: Secondary | ICD-10-CM | POA: Diagnosis not present

## 2015-01-23 DIAGNOSIS — E039 Hypothyroidism, unspecified: Secondary | ICD-10-CM | POA: Diagnosis not present

## 2015-01-23 DIAGNOSIS — G40909 Epilepsy, unspecified, not intractable, without status epilepticus: Secondary | ICD-10-CM | POA: Diagnosis not present

## 2015-01-23 DIAGNOSIS — G4489 Other headache syndrome: Secondary | ICD-10-CM | POA: Diagnosis not present

## 2015-01-23 DIAGNOSIS — D519 Vitamin B12 deficiency anemia, unspecified: Secondary | ICD-10-CM | POA: Diagnosis not present

## 2015-01-23 DIAGNOSIS — N179 Acute kidney failure, unspecified: Secondary | ICD-10-CM | POA: Diagnosis not present

## 2015-01-23 DIAGNOSIS — E1010 Type 1 diabetes mellitus with ketoacidosis without coma: Secondary | ICD-10-CM | POA: Diagnosis not present

## 2015-01-23 DIAGNOSIS — R112 Nausea with vomiting, unspecified: Secondary | ICD-10-CM | POA: Diagnosis not present

## 2015-01-23 DIAGNOSIS — D649 Anemia, unspecified: Secondary | ICD-10-CM | POA: Diagnosis not present

## 2015-01-23 DIAGNOSIS — E860 Dehydration: Secondary | ICD-10-CM | POA: Diagnosis not present

## 2015-01-23 DIAGNOSIS — E108 Type 1 diabetes mellitus with unspecified complications: Secondary | ICD-10-CM | POA: Diagnosis not present

## 2015-01-23 LAB — BASIC METABOLIC PANEL
ANION GAP: 5 (ref 5–15)
BUN: 6 mg/dL (ref 6–20)
CALCIUM: 7.7 mg/dL — AB (ref 8.9–10.3)
CO2: 20 mmol/L — AB (ref 22–32)
Chloride: 107 mmol/L (ref 101–111)
Creatinine, Ser: 0.7 mg/dL (ref 0.44–1.00)
GFR calc Af Amer: >60 mL/min (ref >60)
GFR calc non Af Amer: >60 mL/min (ref >60)
GLUCOSE: 98 mg/dL (ref 65–99)
POTASSIUM: 3.9 mmol/L (ref 3.5–5.1)
Sodium: 132 mmol/L — ABNORMAL LOW (ref 135–145)

## 2015-01-23 LAB — URINE CULTURE: CULTURE: NO GROWTH

## 2015-01-23 LAB — GLUCOSE, CAPILLARY
GLUCOSE-CAPILLARY: 92 mg/dL (ref 65–99)
GLUCOSE-CAPILLARY: 103 mg/dL — AB (ref 65–99)
GLUCOSE-CAPILLARY: 130 mg/dL — AB (ref 65–99)
GLUCOSE-CAPILLARY: 148 mg/dL — AB (ref 65–99)
GLUCOSE-CAPILLARY: 156 mg/dL — AB (ref 65–99)
GLUCOSE-CAPILLARY: 290 mg/dL — AB (ref 65–99)
Glucose-Capillary: 106 mg/dL — ABNORMAL HIGH (ref 65–99)
Glucose-Capillary: 116 mg/dL — ABNORMAL HIGH (ref 65–99)
Glucose-Capillary: 129 mg/dL — ABNORMAL HIGH (ref 65–99)
Glucose-Capillary: 130 mg/dL — ABNORMAL HIGH (ref 65–99)
Glucose-Capillary: 137 mg/dL — ABNORMAL HIGH (ref 65–99)
Glucose-Capillary: 156 mg/dL — ABNORMAL HIGH (ref 65–99)

## 2015-01-23 MED ORDER — INSULIN PUMP
Freq: Three times a day (TID) | SUBCUTANEOUS | Status: DC
  Administered 2015-01-23: 13:00:00 via SUBCUTANEOUS
  Filled 2015-01-23: qty 1

## 2015-01-23 MED ORDER — TRAMADOL HCL 50 MG PO TABS: 50.0000 mg | ORAL_TABLET | Freq: Two times a day (BID) | ORAL | Status: AC | PRN

## 2015-01-23 NOTE — Care Management Important Message (Signed)
Important Message  Patient Details  Name: [name] MRN: [id number] Date of Birth: 1945-10-03   Medicare Important Message Given:  Yes-third notification given    [name] 01/23/2015, 10:03 AM

## 2015-01-23 NOTE — Progress Notes (Signed)
Patient's blood sugar 290. I informed patient and she adjusted insulin pump dose. I notified Dr. Tedd Sias and she said she will be okay to discharge.

## 2015-01-23 NOTE — Progress Notes (Signed)
Dr. Tedd Sias in to see patient. She assisted patient with insulin pump adjusted rates and applied. Insulin gtt remains off at this time. Blood sugar is 129.

## 2015-01-23 NOTE — Discharge Instructions (Signed)
ADA 1800 calorie diet PT at rehab

## 2015-01-23 NOTE — Progress Notes (Signed)
Inpatient Diabetes Program Recommendations  AACE/ADA: New Consensus Statement on Inpatient Glycemic Control (2013)  Target Ranges:  Prepandial:   less than 140 mg/dL      Peak postprandial:   less than 180 mg/dL (1-2 hours)      Critically ill patients:  140 - 180 mg/dL   Reason for Visit: assess insulin pump management  [employer] insulin pump restarted by MD/patient this am. Patient is currently eating lunch and has not bolused for her meal because she was unsure how much she would eat; vomited this morning.   Current settings: BASAL RATES: 12 am 0.2 units/hr, 9:30 am 0.275 units/hr, 10 pm 0.25 units/hr (24-hr basal = 6.975 units)  Susette Racer, RN, BA, [place], CDE Diabetes Coordinator Inpatient Diabetes Program  [phone] (Team Pager) [phone] [facility] Office) 01/23/2015 1:43 PM

## 2015-01-23 NOTE — Clinical Social Work Note (Signed)
Patient to discharge today to [facility]. Patient's daughter, [name], is aware and will transport patient. Doug at [facility] is aware and discharge summary sent. Doug updated on the fact that patient had her insulin pump placed back on this morning. York Spaniel MSW,LCSW [phone]

## 2015-01-23 NOTE — Progress Notes (Signed)
Patient vomited after taking her medications. PRN zofran given.

## 2015-01-23 NOTE — Discharge Summary (Signed)
[facility] [place] NAME: [name]    MR#:  [id number]  DATE OF BIRTH:  1945/11/18  DATE OF ADMISSION:  01/19/2015 ADMITTING PHYSICIAN: Idelle Crouch, MD  DATE OF DISCHARGE: 01/23/2015  PRIMARY CARE PHYSICIAN: Kirk Ruths., MD    ADMISSION DIAGNOSIS:  Vomiting [R11.10]  DISCHARGE DIAGNOSIS:  Non Anion gap DKA Labile type -1 Dm  SECONDARY DIAGNOSIS:   Past Medical History  Diagnosis Date  . Diabetes mellitus type 1     (prior saw Dr. Moshe Cipro endo, would like to establish locally)  . Generalized headaches     frequent  . Hypothyroidism   . Convulsions/seizures     unknown type, nml EEG  . History of chicken pox   . Anemia   . Vitamin B12 deficiency 03/2012    normal IF    HOSPITAL COURSE:   69 y.o. female has a past medical history significant for Type I DM and hypothyroidism with recent rib fractures now with intractable N/V due to pain meds. In ER, pt noted to be dehydrated and hypotensive with acute renal failure.  1. Intractable nausea/vomiting in the setting of acute renal failure with acidosis(poitive BHBA) and DKA in DM-1 -pt has been off inuslin pump. She has been having labile sugars at home and issues with her pump site and funtionning of it(it was last chaeck by Dr Gabriel Carina to ensure Proper functioning in the clinic) -poor po intake -IV insulin gtt offat present  due to astable sugars--->Dr solum to resume pump rate this am Pt saw dietitian for Diabetic diet -appreciate Dr Joycie Peek input appreciate  2. Hyperkalemia Suspected due to ARF and dehydration -K back to normal  3. gen weaknes and falls at home PT  recommends rehab. To [facility] later today if ok with Dr Gabriel Carina and pt's sugars remain stable after being on the pump  4. Hypothyroidism on synthroid DISCHARGE CONDITIONS:   fair  CONSULTS OBTAINED:   Judi Cong, MD  DRUG ALLERGIES:  No Known Allergies  DISCHARGE  MEDICATIONS:   Current Discharge Medication List    CONTINUE these medications which have NOT CHANGED   Details  Blood Glucose Monitoring Suppl (ONE TOUCH ULTRA 2) W/DEVICE KIT Use as instructed.    cyanocobalamin 1000 MCG tablet Take 100 mcg by mouth daily.    insulin lispro (HUMALOG) 100 UNIT/ML injection Inject 1-40 Units into the skin See admin instructions. Use up to 40 units via pump daily.    levothyroxine (SYNTHROID, LEVOTHROID) 100 MCG tablet Take 100 mcg by mouth daily before breakfast. Refills: 3    loratadine (CLARITIN) 10 MG tablet Take 10 mg by mouth daily.    ONE TOUCH ULTRA TEST test strip 1 each See admin instructions. Use 6 times a day as instructed. Refills: 5        If you experience worsening of your admission symptoms, develop shortness of breath, life threatening emergency, suicidal or homicidal thoughts you must seek medical attention immediately by calling 911 or calling your MD immediately  if symptoms less severe.  You Must read complete instructions/literature along with all the possible adverse reactions/side effects for all the Medicines you take and that have been prescribed to you. Take any new Medicines after you have completely understood and accept all the possible adverse reactions/side effects.   Please note  You were cared for by a hospitalist during your hospital stay. If you have any questions about your discharge medications or the  care you received while you were in the hospital after you are discharged, you can call the unit and asked to speak with the hospitalist on call if the hospitalist that took care of you is not available. Once you are discharged, your primary care physician will handle any further medical issues. Please note that NO REFILLS for any discharge medications will be authorized once you are discharged, as it is imperative that you return to your primary care physician (or establish a relationship with a primary care physician  if you do not have one) for your aftercare needs so that they can reassess your need for medications and monitor your lab values. Today   SUBJECTIVE  Feels ok. Appetite improving  VITAL SIGNS:  Blood pressure 91/70, pulse 84, temperature 98.1 F (36.7 C), temperature source Oral, resp. rate 17, height '5\' 1"'  (1.549 m), weight 56.8 kg (125 lb 3.5 oz), SpO2 100 %.  I/O:   Intake/Output Summary (Last 24 hours) at 01/23/15 0813 Last data filed at 01/23/15 0704  Gross per 24 hour  Intake 1403.84 ml  Output   1120 ml  Net 283.84 ml    PHYSICAL EXAMINATION:  GENERAL:  69 y.o.-year-old patient lying in the bed with no acute distress. thin EYES: Pupils equal, round, reactive to light and accommodation. No scleral icterus. Extraocular muscles intact.  HEENT: Head atraumatic, normocephalic. Oropharynx and nasopharynx clear.  NECK:  Supple, no jugular venous distention. No thyroid enlargement, no tenderness.  LUNGS: Normal breath sounds bilaterally, no wheezing, rales,rhonchi or crepitation. No use of accessory muscles of respiration.  CARDIOVASCULAR: S1, S2 normal. No murmurs, rubs, or gallops.  ABDOMEN: Soft, non-tender, non-distended. Bowel sounds present. No organomegaly or mass.  EXTREMITIES: No pedal edema, cyanosis, or clubbing.  NEUROLOGIC: Cranial nerves II through XII are intact. Muscle strength 5/5 in all extremities. Sensation intact. Gait not checked.  PSYCHIATRIC:  alert and oriented x 3.  SKIN: No obvious rash, lesion, or ulcer.   DATA REVIEW:   CBC   Recent Labs Lab 01/20/15 0525  WBC 8.4  HGB 10.2*  HCT 31.9*  PLT 185    Chemistries   Recent Labs Lab 01/20/15 0525  01/23/15 0345  NA 137  < > 132*  K 6.7*  < > 3.9  CL 113*  < > 107  CO2 13*  < > 20*  GLUCOSE 262*  < > 98  BUN 27*  < > 6  CREATININE 1.59*  < > 0.70  CALCIUM 8.5*  < > 7.7*  AST 22  --   --   ALT 15  --   --   ALKPHOS 87  --   --   BILITOT 0.3  --   --   < > = values in this interval  not displayed.  Microbiology Results   Recent Results (from the past 240 hour(s))  MRSA PCR Screening     Status: None   Collection Time: 01/20/15  1:15 PM  Result Value Ref Range Status   MRSA by PCR NEGATIVE NEGATIVE Final    Comment:        The GeneXpert MRSA Assay (FDA approved for NASAL specimens only), is one component of a comprehensive MRSA colonization surveillance program. It is not intended to diagnose MRSA infection nor to guide or monitor treatment for MRSA infections.     RADIOLOGY:  No results found.   Management plans discussed with the patient, family and they are in agreement.  CODE STATUS:  Code Status Orders        Start     Ordered   01/19/15 1632  Full code   Continuous     01/19/15 1631      TOTAL TIME TAKING CARE OF THIS PATIENT: 40 minutes.    Shanekia Latella M.D on 01/23/2015 at 8:13 AM  Between 7am to 6pm - Pager - [phone] After 6pm go to [email or web address] - password [email or web address] [facility]  Office  [phone]  CC: Primary care physician; Kirk Ruths., MD

## 2015-01-23 NOTE — Progress Notes (Signed)
Report given to Joice at [employer] [employer].

## 2015-01-23 NOTE — Progress Notes (Signed)
Pt rested well throughout shift. Pt c/o pain to left side of ribs which was given ultram which was effective. Pt cont . On insulin drip which is on hold at present per protocol.

## 2015-01-23 NOTE — Clinical Social Work Placement (Signed)
   CLINICAL SOCIAL WORK PLACEMENT  NOTE  Date:  01/23/2015  Patient Details  Name: [name] MRN: [id number] Date of Birth: Nov 24, 1945  Clinical Social Work is seeking post-discharge placement for this patient at the Skilled  Nursing Facility level of care (*CSW will initial, date and re-position this form in  chart as items are completed):  Yes   Patient/family provided with [facility] Clinical Social Work Department's list of facilities offering this level of care within the geographic area requested by the patient (or if unable, by the patient's family).  Yes   Patient/family informed of their freedom to choose among providers that offer the needed level of care, that participate in Medicare, Medicaid or managed care program needed by the patient, have an available bed and are willing to accept the patient.  Yes   Patient/family informed of 's ownership interest in [facility] and [facility], as well as of the fact that they are under no obligation to receive care at these facilities.  PASRR submitted to EDS on 01/15/15     PASRR number received on 01/22/15     Existing PASRR number confirmed on       FL2 transmitted to all facilities in geographic area requested by pt/family on 01/22/15     FL2 transmitted to all facilities within larger geographic area on       Patient informed that his/her managed care company has contracts with or will negotiate with certain facilities, including the following:        Yes   Patient/family informed of bed offers received.  Patient chooses bed at  [facility])     Physician recommends and patient chooses bed at  [facility])    Patient to be transferred to  [facility]) on 01/23/15.  Patient to be transferred to facility by  (daughter)     Patient family notified on 01/23/15 of transfer.  Name of family member notified:   [name])     PHYSICIAN Please sign FL2     Additional Comment:     _______________________________________________ York Spaniel, LCSW 01/23/2015, 11:44 AM

## 2015-01-23 NOTE — Progress Notes (Signed)
Dr. Allena Katz rounded on patient and says she can discharge today if Dr. Tedd Sias is in agreement. I paged Dr. Tedd Sias and she will come see patient this morning and adjust settings to insulin pump and apply to patient.

## 2015-01-23 NOTE — Progress Notes (Addendum)
[name] is a 69 y.o. female seen in follow up for type 1 diabetes. Doing better this morning. No N/C. Eating breakfast. Interested in restarting insulin pump. All pump supplies at bedside. Daughter [name] at bedside. Blood sugars overnight were in the 92-137 range while she was receiving D5 1/2NS at 75 cc/hr and IV insulin at 0-1.1 units/hr.  Medtronic insulin pump was reviewed. Current settings: BASAL RATES: 12 am 0.275 units/hr, 9:30 am 0.3 units/hr (24-hr basal = 6.975 units) BOLUS SETTINGS: I:C ratio 21, sensitivity 100, Target 140, Act insulin time 5 hrs   Medical history  Diabetes mellitus type 1  A. diagnosis in 1990s  B. mild diabetic retinopathy, last eye exam 09/27/12  C. hypoglycemic seizure 04/2013  D. Recurrent DKA Hypothyroidism  Medications . aspirin EC  81 mg Oral Daily  . docusate sodium  100 mg Oral BID  . enoxaparin (LOVENOX) injection  40 mg Subcutaneous Q24H  . levothyroxine  100 mcg Oral QAC breakfast  . loratadine  10 mg Oral Daily  . sodium chloride  3 mL Intravenous Q12H  . cyanocobalamin  100 mcg Oral Daily     Social history Social History  Substance Use Topics  . Smoking status: Never Smoker   . Smokeless tobacco: Never Used  . Alcohol Use: 0.6 oz/week    1 Glasses of wine, 0 Cans of beer, 0 Shots of liquor per week     Comment: Socially (on weekends)     Review of systems CV: no chest pain or palpitations PULM: no cough or shortness of breath ABD: no abdominal pain or nausea or vomiting   Physical Exam BP 91/70 mmHg  Pulse 84  Temp(Src) 98.1 F (36.7 C) (Oral)  Resp 17  Ht 5\' 1"  (1.549 m)  Wt 56.8 kg (125 lb 3.5 oz)  BMI 23.67 kg/m2  SpO2 100%  GEN: thin WF, in NAD. HEENT: No proptosis, EOMI, lid lag or stare. Oropharynx is clear.  NECK: supple, trachea midline.   RESPIRATORY: clear bilaterally, no wheeze, good inspiratory effort. CV: No carotid bruits, RRR. MUSCULOSKELETAL: no clubbing, no tremor ABD: soft,  NT/ND, no organomegaly EXT: no peripheral edema SKIN: no dermatopathy or rash or acanthosis nigricans.  LYMPH: no submandibular or supraclavicular LAD NEURO: PERRL PSYC: alert and oriented, fair insight  Labs Results for orders placed or performed during the hospital encounter of 01/19/15 (from the past 24 hour(s))  Glucose, capillary     Status: Abnormal   Collection Time: 01/22/15 10:22 AM  Result Value Ref Range   Glucose-Capillary 183 (H) 65 - 99 mg/dL  Glucose, capillary     Status: Abnormal   Collection Time: 01/22/15 11:31 AM  Result Value Ref Range   Glucose-Capillary 228 (H) 65 - 99 mg/dL  Glucose, capillary     Status: Abnormal   Collection Time: 01/22/15 12:19 PM  Result Value Ref Range   Glucose-Capillary 200 (H) 65 - 99 mg/dL  Ketones, urine     Status: Abnormal   Collection Time: 01/22/15 12:55 PM  Result Value Ref Range   Ketones, ur 2+ (A) NEGATIVE mg/dL  Urinalysis complete, with microscopic ([facility] only)     Status: Abnormal   Collection Time: 01/22/15 12:55 PM  Result Value Ref Range   Color, Urine YELLOW (A) YELLOW   APPearance CLEAR (A) CLEAR   Glucose, UA >500 (A) NEGATIVE mg/dL   Bilirubin Urine NEGATIVE NEGATIVE   Ketones, ur 2+ (A) NEGATIVE mg/dL   Specific Gravity, Urine 1.011 1.005 -  1.030   Hgb urine dipstick NEGATIVE NEGATIVE   pH 5.0 5.0 - 8.0   Protein, ur NEGATIVE NEGATIVE mg/dL   Nitrite NEGATIVE NEGATIVE   Leukocytes, UA NEGATIVE NEGATIVE   RBC / HPF 0-5 0 - 5 RBC/hpf   WBC, UA 0-5 0 - 5 WBC/hpf   Bacteria, UA NONE SEEN NONE SEEN   Squamous Epithelial / LPF 0-5 (A) NONE SEEN   Mucous PRESENT   Glucose, capillary     Status: Abnormal   Collection Time: 01/22/15  1:17 PM  Result Value Ref Range   Glucose-Capillary 177 (H) 65 - 99 mg/dL  Glucose, capillary     Status: Abnormal   Collection Time: 01/22/15  2:19 PM  Result Value Ref Range   Glucose-Capillary 142 (H) 65 - 99 mg/dL  Glucose, capillary     Status: Abnormal   Collection  Time: 01/22/15  3:23 PM  Result Value Ref Range   Glucose-Capillary 118 (H) 65 - 99 mg/dL  Glucose, capillary     Status: Abnormal   Collection Time: 01/22/15  4:26 PM  Result Value Ref Range   Glucose-Capillary 108 (H) 65 - 99 mg/dL  Glucose, capillary     Status: None   Collection Time: 01/22/15  5:23 PM  Result Value Ref Range   Glucose-Capillary 71 65 - 99 mg/dL  Glucose, capillary     Status: Abnormal   Collection Time: 01/22/15  6:26 PM  Result Value Ref Range   Glucose-Capillary 126 (H) 65 - 99 mg/dL  Glucose, capillary     Status: Abnormal   Collection Time: 01/22/15  6:52 PM  Result Value Ref Range   Glucose-Capillary 140 (H) 65 - 99 mg/dL  Glucose, capillary     Status: Abnormal   Collection Time: 01/22/15  7:58 PM  Result Value Ref Range   Glucose-Capillary 189 (H) 65 - 99 mg/dL  Glucose, capillary     Status: Abnormal   Collection Time: 01/22/15  8:36 PM  Result Value Ref Range   Glucose-Capillary 247 (H) 65 - 99 mg/dL  Basic metabolic panel     Status: Abnormal   Collection Time: 01/22/15  9:56 PM  Result Value Ref Range   Sodium 131 (L) 135 - 145 mmol/L   Potassium 4.1 3.5 - 5.1 mmol/L   Chloride 107 101 - 111 mmol/L   CO2 15 (L) 22 - 32 mmol/L   Glucose, Bld 229 (H) 65 - 99 mg/dL   BUN 7 6 - 20 mg/dL   Creatinine, Ser 1.61 0.44 - 1.00 mg/dL   Calcium 7.9 (L) 8.9 - 10.3 mg/dL   GFR calc non Af Amer >60 >60 mL/min   GFR calc Af Amer >60 >60 mL/min   Anion gap 9 5 - 15  Glucose, capillary     Status: Abnormal   Collection Time: 01/22/15 10:00 PM  Result Value Ref Range   Glucose-Capillary 218 (H) 65 - 99 mg/dL  Glucose, capillary     Status: Abnormal   Collection Time: 01/22/15 10:54 PM  Result Value Ref Range   Glucose-Capillary 189 (H) 65 - 99 mg/dL  Glucose, capillary     Status: Abnormal   Collection Time: 01/23/15 12:57 AM  Result Value Ref Range   Glucose-Capillary 130 (H) 65 - 99 mg/dL  Glucose, capillary     Status: Abnormal   Collection Time:  01/23/15  1:51 AM  Result Value Ref Range   Glucose-Capillary 116 (H) 65 - 99 mg/dL  Glucose, capillary  Status: None   Collection Time: 01/23/15  2:59 AM  Result Value Ref Range   Glucose-Capillary 92 65 - 99 mg/dL  Basic metabolic panel     Status: Abnormal   Collection Time: 01/23/15  3:45 AM  Result Value Ref Range   Sodium 132 (L) 135 - 145 mmol/L   Potassium 3.9 3.5 - 5.1 mmol/L   Chloride 107 101 - 111 mmol/L   CO2 20 (L) 22 - 32 mmol/L   Glucose, Bld 98 65 - 99 mg/dL   BUN 6 6 - 20 mg/dL   Creatinine, Ser 1.61 0.44 - 1.00 mg/dL   Calcium 7.7 (L) 8.9 - 10.3 mg/dL   GFR calc non Af Amer >60 >60 mL/min   GFR calc Af Amer >60 >60 mL/min   Anion gap 5 5 - 15  Glucose, capillary     Status: Abnormal   Collection Time: 01/23/15  3:54 AM  Result Value Ref Range   Glucose-Capillary 103 (H) 65 - 99 mg/dL  Glucose, capillary     Status: Abnormal   Collection Time: 01/23/15  5:00 AM  Result Value Ref Range   Glucose-Capillary 106 (H) 65 - 99 mg/dL  Glucose, capillary     Status: Abnormal   Collection Time: 01/23/15  5:44 AM  Result Value Ref Range   Glucose-Capillary 137 (H) 65 - 99 mg/dL  Glucose, capillary     Status: Abnormal   Collection Time: 01/23/15  6:50 AM  Result Value Ref Range   Glucose-Capillary 156 (H) 65 - 99 mg/dL  Glucose, capillary     Status: Abnormal   Collection Time: 01/23/15  7:37 AM  Result Value Ref Range   Glucose-Capillary 130 (H) 65 - 99 mg/dL  Glucose, capillary     Status: Abnormal   Collection Time: 01/23/15  8:46 AM  Result Value Ref Range   Glucose-Capillary 129 (H) 65 - 99 mg/dL     Assessment Uncontrolled type 1 diabetes  Plan 1. Will restart insulin pump. Adjusted settings as she requires very minimal insulin overnight. New basal rates: Medtronic insulin pump was reviewed. Current settings: BASAL RATES: 12 am 0.2 units/hr, 9:30 am 0.275 units/hr, 10 pm 0.25 units/hr (24-hr basal = 6.975 units)  I observed Freida fill canula  tubing, and place pump on abdomen. She had good understanding and tolerated this well.  2. Discontinued IV insulin and IV D5  3. Encouraged PO intake  4. Anticipate discharge to rehab today, if Dr Allena Katz agrees.  5. Scheduled out-patient follow up with me on Friday 01/31/15 at 10:45 am.  This was a complicated visit. Time spent = 42 min.

## 2015-01-27 DIAGNOSIS — D509 Iron deficiency anemia, unspecified: Secondary | ICD-10-CM | POA: Diagnosis not present

## 2015-01-27 DIAGNOSIS — D519 Vitamin B12 deficiency anemia, unspecified: Secondary | ICD-10-CM | POA: Diagnosis not present

## 2015-01-27 DIAGNOSIS — G4489 Other headache syndrome: Secondary | ICD-10-CM | POA: Diagnosis not present

## 2015-01-27 DIAGNOSIS — E039 Hypothyroidism, unspecified: Secondary | ICD-10-CM | POA: Diagnosis not present

## 2015-01-27 DIAGNOSIS — E108 Type 1 diabetes mellitus with unspecified complications: Secondary | ICD-10-CM | POA: Diagnosis not present

## 2015-01-30 ENCOUNTER — Encounter: Payer: Self-pay | Admitting: *Deleted

## 2015-01-30 ENCOUNTER — Inpatient Hospital Stay
Admission: EM | Admit: 2015-01-30 | Discharge: 2015-03-01 | DRG: 871 | Disposition: E | Payer: Medicare Other | Attending: Internal Medicine | Admitting: Internal Medicine

## 2015-01-30 ENCOUNTER — Emergency Department: Payer: Medicare Other

## 2015-01-30 DIAGNOSIS — Z9641 Presence of insulin pump (external) (internal): Secondary | ICD-10-CM | POA: Diagnosis present

## 2015-01-30 DIAGNOSIS — J189 Pneumonia, unspecified organism: Secondary | ICD-10-CM | POA: Diagnosis not present

## 2015-01-30 DIAGNOSIS — E10649 Type 1 diabetes mellitus with hypoglycemia without coma: Secondary | ICD-10-CM | POA: Diagnosis not present

## 2015-01-30 DIAGNOSIS — E876 Hypokalemia: Secondary | ICD-10-CM | POA: Diagnosis present

## 2015-01-30 DIAGNOSIS — Z452 Encounter for adjustment and management of vascular access device: Secondary | ICD-10-CM

## 2015-01-30 DIAGNOSIS — R600 Localized edema: Secondary | ICD-10-CM | POA: Diagnosis present

## 2015-01-30 DIAGNOSIS — E109 Type 1 diabetes mellitus without complications: Secondary | ICD-10-CM | POA: Diagnosis present

## 2015-01-30 DIAGNOSIS — N183 Chronic kidney disease, stage 3 (moderate): Secondary | ICD-10-CM | POA: Diagnosis present

## 2015-01-30 DIAGNOSIS — J9600 Acute respiratory failure, unspecified whether with hypoxia or hypercapnia: Secondary | ICD-10-CM | POA: Diagnosis not present

## 2015-01-30 DIAGNOSIS — J939 Pneumothorax, unspecified: Secondary | ICD-10-CM

## 2015-01-30 DIAGNOSIS — E861 Hypovolemia: Secondary | ICD-10-CM | POA: Diagnosis not present

## 2015-01-30 DIAGNOSIS — E1110 Type 2 diabetes mellitus with ketoacidosis without coma: Secondary | ICD-10-CM | POA: Diagnosis present

## 2015-01-30 DIAGNOSIS — T380X5A Adverse effect of glucocorticoids and synthetic analogues, initial encounter: Secondary | ICD-10-CM | POA: Diagnosis not present

## 2015-01-30 DIAGNOSIS — E611 Iron deficiency: Secondary | ICD-10-CM | POA: Diagnosis present

## 2015-01-30 DIAGNOSIS — A047 Enterocolitis due to Clostridium difficile: Secondary | ICD-10-CM | POA: Diagnosis not present

## 2015-01-30 DIAGNOSIS — N189 Chronic kidney disease, unspecified: Secondary | ICD-10-CM | POA: Diagnosis not present

## 2015-01-30 DIAGNOSIS — E8779 Other fluid overload: Secondary | ICD-10-CM | POA: Diagnosis present

## 2015-01-30 DIAGNOSIS — R0602 Shortness of breath: Secondary | ICD-10-CM

## 2015-01-30 DIAGNOSIS — Z823 Family history of stroke: Secondary | ICD-10-CM | POA: Diagnosis not present

## 2015-01-30 DIAGNOSIS — R6510 Systemic inflammatory response syndrome (SIRS) of non-infectious origin without acute organ dysfunction: Secondary | ICD-10-CM | POA: Diagnosis not present

## 2015-01-30 DIAGNOSIS — S270XXA Traumatic pneumothorax, initial encounter: Secondary | ICD-10-CM | POA: Diagnosis present

## 2015-01-30 DIAGNOSIS — S2249XA Multiple fractures of ribs, unspecified side, initial encounter for closed fracture: Secondary | ICD-10-CM | POA: Diagnosis present

## 2015-01-30 DIAGNOSIS — F329 Major depressive disorder, single episode, unspecified: Secondary | ICD-10-CM | POA: Diagnosis present

## 2015-01-30 DIAGNOSIS — D649 Anemia, unspecified: Secondary | ICD-10-CM | POA: Diagnosis present

## 2015-01-30 DIAGNOSIS — R680 Hypothermia, not associated with low environmental temperature: Secondary | ICD-10-CM | POA: Diagnosis not present

## 2015-01-30 DIAGNOSIS — Z66 Do not resuscitate: Secondary | ICD-10-CM | POA: Diagnosis not present

## 2015-01-30 DIAGNOSIS — E875 Hyperkalemia: Secondary | ICD-10-CM | POA: Diagnosis not present

## 2015-01-30 DIAGNOSIS — G40909 Epilepsy, unspecified, not intractable, without status epilepticus: Secondary | ICD-10-CM | POA: Diagnosis present

## 2015-01-30 DIAGNOSIS — B370 Candidal stomatitis: Secondary | ICD-10-CM | POA: Diagnosis not present

## 2015-01-30 DIAGNOSIS — W19XXXD Unspecified fall, subsequent encounter: Secondary | ICD-10-CM | POA: Diagnosis present

## 2015-01-30 DIAGNOSIS — E1021 Type 1 diabetes mellitus with diabetic nephropathy: Secondary | ICD-10-CM | POA: Diagnosis present

## 2015-01-30 DIAGNOSIS — E1010 Type 1 diabetes mellitus with ketoacidosis without coma: Secondary | ICD-10-CM | POA: Diagnosis present

## 2015-01-30 DIAGNOSIS — E1022 Type 1 diabetes mellitus with diabetic chronic kidney disease: Secondary | ICD-10-CM | POA: Diagnosis present

## 2015-01-30 DIAGNOSIS — E871 Hypo-osmolality and hyponatremia: Secondary | ICD-10-CM | POA: Diagnosis present

## 2015-01-30 DIAGNOSIS — L89621 Pressure ulcer of left heel, stage 1: Secondary | ICD-10-CM | POA: Diagnosis not present

## 2015-01-30 DIAGNOSIS — N289 Disorder of kidney and ureter, unspecified: Secondary | ICD-10-CM | POA: Diagnosis not present

## 2015-01-30 DIAGNOSIS — R5381 Other malaise: Secondary | ICD-10-CM | POA: Diagnosis not present

## 2015-01-30 DIAGNOSIS — S2239XA Fracture of one rib, unspecified side, initial encounter for closed fracture: Secondary | ICD-10-CM | POA: Diagnosis present

## 2015-01-30 DIAGNOSIS — E039 Hypothyroidism, unspecified: Secondary | ICD-10-CM | POA: Diagnosis present

## 2015-01-30 DIAGNOSIS — L8990 Pressure ulcer of unspecified site, unspecified stage: Secondary | ICD-10-CM | POA: Diagnosis present

## 2015-01-30 DIAGNOSIS — E872 Acidosis, unspecified: Secondary | ICD-10-CM

## 2015-01-30 DIAGNOSIS — E10319 Type 1 diabetes mellitus with unspecified diabetic retinopathy without macular edema: Secondary | ICD-10-CM | POA: Diagnosis present

## 2015-01-30 DIAGNOSIS — R319 Hematuria, unspecified: Secondary | ICD-10-CM | POA: Diagnosis present

## 2015-01-30 DIAGNOSIS — N179 Acute kidney failure, unspecified: Secondary | ICD-10-CM

## 2015-01-30 DIAGNOSIS — R1110 Vomiting, unspecified: Secondary | ICD-10-CM | POA: Diagnosis not present

## 2015-01-30 DIAGNOSIS — S2242XD Multiple fractures of ribs, left side, subsequent encounter for fracture with routine healing: Secondary | ICD-10-CM

## 2015-01-30 DIAGNOSIS — E1129 Type 2 diabetes mellitus with other diabetic kidney complication: Secondary | ICD-10-CM | POA: Diagnosis not present

## 2015-01-30 DIAGNOSIS — E43 Unspecified severe protein-calorie malnutrition: Secondary | ICD-10-CM | POA: Insufficient documentation

## 2015-01-30 DIAGNOSIS — S2242XA Multiple fractures of ribs, left side, initial encounter for closed fracture: Secondary | ICD-10-CM | POA: Diagnosis not present

## 2015-01-30 DIAGNOSIS — D631 Anemia in chronic kidney disease: Secondary | ICD-10-CM | POA: Diagnosis not present

## 2015-01-30 DIAGNOSIS — R7309 Other abnormal glucose: Secondary | ICD-10-CM | POA: Diagnosis not present

## 2015-01-30 DIAGNOSIS — Y95 Nosocomial condition: Secondary | ICD-10-CM | POA: Diagnosis present

## 2015-01-30 DIAGNOSIS — J918 Pleural effusion in other conditions classified elsewhere: Secondary | ICD-10-CM | POA: Diagnosis present

## 2015-01-30 DIAGNOSIS — E785 Hyperlipidemia, unspecified: Secondary | ICD-10-CM | POA: Diagnosis present

## 2015-01-30 DIAGNOSIS — L89611 Pressure ulcer of right heel, stage 1: Secondary | ICD-10-CM | POA: Diagnosis not present

## 2015-01-30 DIAGNOSIS — E46 Unspecified protein-calorie malnutrition: Secondary | ICD-10-CM

## 2015-01-30 DIAGNOSIS — J690 Pneumonitis due to inhalation of food and vomit: Secondary | ICD-10-CM | POA: Diagnosis not present

## 2015-01-30 DIAGNOSIS — R531 Weakness: Secondary | ICD-10-CM | POA: Diagnosis not present

## 2015-01-30 DIAGNOSIS — R5383 Other fatigue: Secondary | ICD-10-CM | POA: Diagnosis not present

## 2015-01-30 DIAGNOSIS — R062 Wheezing: Secondary | ICD-10-CM

## 2015-01-30 DIAGNOSIS — E860 Dehydration: Secondary | ICD-10-CM | POA: Diagnosis not present

## 2015-01-30 DIAGNOSIS — Z794 Long term (current) use of insulin: Secondary | ICD-10-CM

## 2015-01-30 DIAGNOSIS — R6521 Severe sepsis with septic shock: Secondary | ICD-10-CM | POA: Diagnosis not present

## 2015-01-30 DIAGNOSIS — J9383 Other pneumothorax: Secondary | ICD-10-CM | POA: Diagnosis not present

## 2015-01-30 DIAGNOSIS — I129 Hypertensive chronic kidney disease with stage 1 through stage 4 chronic kidney disease, or unspecified chronic kidney disease: Secondary | ICD-10-CM | POA: Diagnosis present

## 2015-01-30 DIAGNOSIS — E162 Hypoglycemia, unspecified: Secondary | ICD-10-CM | POA: Diagnosis not present

## 2015-01-30 DIAGNOSIS — Z8249 Family history of ischemic heart disease and other diseases of the circulatory system: Secondary | ICD-10-CM | POA: Diagnosis not present

## 2015-01-30 DIAGNOSIS — A419 Sepsis, unspecified organism: Secondary | ICD-10-CM | POA: Diagnosis not present

## 2015-01-30 DIAGNOSIS — J9811 Atelectasis: Secondary | ICD-10-CM | POA: Diagnosis not present

## 2015-01-30 DIAGNOSIS — R29898 Other symptoms and signs involving the musculoskeletal system: Secondary | ICD-10-CM | POA: Diagnosis not present

## 2015-01-30 DIAGNOSIS — J90 Pleural effusion, not elsewhere classified: Secondary | ICD-10-CM | POA: Diagnosis not present

## 2015-01-30 DIAGNOSIS — E031 Congenital hypothyroidism without goiter: Secondary | ICD-10-CM | POA: Diagnosis not present

## 2015-01-30 DIAGNOSIS — R509 Fever, unspecified: Secondary | ICD-10-CM | POA: Diagnosis not present

## 2015-01-30 LAB — GLUCOSE, CAPILLARY
GLUCOSE-CAPILLARY: 223 mg/dL — AB (ref 65–99)
GLUCOSE-CAPILLARY: 150 mg/dL — AB (ref 65–99)
GLUCOSE-CAPILLARY: 139 mg/dL — AB (ref 65–99)
GLUCOSE-CAPILLARY: 108 mg/dL — AB (ref 65–99)
GLUCOSE-CAPILLARY: 141 mg/dL — AB (ref 65–99)
GLUCOSE-CAPILLARY: 154 mg/dL — AB (ref 65–99)
GLUCOSE-CAPILLARY: 204 mg/dL — AB (ref 65–99)
GLUCOSE-CAPILLARY: 205 mg/dL — AB (ref 65–99)
GLUCOSE-CAPILLARY: 154 mg/dL — AB (ref 65–99)
Glucose-Capillary: 225 mg/dL — ABNORMAL HIGH (ref 65–99)
Glucose-Capillary: 174 mg/dL — ABNORMAL HIGH (ref 65–99)
Glucose-Capillary: 122 mg/dL — ABNORMAL HIGH (ref 65–99)
Glucose-Capillary: 137 mg/dL — ABNORMAL HIGH (ref 65–99)
Glucose-Capillary: 150 mg/dL — ABNORMAL HIGH (ref 65–99)
Glucose-Capillary: 186 mg/dL — ABNORMAL HIGH (ref 65–99)
Glucose-Capillary: 172 mg/dL — ABNORMAL HIGH (ref 65–99)
Glucose-Capillary: 191 mg/dL — ABNORMAL HIGH (ref 65–99)

## 2015-01-30 LAB — BASIC METABOLIC PANEL
ANION GAP: 9 (ref 5–15)
Anion gap: 10 (ref 5–15)
Anion gap: 8 (ref 5–15)
Anion gap: 7 (ref 5–15)
BUN: 14 mg/dL (ref 6–20)
BUN: 11 mg/dL (ref 6–20)
BUN: 11 mg/dL (ref 6–20)
BUN: 11 mg/dL (ref 6–20)
CALCIUM: 7.9 mg/dL — AB (ref 8.9–10.3)
CALCIUM: 7.9 mg/dL — AB (ref 8.9–10.3)
CALCIUM: 7.7 mg/dL — AB (ref 8.9–10.3)
CALCIUM: 7.3 mg/dL — AB (ref 8.9–10.3)
CHLORIDE: 102 mmol/L (ref 101–111)
CHLORIDE: 102 mmol/L (ref 101–111)
CO2: 16 mmol/L — ABNORMAL LOW (ref 22–32)
CO2: 17 mmol/L — ABNORMAL LOW (ref 22–32)
CO2: 16 mmol/L — AB (ref 22–32)
CO2: 17 mmol/L — ABNORMAL LOW (ref 22–32)
CREATININE: 1.4 mg/dL — AB (ref 0.44–1.00)
CREATININE: 1.28 mg/dL — AB (ref 0.44–1.00)
CREATININE: 1.53 mg/dL — AB (ref 0.44–1.00)
Chloride: 103 mmol/L (ref 101–111)
Chloride: 103 mmol/L (ref 101–111)
Creatinine, Ser: 1.37 mg/dL — ABNORMAL HIGH (ref 0.44–1.00)
GFR calc non Af Amer: 42 mL/min — ABNORMAL LOW (ref >60)
GFR calc non Af Amer: 39 mL/min — ABNORMAL LOW (ref >60)
GFR, EST AFRICAN AMERICAN: 44 mL/min — AB (ref >60)
GFR, EST AFRICAN AMERICAN: 49 mL/min — AB (ref >60)
GFR, EST AFRICAN AMERICAN: 45 mL/min — AB (ref >60)
GFR, EST AFRICAN AMERICAN: 39 mL/min — AB (ref >60)
GFR, EST NON AFRICAN AMERICAN: 38 mL/min — AB (ref >60)
GFR, EST NON AFRICAN AMERICAN: 34 mL/min — AB (ref >60)
GLUCOSE: 199 mg/dL — AB (ref 65–99)
Glucose, Bld: 198 mg/dL — ABNORMAL HIGH (ref 65–99)
Glucose, Bld: 147 mg/dL — ABNORMAL HIGH (ref 65–99)
Glucose, Bld: 204 mg/dL — ABNORMAL HIGH (ref 65–99)
POTASSIUM: 3.8 mmol/L (ref 3.5–5.1)
Potassium: 3.6 mmol/L (ref 3.5–5.1)
Potassium: 3.6 mmol/L (ref 3.5–5.1)
Potassium: 3.5 mmol/L (ref 3.5–5.1)
SODIUM: 129 mmol/L — AB (ref 135–145)
SODIUM: 128 mmol/L — AB (ref 135–145)
SODIUM: 126 mmol/L — AB (ref 135–145)
Sodium: 127 mmol/L — ABNORMAL LOW (ref 135–145)

## 2015-01-30 LAB — URINALYSIS COMPLETE WITH MICROSCOPIC (ARMC ONLY)
BACTERIA UA: NONE SEEN
BILIRUBIN URINE: NEGATIVE
Glucose, UA: >500 mg/dL — AB
Leukocytes, UA: NEGATIVE
Nitrite: NEGATIVE
PH: 5 (ref 5.0–8.0)
PROTEIN: NEGATIVE mg/dL
Specific Gravity, Urine: 1.012 (ref 1.005–1.030)

## 2015-01-30 LAB — COMPREHENSIVE METABOLIC PANEL
ALBUMIN: 3.1 g/dL — AB (ref 3.5–5.0)
ALK PHOS: 155 U/L — AB (ref 38–126)
ALT: 13 U/L — ABNORMAL LOW (ref 14–54)
AST: 31 U/L (ref 15–41)
Anion gap: 18 — ABNORMAL HIGH (ref 5–15)
BILIRUBIN TOTAL: 1.2 mg/dL (ref 0.3–1.2)
BUN: 17 mg/dL (ref 6–20)
CALCIUM: 8.9 mg/dL (ref 8.9–10.3)
CO2: 14 mmol/L — ABNORMAL LOW (ref 22–32)
CREATININE: 1.99 mg/dL — AB (ref 0.44–1.00)
Chloride: 94 mmol/L — ABNORMAL LOW (ref 101–111)
GFR calc Af Amer: 29 mL/min — ABNORMAL LOW (ref >60)
GFR, EST NON AFRICAN AMERICAN: 25 mL/min — AB (ref >60)
GLUCOSE: 312 mg/dL — AB (ref 65–99)
POTASSIUM: 3.9 mmol/L (ref 3.5–5.1)
Sodium: 126 mmol/L — ABNORMAL LOW (ref 135–145)
TOTAL PROTEIN: 6.8 g/dL (ref 6.5–8.1)

## 2015-01-30 LAB — CBC WITH DIFFERENTIAL/PLATELET
BASOS ABS: 0.1 10*3/uL (ref 0–0.1)
Basophils Relative: 1 %
Eosinophils Absolute: 0.8 10*3/uL — ABNORMAL HIGH (ref 0–0.7)
Eosinophils Relative: 6 %
HEMATOCRIT: 34.5 % — AB (ref 35.0–47.0)
HEMOGLOBIN: 11.2 g/dL — AB (ref 12.0–16.0)
LYMPHS PCT: 11 %
Lymphs Abs: 1.5 10*3/uL (ref 1.0–3.6)
MCH: 27.9 pg (ref 26.0–34.0)
MCHC: 32.4 g/dL (ref 32.0–36.0)
MCV: 86.1 fL (ref 80.0–100.0)
MONO ABS: 0.8 10*3/uL (ref 0.2–0.9)
MONOS PCT: 6 %
NEUTROS ABS: 10 10*3/uL — AB (ref 1.4–6.5)
NEUTROS PCT: 76 %
Platelets: 356 10*3/uL (ref 150–440)
RBC: 4 MIL/uL (ref 3.80–5.20)
RDW: 14.3 % (ref 11.5–14.5)
WBC: 13.1 10*3/uL — ABNORMAL HIGH (ref 3.6–11.0)

## 2015-01-30 LAB — MAGNESIUM: MAGNESIUM: 1.6 mg/dL — AB (ref 1.7–2.4)

## 2015-01-30 LAB — LACTIC ACID, PLASMA
Lactic Acid, Venous: 2.5 mmol/L (ref 0.5–2.0)
Lactic Acid, Venous: 2.1 mmol/L (ref 0.5–2.0)

## 2015-01-30 LAB — PHOSPHORUS: PHOSPHORUS: 2.1 mg/dL — AB (ref 2.5–4.6)

## 2015-01-30 LAB — TROPONIN I: TROPONIN I: 0.05 ng/mL — AB (ref <0.031)

## 2015-01-30 LAB — MRSA PCR SCREENING: MRSA by PCR: NEGATIVE

## 2015-01-30 LAB — HEMOGLOBIN A1C: Hgb A1c MFr Bld: 9.9 % — ABNORMAL HIGH (ref 4.0–6.0)

## 2015-01-30 MED ORDER — NOREPINEPHRINE 4 MG/250ML-% IV SOLN
0.0000 ug/min | INTRAVENOUS | Status: DC
  Administered 2015-01-31: 2 ug/min via INTRAVENOUS
  Administered 2015-01-31: 8 ug/min via INTRAVENOUS
  Administered 2015-01-31: 7 ug/min via INTRAVENOUS
  Administered 2015-02-01: 8 ug/min via INTRAVENOUS
  Administered 2015-02-05: 2 ug/min via INTRAVENOUS
  Filled 2015-01-30 (×8): qty 250

## 2015-01-30 MED ORDER — ONDANSETRON HCL 4 MG/2ML IJ SOLN
4.0000 mg | Freq: Once | INTRAMUSCULAR | Status: AC
  Administered 2015-01-30: 4 mg via INTRAVENOUS

## 2015-01-30 MED ORDER — ACETAMINOPHEN 325 MG PO TABS
650.0000 mg | ORAL_TABLET | Freq: Four times a day (QID) | ORAL | Status: DC | PRN
  Administered 2015-02-09 – 2015-02-11 (×5): 650 mg via ORAL
  Filled 2015-01-30 (×5): qty 2

## 2015-01-30 MED ORDER — LEVOTHYROXINE SODIUM 100 MCG PO TABS
100.0000 ug | ORAL_TABLET | Freq: Every day | ORAL | Status: DC
  Administered 2015-01-30 – 2015-01-31 (×2): 100 ug via ORAL
  Filled 2015-01-30 (×2): qty 1

## 2015-01-30 MED ORDER — MAGNESIUM SULFATE 2 GM/50ML IV SOLN
2.0000 g | Freq: Once | INTRAVENOUS | Status: AC
  Administered 2015-01-30: 2 g via INTRAVENOUS
  Filled 2015-01-30: qty 50

## 2015-01-30 MED ORDER — SODIUM CHLORIDE 0.9 % IV SOLN
Freq: Once | INTRAVENOUS | Status: AC
  Administered 2015-01-30: 03:00:00 via INTRAVENOUS

## 2015-01-30 MED ORDER — HEPARIN SODIUM (PORCINE) 5000 UNIT/ML IJ SOLN
5000.0000 [IU] | Freq: Three times a day (TID) | INTRAMUSCULAR | Status: DC
  Administered 2015-01-30 – 2015-02-04 (×16): 5000 [IU] via SUBCUTANEOUS
  Filled 2015-01-30 (×17): qty 1

## 2015-01-30 MED ORDER — SODIUM CHLORIDE 0.9 % IJ SOLN
3.0000 mL | Freq: Two times a day (BID) | INTRAMUSCULAR | Status: DC
  Administered 2015-01-30 – 2015-02-06 (×12): 3 mL via INTRAVENOUS
  Administered 2015-02-06: 20:00:00 via INTRAVENOUS
  Administered 2015-02-06 – 2015-02-15 (×16): 3 mL via INTRAVENOUS

## 2015-01-30 MED ORDER — PIPERACILLIN-TAZOBACTAM 3.375 G IVPB
3.3750 g | Freq: Three times a day (TID) | INTRAVENOUS | Status: DC
  Administered 2015-01-30 – 2015-02-05 (×19): 3.375 g via INTRAVENOUS
  Filled 2015-01-30 (×24): qty 50

## 2015-01-30 MED ORDER — DEXTROSE 5 % IV SOLN: 0.0000 ug/min | INTRAVENOUS | Status: DC

## 2015-01-30 MED ORDER — VANCOMYCIN HCL IN DEXTROSE 1-5 GM/200ML-% IV SOLN
1000.0000 mg | Freq: Once | INTRAVENOUS | Status: AC
  Administered 2015-01-30: 1000 mg via INTRAVENOUS
  Filled 2015-01-30: qty 200

## 2015-01-30 MED ORDER — POTASSIUM CHLORIDE 10 MEQ/100ML IV SOLN
10.0000 meq | INTRAVENOUS | Status: AC
  Administered 2015-01-30: 10 meq via INTRAVENOUS
  Filled 2015-01-30 (×2): qty 100

## 2015-01-30 MED ORDER — ONDANSETRON HCL 4 MG/2ML IJ SOLN
INTRAMUSCULAR | Status: AC
  Administered 2015-01-30: 04:00:00
  Filled 2015-01-30: qty 2

## 2015-01-30 MED ORDER — VANCOMYCIN HCL IN DEXTROSE 750-5 MG/150ML-% IV SOLN: 750.0000 mg | INTRAVENOUS | Status: DC

## 2015-01-30 MED ORDER — SODIUM CHLORIDE 0.9 % IV BOLUS (SEPSIS)
1000.0000 mL | Freq: Once | INTRAVENOUS | Status: AC
  Administered 2015-01-30: 1000 mL via INTRAVENOUS

## 2015-01-30 MED ORDER — DEXTROSE-NACL 5-0.45 % IV SOLN
INTRAVENOUS | Status: DC
  Administered 2015-01-30: 15:00:00 via INTRAVENOUS

## 2015-01-30 MED ORDER — TRAMADOL HCL 50 MG PO TABS
50.0000 mg | ORAL_TABLET | Freq: Two times a day (BID) | ORAL | Status: DC | PRN
  Administered 2015-01-30: 50 mg via ORAL
  Filled 2015-01-30: qty 1

## 2015-01-30 MED ORDER — SODIUM CHLORIDE 0.9 % IV SOLN: INTRAVENOUS | Status: DC

## 2015-01-30 MED ORDER — OFLOXACIN 0.3 % OP SOLN
1.0000 [drp] | Freq: Four times a day (QID) | OPHTHALMIC | Status: DC
  Administered 2015-01-30 – 2015-02-11 (×48): 1 [drp] via OPHTHALMIC
  Filled 2015-01-30 (×3): qty 5

## 2015-01-30 MED ORDER — SODIUM CHLORIDE 0.9 % IV BOLUS (SEPSIS)
1000.0000 mL | INTRAVENOUS | Status: DC | PRN
  Administered 2015-01-30: 1000 mL via INTRAVENOUS
  Administered 2015-02-09: 13:00:00 via INTRAVENOUS
  Filled 2015-01-30: qty 1000

## 2015-01-30 MED ORDER — SODIUM CHLORIDE 0.9 % IV BOLUS (SEPSIS)
1000.0000 mL | INTRAVENOUS | Status: DC | PRN
  Administered 2015-01-30: 1000 mL via INTRAVENOUS
  Filled 2015-01-30: qty 1000

## 2015-01-30 MED ORDER — SODIUM CHLORIDE 0.9 % IV SOLN
INTRAVENOUS | Status: DC
  Administered 2015-01-30: 0.5 [IU] via INTRAVENOUS
  Administered 2015-01-31 (×2): 0.5 [IU]/h via INTRAVENOUS
  Administered 2015-01-31: 2 [IU]/h via INTRAVENOUS
  Administered 2015-02-02: 04:00:00 via INTRAVENOUS
  Administered 2015-02-04 (×2): 1.5 [IU]/h via INTRAVENOUS
  Administered 2015-02-05: 1.3 [IU]/h via INTRAVENOUS
  Administered 2015-02-06: 2.7 [IU]/h via INTRAVENOUS
  Filled 2015-01-30 (×5): qty 2.5

## 2015-01-30 MED ORDER — ONDANSETRON HCL 4 MG/2ML IJ SOLN
4.0000 mg | Freq: Four times a day (QID) | INTRAMUSCULAR | Status: DC | PRN
Start: 2015-01-30 — End: 2015-02-16
  Administered 2015-01-31 – 2015-02-14 (×4): 4 mg via INTRAVENOUS
  Filled 2015-01-30 (×4): qty 2

## 2015-01-30 MED ORDER — ACETAMINOPHEN 650 MG RE SUPP: 650.0000 mg | Freq: Four times a day (QID) | RECTAL | Status: DC | PRN

## 2015-01-30 MED ORDER — VANCOMYCIN HCL IN DEXTROSE 750-5 MG/150ML-% IV SOLN
750.0000 mg | INTRAVENOUS | Status: DC
Start: 2015-01-31 — End: 2015-02-05
  Administered 2015-01-31 – 2015-02-05 (×6): 750 mg via INTRAVENOUS
  Filled 2015-01-30 (×7): qty 150

## 2015-01-30 MED ORDER — ONDANSETRON HCL 4 MG PO TABS: 4.0000 mg | ORAL_TABLET | Freq: Four times a day (QID) | ORAL | Status: DC | PRN

## 2015-01-30 MED ORDER — DEXTROSE-NACL 5-0.9 % IV SOLN
INTRAVENOUS | Status: DC
  Administered 2015-01-30 – 2015-02-08 (×10): via INTRAVENOUS

## 2015-01-30 MED ORDER — PIPERACILLIN-TAZOBACTAM 3.375 G IVPB
3.3750 g | Freq: Once | INTRAVENOUS | Status: DC
  Administered 2015-01-30: 3.375 g via INTRAVENOUS

## 2015-01-30 MED ORDER — POTASSIUM PHOSPHATES 15 MMOLE/5ML IV SOLN
20.0000 mmol | Freq: Once | INTRAVENOUS | Status: AC
  Administered 2015-01-30: 20 mmol via INTRAVENOUS
  Filled 2015-01-30: qty 6.67

## 2015-01-30 MED ORDER — CETYLPYRIDINIUM CHLORIDE 0.05 % MT LIQD
7.0000 mL | Freq: Two times a day (BID) | OROMUCOSAL | Status: DC
  Administered 2015-01-30 – 2015-02-15 (×32): 7 mL via OROMUCOSAL

## 2015-01-30 MED ORDER — DEXTROSE-NACL 5-0.45 % IV SOLN
INTRAVENOUS | Status: AC
  Administered 2015-01-30: 04:00:00 via INTRAVENOUS

## 2015-01-30 NOTE — Progress Notes (Signed)
Spoke with Dr. Anne Hahn about Pt's low BP. He reviewed the Pt's chart and new orders given. Will continue to monitor and will check back with Dr. Anne Hahn.

## 2015-01-30 NOTE — Progress Notes (Signed)
RN notified Dr. Allena Katz that patient is more drowsy than earlier in shift, but remains alert to self and place and arousable to voice. RN also made MD aware that patient has not voided this shift and patient was bladder scanned and resulted 640cc. Dr. Allena Katz stated "get a blood gas and insert a foley for urinary retention."

## 2015-01-30 NOTE — Progress Notes (Signed)
Inpatient Diabetes Program Recommendations  AACE/ADA: New Consensus Statement on Inpatient Glycemic Control (2013)  Target Ranges:  Prepandial:   less than 140 mg/dL      Peak postprandial:   less than 180 mg/dL (1-2 hours)      Critically ill patients:  140 - 180 mg/dL   Reason ffor review: DKA     Patient was discharged from [place] [facility] last week after an admission and diagnosis of DKA.  Patient saw Dr. Tedd Sias while she was inpatient. Patient is very sensitive to insulin- MD note states that 1unit of insulin decreases blood sugar by /dl.   Consider having Dr. Tedd Sias consulted on this case. Spoke to [employer]. Regarding concerns about hypoglycemia and insulin sensitivity and my request for an endocrinology consult.    Susette Racer, RN, BA, MHA, CDE Diabetes Coordinator Inpatient Diabetes Program  [phone] (Team Pager) [phone] [facility] Office) February 24, 2015 7:58 AM

## 2015-01-30 NOTE — Progress Notes (Signed)
Increasingly drowsy during shift. Arouses to voice and alert to self and place. Dr. Allena Katz aware of increasing drowsiness and lethargy and ABG obtained this afternoon. o2 sats 99-100% on RA with no respiratory distress. NSR. Blood pressure has stayed low throughout shift and Dr. Allena Katz aware and gave no new orders, therefore continuing to monitor. Foley placed for urinary retention. Afebrile and patient remains on insulin drip. Daughters and husband visited this afternoon.

## 2015-01-30 NOTE — ED Notes (Signed)
Per EMS report, pt arrives via ems from [facility] with c/o weakness. At facility, the pt bp was 60/30, en route 75/48, pt with weak radial pulses and poor skin turgor. Pt has on her own insulin pump. Pt states she has not been feeling well for a couple of days.

## 2015-01-30 NOTE — Progress Notes (Signed)
Pharmacy Consult for Electrolyte Management   No Known Allergies  Patient Measurements: Height:  (154.9 cm) Weight: 119 lb 11.4 oz (54.3 kg) IBW/kg (Calculated) : 47.8  Vital Signs: Temp: 98.4 F (36.9 C) (09/01 0800) Temp Source: Oral (09/01 0800) BP: 88/50 mmHg (09/01 1200) Pulse Rate: 97 (09/01 1200) Intake/Output from previous day: 08/31 0701 - 09/01 0700 In: 2285.5 [I.V.:1885.5; IV Piggyback:400] Out: -  Intake/Output from this shift: Total I/O In: 405.4 [I.V.:405.4] Out: -   Labs:  Recent Labs  02/26/2015 0214  WBC 13.1*  HGB 11.2*  HCT 34.5*  PLT 356     Recent Labs  02/19/2015 0214 02/26/2015 0715  NA 126* 129*  K 3.9 3.6  CL 94* 103  CO2 14* 16*  GLUCOSE 312* 198*  BUN 17 14  CREATININE 1.99* 1.40*  CALCIUM 8.9 7.9*  MG 1.6*  --   PHOS 2.1*  --   PROT 6.8  --   ALBUMIN 3.1*  --   AST 31  --   ALT 13*  --   ALKPHOS 155*  --   BILITOT 1.2  --    Estimated Creatinine Clearance: 29.4 mL/min (by C-G formula based on Cr of 1.4).    Recent Labs  02/26/2015 1001 02/28/2015 1122 02/06/2015 1223  GLUCAP 122* 108* 141*    Medical History: Past Medical History  Diagnosis Date  . Diabetes mellitus type 1     (prior saw Dr. Lodema Hong endo, would like to establish locally)  . Generalized headaches     frequent  . Hypothyroidism   . Convulsions/seizures     unknown type, nml EEG  . History of chicken pox   . Anemia   . Vitamin B12 deficiency 03/2012    normal IF    Medications:  Scheduled:  . antiseptic oral rinse  7 mL Mouth Rinse BID  . heparin  5,000 Units Subcutaneous 3 times per day  . levothyroxine  100 mcg Oral QAC breakfast  . magnesium sulfate 1 - 4 g bolus IVPB  2 g Intravenous Once  . piperacillin-tazobactam (ZOSYN)  IV  3.375 g Intravenous 3 times per day  . potassium phosphate IVPB (mmol)  20 mmol Intravenous Once  . sodium chloride  3 mL Intravenous Q12H  . [START ON 01/31/2015] vancomycin  750 mg Intravenous Q24H    Infusions:  . dextrose 5 % and 0.45% NaCl 100 mL/hr at 02/26/2015 0429  . insulin (NOVOLIN-R) infusion 0 Units/hr (02/28/2015 1123)   PRN: acetaminophen **OR** acetaminophen, ondansetron **OR** ondansetron (ZOFRAN) IV  Assessment: Phamracy consulted to assist in managing electrolytes in this 69 y/o F with DKA. Mag and phos are both low today.   Plan:  Will order magnesium sulfate 2 g iv once and potassium phosphate 20 mmol iv once and f/u am labs.   [name] D 02/03/2015,12:51 PM

## 2015-01-30 NOTE — Consult Note (Signed)
ENDOCRINOLOGY CONSULTATION  REFERRING PHYSICIAN: Auburn Bilberry, MD CONSULTING PHYSICIAN:  Doylene Canning, MD  CHIEF COMPLAINT: Type 1 DM on insulin pump  HISTORY OF PRESENT ILLNESS: (obtained from the EMR as patient was somnolent and not responding to questions) 69 y.o. female seen in consultation for diabetes mellitus, type 1 admitted with hypotension and DKA overnight. She was discharged just last week after hospitalization for fall and broken ribs. She was discharged to [facility]. During her last hospitalization, she was seen by my colleague Dr. Tedd Sias who helped transition her back to insulin pump therapy. Endocrinology is now being consulted after readmission at the request of the Diabetes Coordinator due to concern for hypoglycemia and severe insulin sensitivity. Apparently this has been an issue to the point where the patient has turned off her insulin pump to avoid hypoglycemia. Dr. Tedd Sias has followed her in the outpatient setting but Ms. Mings has missed a recent appointment due to hospitalization.   Upon this admission, she was hypotensive with SBP in the 80's and tachycardic. She also had an anion gap of 18. Lactic acid was 2.5. At the nursing facility she was reportedly quite lethargic and was not taking PO due to oral thrush. She was admiitted to ICU for DKA and dehydration, has been receiving IV insulin, IVF, potassium repletion, and antibiotics. Her anion gap has closed to 10 as of this morning.  Of note, she has primary hypothyroidism and is on thyroid hormone replacement with 100 mcg levothyroxine daily. Most recent TSH level was low-normal.    MEDICAL HISTORY: Diabetes mellitus type 1      A. diagnosis in 1990s      B. mild diabetic retinopathy, last eye exam 09/27/12      C. hypoglycemic seizure 04/2013      D. Recurrent DKA Hypothyroidism  SURGICAL HISTORY: None  CURRENT MEDICATIONS:  . antiseptic oral rinse  7 mL Mouth Rinse BID  . heparin  5,000 Units  Subcutaneous 3 times per day  . levothyroxine  100 mcg Oral QAC breakfast  . piperacillin-tazobactam (ZOSYN)  IV  3.375 g Intravenous 3 times per day  . sodium chloride  3 mL Intravenous Q12H  . [START ON 01/31/2015] vancomycin  750 mg Intravenous Q36H     SOCIAL HISTORY:  Social History  Substance Use Topics  . Smoking status: Never Smoker   . Smokeless tobacco: Never Used  . Alcohol Use: 0.6 oz/week    1 Glasses of wine, 0 Cans of beer, 0 Shots of liquor per week     Comment: Socially (on weekends)     FAMILY HISTORY:   Family History  Problem Relation Age of Onset  . Stroke Mother   . Hypertension Mother   . Hypertension Father   . Coronary artery disease Father 27    MI  . Hypertension Sister   . Diabetes Cousin     type 1  . Cancer Maternal Aunt     brain  . Hyperlipidemia Brother      ALLERGIES:  No Known Allergies  REVIEW OF SYSTEMS:  GENERAL:  No weight loss.  No fever.  HEENT: Had blurred vision yesterday, now resolved. No sore throat.  NECK:  No neck pain or dysphagia.  CARDIAC:  No chest pain or palpitation.  PULMONARY:  No cough or shortness of breath.  ABDOMEN:  No abdominal pain.  No constipation. No appetite. EXTREMITIES:  No lower extremity swelling.  ENDOCRINE:  No heat or cold intolerance.  GENITOURINARY:  No dysuria  or hematuria. SKIN:  No recent rash or skin changes.   PHYSICAL EXAMINATION:  BP 95/46 mmHg  Pulse 97  Temp(Src) 98.4 F (36.9 C) (Oral)  Resp 22  Ht 5\' 1"  (1.549 m)  Wt 54.3 kg (119 lb 11.4 oz)  BMI 22.63 kg/m2  SpO2 100%  GENERAL:  Frail thin white  female in NAD. HEENT:  Dry mucus in both eyes  NECK:  Supple.  No thyromegaly.   CARDIAC:  Regular rate and rhythm without murmur.  PULMONARY:  Clear to auscultation anteriorly.  ABDOMEN:  Diffusely soft, nontender, nondistended, normoactive bowel sounds.  EXTREMITIES:  No peripheral edema is present, 2+ pedal pulses bilaterally, no ulcerations.    SKIN:  Warm,  dry. NEUROLOGIC:  Somnolent, attempts to verbalize but falls back to sleep PSYCHIATRIC:  Could not assess  LABORATORY DATA:  01/17/15 -- Hgb A1c 10.7%  Results for orders placed or performed during the hospital encounter of 02/12/2015 (from the past 24 hour(s))  CBC with Differential     Status: Abnormal   Collection Time: 02/26/2015  2:14 AM  Result Value Ref Range   WBC 13.1 (H) 3.6 - 11.0 K/uL   RBC 4.00 3.80 - 5.20 MIL/uL   Hemoglobin 11.2 (L) 12.0 - 16.0 g/dL   HCT 16.1 (L) 09.6 - 04.5 %   MCV 86.1 80.0 - 100.0 fL   MCH 27.9 26.0 - 34.0 pg   MCHC 32.4 32.0 - 36.0 g/dL   RDW 40.9 81.1 - 91.4 %   Platelets 356 150 - 440 K/uL   Neutrophils Relative % 76 %   Neutro Abs 10.0 (H) 1.4 - 6.5 K/uL   Lymphocytes Relative 11 %   Lymphs Abs 1.5 1.0 - 3.6 K/uL   Monocytes Relative 6 %   Monocytes Absolute 0.8 0.2 - 0.9 K/uL   Eosinophils Relative 6 %   Eosinophils Absolute 0.8 (H) 0 - 0.7 K/uL   Basophils Relative 1 %   Basophils Absolute 0.1 0 - 0.1 K/uL  Comprehensive metabolic panel     Status: Abnormal   Collection Time: 02/25/2015  2:14 AM  Result Value Ref Range   Sodium 126 (L) 135 - 145 mmol/L   Potassium 3.9 3.5 - 5.1 mmol/L   Chloride 94 (L) 101 - 111 mmol/L   CO2 14 (L) 22 - 32 mmol/L   Glucose, Bld 312 (H) 65 - 99 mg/dL   BUN 17 6 - 20 mg/dL   Creatinine, Ser 7.82 (H) 0.44 - 1.00 mg/dL   Calcium 8.9 8.9 - 95.6 mg/dL   Total Protein 6.8 6.5 - 8.1 g/dL   Albumin 3.1 (L) 3.5 - 5.0 g/dL   AST 31 15 - 41 U/L   ALT 13 (L) 14 - 54 U/L   Alkaline Phosphatase 155 (H) 38 - 126 U/L   Total Bilirubin 1.2 0.3 - 1.2 mg/dL   GFR calc non Af Amer 25 (L) >60 mL/min   GFR calc Af Amer 29 (L) >60 mL/min   Anion gap 18 (H) 5 - 15  Troponin I     Status: Abnormal   Collection Time: 02/24/2015  2:14 AM  Result Value Ref Range   Troponin I 0.05 (H) <0.031 ng/mL  Lactic acid, plasma     Status: Abnormal   Collection Time: 02/06/2015  2:14 AM  Result Value Ref Range   Lactic Acid, Venous 2.5  (HH) 0.5 - 2.0 mmol/L  Magnesium     Status: Abnormal   Collection Time: 02/28/2015  2:14 AM  Result Value Ref Range   Magnesium 1.6 (L) 1.7 - 2.4 mg/dL  Phosphorus     Status: Abnormal   Collection Time: 02-11-2015  2:14 AM  Result Value Ref Range   Phosphorus 2.1 (L) 2.5 - 4.6 mg/dL  Urinalysis complete, with microscopic ([facility] only)     Status: Abnormal   Collection Time: 2015-02-11  2:44 AM  Result Value Ref Range   Color, Urine YELLOW (A) YELLOW   APPearance CLEAR (A) CLEAR   Glucose, UA >500 (A) NEGATIVE mg/dL   Bilirubin Urine NEGATIVE NEGATIVE   Ketones, ur 2+ (A) NEGATIVE mg/dL   Specific Gravity, Urine 1.012 1.005 - 1.030   Hgb urine dipstick 1+ (A) NEGATIVE   pH 5.0 5.0 - 8.0   Protein, ur NEGATIVE NEGATIVE mg/dL   Nitrite NEGATIVE NEGATIVE   Leukocytes, UA NEGATIVE NEGATIVE   RBC / HPF 0-5 0 - 5 RBC/hpf   WBC, UA 0-5 0 - 5 WBC/hpf   Bacteria, UA NONE SEEN NONE SEEN   Squamous Epithelial / LPF 0-5 (A) NONE SEEN   Mucous PRESENT    Hyaline Casts, UA PRESENT   Culture, blood (routine x 2)     Status: None (Preliminary result)   Collection Time: 2015/02/11  3:34 AM  Result Value Ref Range   Specimen Description BLOOD BLOOD RIGHT FOREARM    Special Requests BOTTLES DRAWN AEROBIC AND ANAEROBIC    Culture NO GROWTH < 12 HOURS    Report Status PENDING   Culture, blood (routine x 2)     Status: None (Preliminary result)   Collection Time: 02/11/2015  3:34 AM  Result Value Ref Range   Specimen Description BLOOD RIGHT HAND    Special Requests BOTTLES DRAWN AEROBIC AND ANAEROBIC    Culture NO GROWTH < 12 HOURS    Report Status PENDING   MRSA PCR Screening     Status: None   Collection Time: 02-11-2015  5:15 AM  Result Value Ref Range   MRSA by PCR NEGATIVE NEGATIVE  Glucose, capillary     Status: Abnormal   Collection Time: February 11, 2015  6:02 AM  Result Value Ref Range   Glucose-Capillary 225 (H) 65 - 99 mg/dL   Comment 1 Notify RN   Glucose, capillary     Status: Abnormal    Collection Time: 02-11-2015  6:51 AM  Result Value Ref Range   Glucose-Capillary 174 (H) 65 - 99 mg/dL   Comment 1 Notify RN   Lactic acid, plasma     Status: Abnormal   Collection Time: 02/11/15  7:15 AM  Result Value Ref Range   Lactic Acid, Venous 2.1 (HH) 0.5 - 2.0 mmol/L  Basic metabolic panel (stat then every 4 hours)     Status: Abnormal   Collection Time: February 11, 2015  7:15 AM  Result Value Ref Range   Sodium 129 (L) 135 - 145 mmol/L   Potassium 3.6 3.5 - 5.1 mmol/L   Chloride 103 101 - 111 mmol/L   CO2 16 (L) 22 - 32 mmol/L   Glucose, Bld 198 (H) 65 - 99 mg/dL   BUN 14 6 - 20 mg/dL   Creatinine, Ser 1.61 (H) 0.44 - 1.00 mg/dL   Calcium 7.9 (L) 8.9 - 10.3 mg/dL   GFR calc non Af Amer 38 (L) >60 mL/min   GFR calc Af Amer 44 (L) >60 mL/min   Anion gap 10 5 - 15  Glucose, capillary     Status: Abnormal  Collection Time: 02/03/2015  8:06 AM  Result Value Ref Range   Glucose-Capillary 150 (H) 65 - 99 mg/dL  Glucose, capillary     Status: Abnormal   Collection Time: 02/06/2015  9:04 AM  Result Value Ref Range   Glucose-Capillary 139 (H) 65 - 99 mg/dL  Glucose, capillary     Status: Abnormal   Collection Time: 02/03/2015 10:01 AM  Result Value Ref Range   Glucose-Capillary 122 (H) 65 - 99 mg/dL    ASSESSMENT:  1. DKA - resolved 2. Uncontrolled type 1 diabetes on insulin pump 2. Acute renal failure due to dehydration  3. Hypothyroidism, stable   PLAN: 1. Given her altered mental status, would not recommend transition back to her insulin pump at this point 2. Would continue IV insulin infusion with hourly glucometer checks 3. Once BG is >140, resume IV insulin per DKA insulin drip protocol and continue IV D5W 1/2 NS. Goal BG 140-180. 4. Please alert family to bring in her pump supplies. 5. Will reassess her mental status tomorrow and see if it is appropriate to transition back to insulin pump vs. subQ insulin shots.  6. Continue levothyroxine at current dose, on empty stomach,  first thing in am waiting at least 30 min before serving breakfast. If she is unable take pills by mouth, alternative is 50 mcg of IV levothyroxine once daily.   Will follow along with you.   Doylene Canning, MD [facility] Endocrinology

## 2015-01-30 NOTE — Progress Notes (Signed)
RN made Dr. Allena Katz aware of positive blood culture, gram positive rods in anerobic bottle. MD gave no new orders.

## 2015-01-30 NOTE — Progress Notes (Signed)
Initial Nutrition Assessment      INTERVENTION:  Coordination of care: Await diet progression   NUTRITION DIAGNOSIS:   Inadequate oral intake related to acute illness as evidenced by NPO status, other (see comment) (thrush in mouth).    GOAL:   Patient will meet greater than or equal to 90% of their needs    MONITOR:    (Energy intake, Glucose profile, Electrolyte and renal profile)  REASON FOR ASSESSMENT:   Malnutrition Screening Tool    ASSESSMENT:      Pt admitted with hypotension, DKA, recent fall last week and thrush  Past Medical History  Diagnosis Date  . Diabetes mellitus type 1     (prior saw Dr. Lodema Hong endo, would like to establish locally)  . Generalized headaches     frequent  . Hypothyroidism   . Convulsions/seizures     unknown type, nml EEG  . History of chicken pox   . Anemia   . Vitamin B12 deficiency 03/2012    normal IF    Current Nutrition: NPO  Food/Nutrition-Related History:  Pt lethargic this am and unable to answer questions during visit Noted per chart poor po intake prior to admission secondary to thrush   Medications: insulin drip, D5 1/2 NS at 15ml/hr  Electrolyte/Renal Profile and Glucose Profile:   Recent Labs Lab 02/17/2015 0214 02/09/2015 0715  NA 126* 129*  K 3.9 3.6  CL 94* 103  CO2 14* 16*  BUN 17 14  CREATININE 1.99* 1.40*  CALCIUM 8.9 7.9*  MG 1.6*  --   PHOS 2.1*  --   GLUCOSE 312* 198*   Protein Profile:  Recent Labs Lab 01/31/2015 0214  ALBUMIN 3.1*    Gastrointestinal Profile: Last BM: unknown   Nutrition-Focused Physical Exam Findings:  Unable to complete Nutrition-Focused physical exam at this time.     Weight Change: Noted per chart wt encounters 15% weight loss in the last 3 months    Diet Order:  Diet NPO time specified  Skin:   reviewed   Height:   Ht Readings from Last 1 Encounters:  01/31/2015  (1.549 m)    Weight:   Wt Readings from Last 1 Encounters:   02/09/2015 119 lb 11.4 oz (54.3 kg)     BMI:  Body mass index is 22.63 kg/(m^2).  Estimated Nutritional Needs:   Kcal:  BEE 1012 kcals (IF 1.0-1.2, AF 1.3) 1447-1710 kcals/d.   Protein:  (1.0-1.2 g/kg) 54-65 g/d  Fluid:  (30-71ml/kg) 1620-1857ml/d  EDUCATION NEEDS:   No education needs identified at this time  MODERATE Care Level  Celese Banner B. Freida Busman, RD, LDN [phone] (pager)

## 2015-01-30 NOTE — H&P (Addendum)
[name] [place]    MR#:  [id number]  DATE OF BIRTH:  10-11-1945  DATE OF ADMISSION:  02/25/2015  PRIMARY CARE PHYSICIAN: Kirk Ruths., MD   REQUESTING/REFERRING PHYSICIAN: Beather Arbour, M.D.  CHIEF COMPLAINT:   Chief Complaint  Patient presents with  . Hypotension    HISTORY OF PRESENT ILLNESS:  [name]  is a 69 y.o. female who presents with hypotension. Patient states that she recently had a fall last week and broke several ribs. She had DKA at that time, and on discharge was sent to [place] facility. There she was noted to have thrush and has been treated the last several days, though she feels is ineffective. Due to her thrush she had poor by mouth intake during that time. On the day prior to admission she was noted to have a low blood pressure, and looked very lethargic and felt very weak. On evaluation in the ED she was noted to be tachycardic, with a slightly elevated white blood count, low blood pressure. She is felt to be severely dehydrated, her lactic acid was 2.5. She also seemed to be in mild DKA, with an anion gap of 18. Hospitalists were called for admission for DKA.  PAST MEDICAL HISTORY:   Past Medical History  Diagnosis Date  . Diabetes mellitus type 1     (prior saw Dr. Moshe Cipro endo, would like to establish locally)  . Generalized headaches     frequent  . Hypothyroidism   . Convulsions/seizures     unknown type, nml EEG  . History of chicken pox   . Anemia   . Vitamin B12 deficiency 03/2012    normal IF    PAST SURGICAL HISTORY:   Past Surgical History  Procedure Laterality Date  . Cesarean section  1983    SOCIAL HISTORY:   Social History  Substance Use Topics  . Smoking status: Never Smoker   . Smokeless tobacco: Never Used  . Alcohol Use: 0.6 oz/week    1 Glasses of wine, 0 Cans of beer, 0 Shots of liquor per week     Comment: Socially (on  weekends)    FAMILY HISTORY:   Family History  Problem Relation Age of Onset  . Stroke Mother   . Hypertension Mother   . Hypertension Father   . Coronary artery disease Father 72    MI  . Hypertension Sister   . Diabetes Cousin     type 1  . Cancer Maternal Aunt     brain  . Hyperlipidemia Brother     DRUG ALLERGIES:  No Known Allergies  MEDICATIONS AT HOME:   Prior to Admission medications   Medication Sig Start Date End Date Taking? Authorizing Provider  Calcium Carbonate-Vit D-Min (CALCIUM/VITAMIN D/MINERALS) 600-200 MG-UNIT TABS Take 1 tablet by mouth daily.   Yes Historical Provider, MD  cyanocobalamin 100 MCG tablet Take 100 mcg by mouth daily.   Yes Historical Provider, MD  nystatin (MYCOSTATIN) 100000 UNIT/ML suspension Take 10 mLs by mouth 4 (four) times daily.   Yes Historical Provider, MD  ondansetron (ZOFRAN) 4 MG tablet Take 8 mg by mouth every 8 (eight) hours as needed for nausea or vomiting.   Yes Historical Provider, MD  tuberculin 5 UNIT/0.1ML injection Inject 5 Units into the skin every 14 (fourteen) days.   Yes Historical Provider, MD  Blood Glucose Monitoring Suppl (ONE TOUCH ULTRA 2) W/DEVICE KIT Every 4 hours 07/19/14  Historical Provider, MD  insulin lispro (HUMALOG) 100 UNIT/ML injection Inject 3 Units into the skin every 4 (four) hours.  12/11/14   Historical Provider, MD  levothyroxine (SYNTHROID, LEVOTHROID) 100 MCG tablet Take 100 mcg by mouth daily before breakfast. 01/02/15   Historical Provider, MD  loratadine (CLARITIN) 10 MG tablet Take 10 mg by mouth daily.    Historical Provider, MD  traMADol (ULTRAM) 50 MG tablet Take 1 tablet (50 mg total) by mouth every 12 (twelve) hours as needed for moderate pain. 01/23/15   Fritzi Mandes, MD    REVIEW OF SYSTEMS:  Review of Systems  Constitutional: Positive for malaise/fatigue. Negative for fever, chills and weight loss.  HENT: Positive for sore throat. Negative for ear pain, hearing loss and tinnitus.    Eyes: Negative for blurred vision, double vision, pain and redness.  Respiratory: Negative for cough, hemoptysis and shortness of breath.   Cardiovascular: Negative for chest pain, palpitations, orthopnea and leg swelling.  Gastrointestinal: Positive for nausea and vomiting. Negative for abdominal pain, diarrhea and constipation.  Genitourinary: Negative for dysuria, frequency and hematuria.  Musculoskeletal: Negative for back pain, joint pain and neck pain.  Skin:       No acne, rash, or lesions  Neurological: Negative for dizziness, tremors, focal weakness and weakness.  Endo/Heme/Allergies: Negative for polydipsia. Does not bruise/bleed easily.  Psychiatric/Behavioral: Negative for depression. The patient is not nervous/anxious and does not have insomnia.      VITAL SIGNS:   Filed Vitals:   02/05/2015 0230 02/28/2015 0253 02/26/2015 0300 02/19/2015 0345  BP: 100/64 1_0  Pulse: 98 100 101 103  Temp:  97.8 F (36.6 C)    TempSrc:  Rectal    Resp:  28 34 20  Weight:      SpO2: 97% 94% 99% 100%   Wt Readings from Last 3 Encounters:  02/07/2015 53.071 kg (117 lb)  01/22/15 56.8 kg (125 lb 3.5 oz)  01/17/15 53.071 kg (117 lb)    PHYSICAL EXAMINATION:  Physical Exam  Vitals reviewed. Constitutional: She is oriented to person, place, and time. She appears well-developed and well-nourished. No distress.  HENT:  Head: Normocephalic and atraumatic.  Mucosal membranes dry  Eyes: Conjunctivae and EOM are normal. Pupils are equal, round, and reactive to light. No scleral icterus.  Neck: Normal range of motion. Neck supple. No JVD present. No thyromegaly present.  Cardiovascular: Regular rhythm and intact distal pulses.  Exam reveals no gallop and no friction rub.   No murmur heard. Tachycardic  Respiratory: Effort normal and breath sounds normal. No respiratory distress. She has no wheezes. She has no rales.  GI: Soft. Bowel sounds are normal. She exhibits no distension.  There is no tenderness.  Musculoskeletal: Normal range of motion. She exhibits no edema.  No arthritis, no gout  Lymphadenopathy:    She has no cervical adenopathy.  Neurological: She is alert and oriented to person, place, and time. No cranial nerve deficit.  No dysarthria, no aphasia  Skin: Skin is warm and dry. No rash noted. No erythema.  Psychiatric: She has a normal mood and affect. Her behavior is normal. Judgment and thought content normal.    LABORATORY PANEL:   CBC  Recent Labs Lab 02/01/2015 0214  WBC 13.1*  HGB 11.2*  HCT 34.5*  PLT 356   ------------------------------------------------------------------------------------------------------------------  Chemistries   Recent Labs Lab 02/04/2015 0214  NA 126*  K 3.9  CL 94*  CO2 14*  GLUCOSE 312*  BUN 17  CREATININE 1.99*  CALCIUM 8.9  AST 31  ALT 13*  ALKPHOS 155*  BILITOT 1.2   ------------------------------------------------------------------------------------------------------------------  Cardiac Enzymes  Recent Labs Lab 02/21/2015 0214  TROPONINI 0.05*   ------------------------------------------------------------------------------------------------------------------  RADIOLOGY:  Dg Chest Port 1 View  02/08/2015   CLINICAL DATA:  Acute onset of generalized weakness. Initial encounter.  EXAM: PORTABLE CHEST - 1 VIEW  COMPARISON:  Chest radiograph performed 01/19/2015  FINDINGS: The lungs are well-aerated. Minimal bilateral atelectasis is noted. There is no evidence of pleural effusion or pneumothorax.  The cardiomediastinal silhouette is within normal limits. No acute osseous abnormalities are seen.  IMPRESSION: Minimal bilateral atelectasis noted.  Lungs otherwise clear.   Electronically Signed   By: Garald Balding M.D.   On: 01/31/2015 02:33    EKG:   Orders placed or performed during the hospital encounter of 02/03/2015  . EKG 12-Lead  . EKG 12-Lead  . EKG 12-Lead  . EKG 12-Lead  . ED EKG  .  ED EKG    IMPRESSION AND PLAN:  Principal Problem:   DKA (diabetic ketoacidoses) - IV insulin, aggressive IV fluids per DKA insulin drip protocol, serial BMP, replace potassium as needed, DKA likely due to poor by mouth intake Active Problems:   Dehydration - due to significant for by mouth intake, aggressive fluids for rehydration as above   SIRS (systemic inflammatory response syndrome) - unclear at this time whether this represents brewing sepsis versus response from her DKA and profound dehydration, broad antibiotics started, blood and urine cultures ordered. Chest x-ray does not suggest pneumonia this time, however with bibasilar atelectasis and in the setting of recently broken ribs will monitor for potential brewing pneumonia.   ARF (acute renal failure) - due to profound dehydration, aggressive fluids as above and monitor serum creatinine   Type I diabetes mellitus - normally uses insulin pump, IV insulin for DKA as above, transition per protocol once her anion gap is closed   Broken ribs - when necessary pain control, incentive spirometry   Hypothyroidism - home meds  All the records are reviewed and case discussed with ED provider. Management plans discussed with the patient and/or family.  DVT PROPHYLAXIS: SubQ heparin  ADMISSION STATUS: Inpatient  CODE STATUS: Full  TOTAL CRITICAL CARE TIME TAKING CARE OF THIS PATIENT: 50 minutes.    Karolyne Timmons FIELDING 02/23/2015, 3:54 AM  [facility]  Office  [phone]  CC: Primary care physician; Kirk Ruths., MD

## 2015-01-30 NOTE — Progress Notes (Signed)
eLink Physician-Brief Progress Note Patient Name: [name] DOB: January 03, 1946 MRN: [id number]   Date of Service  2015/02/27  HPI/Events of Note  Admitted with DKA  eICU Interventions  Continue current management of DKA.         Shane Crutch 2015/02/27, 6:27 AM

## 2015-01-30 NOTE — ED Notes (Signed)
Pt insulin pump removed by dr. Dolores Frame

## 2015-01-30 NOTE — Progress Notes (Signed)
Updated Dr. Anne Hahn about Pt's low BP (79/54). New orders given. Will continue to monitor and will check back with Dr. Anne Hahn.

## 2015-01-30 NOTE — Patient Outreach (Signed)
[facility]) Care Management  02/01/2015  [name] 17-Feb-1946 [id number]   Referral from Berna Spare, assigned due to patient in patient at [facility], assigned Janci Minor, RN to outreach.  Thanks, Corrie Mckusick. Sharlee Blew Physician [facility] Care Management [facility] CM Assistant Phone: [phone] Fax: [phone]

## 2015-01-30 NOTE — Progress Notes (Signed)
ANTIBIOTIC CONSULT NOTE - INITIAL  Pharmacy Consult for vancomycin/Zosyn Indication: PNA  No Known Allergies  Patient Measurements: Weight: 117 lb (53.071 kg) Adjusted Body Weight: 50 kg  Vital Signs: Temp: 97.8 F (36.6 C) (09/01 0253) Temp Source: Rectal (09/01 0253) BP: 97/43 mmHg (09/01 0417) Pulse Rate: 103 (09/01 0345) Intake/Output from previous day:   Intake/Output from this shift:    Labs:  Recent Labs  02/09/2015 0214  WBC 13.1*  HGB 11.2*  PLT 356  CREATININE 1.99*   Estimated Creatinine Clearance: 20.7 mL/min (by C-G formula based on Cr of 1.99). No results for input(s): VANCOTROUGH, VANCOPEAK, VANCORANDOM, GENTTROUGH, GENTPEAK, GENTRANDOM, TOBRATROUGH, TOBRAPEAK, TOBRARND, AMIKACINPEAK, AMIKACINTROU, AMIKACIN in the last 72 hours.   Microbiology: Recent Results (from the past 720 hour(s))  MRSA PCR Screening     Status: None   Collection Time: 01/20/15  1:15 PM  Result Value Ref Range Status   MRSA by PCR NEGATIVE NEGATIVE Final    Comment:        The GeneXpert MRSA Assay (FDA approved for NASAL specimens only), is one component of a comprehensive MRSA colonization surveillance program. It is not intended to diagnose MRSA infection nor to guide or monitor treatment for MRSA infections.   Urine culture     Status: None   Collection Time: 01/22/15 12:55 PM  Result Value Ref Range Status   Specimen Description URINE, RANDOM  Final   Special Requests NONE  Final   Culture NO GROWTH 1 DAY  Final   Report Status 01/23/2015 FINAL  Final    Medical History: Past Medical History  Diagnosis Date  . Diabetes mellitus type 1     (prior saw Dr. Lodema Hong endo, would like to establish locally)  . Generalized headaches     frequent  . Hypothyroidism   . Convulsions/seizures     unknown type, nml EEG  . History of chicken pox   . Anemia   . Vitamin B12 deficiency 03/2012    normal IF    Medications:  Infusions:  . sodium chloride    . dextrose  5 % and 0.45% NaCl 100 mL/hr at 02/14/2015 0429  . insulin (NOVOLIN-R) infusion     Assessment: 68 yof cc hypotension here with SIRS tachy/leuko severely dehydrated with DKA AG 18. CXR not suggesting PNA but starting abx for sepsis.  VD 34.9 L, Ke 0.022 hr-1, T1/2 31 hr, predicted trough 17.5 hr.  Goal of Therapy:  Vancomycin trough level 15-20 mcg/ml  Plan:  Expected duration 7 days with resolution of temperature and/or normalization of WBC. CrCl approximately 21 mL/min and I expect once euvolemia is achieved it will improve. Zosyn 3.375 gm IV Q8H EI and vancomycin 750 mg IV Q36H. Vancomycin trough ordered before third dose because of long dosing interval, expectation of renal improvement, and need to quickly achieve therapeutic level in septic patient. Will adjust as needed to maintain trough 15 to 20 mcg/mL.  Carola Frost, Pharm.D. Clinical Pharmacist 01/31/2015,5:16 AM

## 2015-01-30 NOTE — Progress Notes (Signed)
ANTIBIOTIC CONSULT NOTE - INITIAL  Pharmacy Consult for vancomycin/Zosyn Indication: PNA  No Known Allergies  Patient Measurements: Height:  (154.9 cm) Weight: 119 lb 11.4 oz (54.3 kg) IBW/kg (Calculated) : 47.8 Adjusted Body Weight: 50 kg  Vital Signs: Temp: 98.4 F (36.9 C) (09/01 0800) Temp Source: Oral (09/01 0800) BP: 95/46 mmHg (09/01 1100) Pulse Rate: 97 (09/01 1100) Intake/Output from previous day: 08/31 0701 - 09/01 0700 In: 2285.5 [I.V.:1885.5; IV Piggyback:400] Out: -  Intake/Output from this shift: Total I/O In: 304.9 [I.V.:304.9] Out: -   Labs:  Recent Labs  02/25/2015 0214 02/26/2015 0715  WBC 13.1*  --   HGB 11.2*  --   PLT 356  --   CREATININE 1.99* 1.40*   Estimated Creatinine Clearance: 29.4 mL/min (by C-G formula based on Cr of 1.4). No results for input(s): VANCOTROUGH, VANCOPEAK, VANCORANDOM, GENTTROUGH, GENTPEAK, GENTRANDOM, TOBRATROUGH, TOBRAPEAK, TOBRARND, AMIKACINPEAK, AMIKACINTROU, AMIKACIN in the last 72 hours.   Microbiology: Recent Results (from the past 720 hour(s))  MRSA PCR Screening     Status: None   Collection Time: 01/20/15  1:15 PM  Result Value Ref Range Status   MRSA by PCR NEGATIVE NEGATIVE Final    Comment:        The GeneXpert MRSA Assay (FDA approved for NASAL specimens only), is one component of a comprehensive MRSA colonization surveillance program. It is not intended to diagnose MRSA infection nor to guide or monitor treatment for MRSA infections.   Urine culture     Status: None   Collection Time: 01/22/15 12:55 PM  Result Value Ref Range Status   Specimen Description URINE, RANDOM  Final   Special Requests NONE  Final   Culture NO GROWTH 1 DAY  Final   Report Status 01/23/2015 FINAL  Final  Culture, blood (routine x 2)     Status: None (Preliminary result)   Collection Time: 02/14/2015  3:34 AM  Result Value Ref Range Status   Specimen Description BLOOD BLOOD RIGHT FOREARM  Final   Special Requests  BOTTLES DRAWN AEROBIC AND ANAEROBIC  Final   Culture NO GROWTH < 12 HOURS  Final   Report Status PENDING  Incomplete  Culture, blood (routine x 2)     Status: None (Preliminary result)   Collection Time: 02/21/2015  3:34 AM  Result Value Ref Range Status   Specimen Description BLOOD RIGHT HAND  Final   Special Requests BOTTLES DRAWN AEROBIC AND ANAEROBIC  Final   Culture NO GROWTH < 12 HOURS  Final   Report Status PENDING  Incomplete  MRSA PCR Screening     Status: None   Collection Time: 02/28/2015  5:15 AM  Result Value Ref Range Status   MRSA by PCR NEGATIVE NEGATIVE Final    Comment:        The GeneXpert MRSA Assay (FDA approved for NASAL specimens only), is one component of a comprehensive MRSA colonization surveillance program. It is not intended to diagnose MRSA infection nor to guide or monitor treatment for MRSA infections.     Medical History: Past Medical History  Diagnosis Date  . Diabetes mellitus type 1     (prior saw Dr. Lodema Hong endo, would like to establish locally)  . Generalized headaches     frequent  . Hypothyroidism   . Convulsions/seizures     unknown type, nml EEG  . History of chicken pox   . Anemia   . Vitamin B12 deficiency 03/2012    normal IF  Medications:  Infusions:  . dextrose 5 % and 0.45% NaCl 100 mL/hr at 02/01/2015 0429  . insulin (NOVOLIN-R) infusion 0 Units/hr (02/09/2015 1123)   Assessment: 68 yof cc hypotension here with SIRS tachy/leuko severely dehydrated with DKA AG 18. CXR not suggesting PNA but starting abx for sepsis. SCr has improved somewhat from yesterday.   Goal of Therapy:  Vancomycin trough level 15-20 mcg/ml  Plan:  SCr has improved somewhat from yesterday so will increase vancomycin dosing to 750 mg iv q 24 hours. Will f/u renal function in AM as further adjustments may be needed as SCr normalizes. Will hold off on ordering trough for now. Continue Zosyn as ordered.  Expected duration 7 days with resolution  of temperature and/or normalization of WBC.  Luisa Hart D, Pharm.D. Clinical Pharmacist 02/26/2015,12:29 PM

## 2015-01-30 NOTE — ED Provider Notes (Signed)
[facility] Emergency Department Provider Note  ____________________________________________  Time seen: Approximately 2:12 AM  I have reviewed the triage vital signs and the nursing notes.   HISTORY  Chief Complaint Hypotension    HPI [name] is a 69 y.o. female who presents to the ED via EMS from [facility] for a chief complaint of hypotension. Patient has a history of type 1 diabetes who wears an insulin pump. Per EMS, nursing home staff reports they do not know when the patient last ate or drank anything. She is still wearing her insulin pump. EMS reports they were unable to obtain IV access and patient was hypotensive upon their arrival with BP 60s/30s. Patient denies fever, chills, chest pain, shortness of breath, abdominal pain, vomiting, diarrhea. States she feels dehydrated, generally weak and has had dysuria. Nursing staff reports hyperglycemia for the past several days.  Past Medical History  Diagnosis Date  . Diabetes mellitus type 1     (prior saw Dr. Moshe Cipro endo, would like to establish locally)  . Generalized headaches     frequent  . Hypothyroidism   . Convulsions/seizures     unknown type, nml EEG  . History of chicken pox   . Anemia   . Vitamin B12 deficiency 03/2012    normal IF    Patient Active Problem List   Diagnosis Date Noted  . Malnutrition of moderate degree 01/22/2015  . ARF (acute renal failure) 01/19/2015  . Dehydration 01/19/2015  . Hypoglycemia 10/01/2014  . Diabetes 10/01/2014  . Viral URI with cough 07/10/2013  . Medicare annual wellness visit, initial 06/19/2012  . Vitamin B12 deficiency 06/11/2012  . Anemia 04/18/2012  . Fall from bed 03/14/2012  . EDEMA, HANDS 01/02/2010  . HYPOTHYROIDISM 03/22/2007  . DIABETES MELLITUS, TYPE I 03/22/2007  . HYPERLIPIDEMIA 03/22/2007    Past Surgical History  Procedure Laterality Date  . Cesarean section  1983    Current Outpatient Rx  Name  Route  Sig   Dispense  Refill  . Calcium Carbonate-Vit D-Min (CALCIUM/VITAMIN D/MINERALS) 600-200 MG-UNIT TABS   Oral   Take 1 tablet by mouth daily.         . cyanocobalamin 100 MCG tablet   Oral   Take 100 mcg by mouth daily.         Marland Kitchen nystatin (MYCOSTATIN) 100000 UNIT/ML suspension   Oral   Take 10 mLs by mouth 4 (four) times daily.         . ondansetron (ZOFRAN) 4 MG tablet   Oral   Take 8 mg by mouth every 8 (eight) hours as needed for nausea or vomiting.         . tuberculin 5 UNIT/0.1ML injection   Intradermal   Inject 5 Units into the skin every 14 (fourteen) days.         . Blood Glucose Monitoring Suppl (ONE TOUCH ULTRA 2) W/DEVICE KIT      Every 4 hours         . insulin lispro (HUMALOG) 100 UNIT/ML injection   Subcutaneous   Inject 3 Units into the skin every 4 (four) hours.          Marland Kitchen levothyroxine (SYNTHROID, LEVOTHROID) 100 MCG tablet   Oral   Take 100 mcg by mouth daily before breakfast.      3   . loratadine (CLARITIN) 10 MG tablet   Oral   Take 10 mg by mouth daily.         Marland Kitchen  traMADol (ULTRAM) 50 MG tablet   Oral   Take 1 tablet (50 mg total) by mouth every 12 (twelve) hours as needed for moderate pain.   30 tablet   0     Allergies Review of patient's allergies indicates no known allergies.  Family History  Problem Relation Age of Onset  . Stroke Mother   . Hypertension Mother   . Hypertension Father   . Coronary artery disease Father 24    MI  . Hypertension Sister   . Diabetes Cousin     type 1  . Cancer Maternal Aunt     brain  . Hyperlipidemia Brother     Social History Social History  Substance Use Topics  . Smoking status: Never Smoker   . Smokeless tobacco: Never Used  . Alcohol Use: 0.6 oz/week    1 Glasses of wine, 0 Cans of beer, 0 Shots of liquor per week     Comment: Socially (on weekends)    Review of Systems Constitutional:  positive for generalized weakness. No fever/chills Eyes: No visual changes. ENT:   Positive for dry mouth.No sore throat. Cardiovascular: Denies chest pain. Respiratory: Denies shortness of breath. Gastrointestinal: No abdominal pain.  No nausea, no vomiting.  No diarrhea.  No constipation. Genitourinary: Positive for dysuria. Musculoskeletal: Negative for back pain. Skin: Negative for rash. Neurological: Negative for headaches, focal weakness or numbness.  10-point ROS otherwise negative.  ____________________________________________   PHYSICAL EXAM:  VITAL SIGNS: ED Triage Vitals  Enc Vitals Group     BP 02/14/2015 0210 87/56 mmHg     Pulse Rate 02/19/2015 0210 82     Resp 02/07/2015 0210 22     Temp 02/25/2015 0210 98.9 F (37.2 C)     Temp Source 02/08/2015 0210 Oral     SpO2 02/06/2015 0210 100 %     Weight 02/04/2015 0210 117 lb (53.071 kg)     Height --      Head Cir --      Peak Flow --      Pain Score 02/13/2015 0210 0     Pain Loc --      Pain Edu? --      Excl. in [place]? --     Constitutional: Somnolent, cachectic, ill-appearing and in moderate acute distress. Eyes: Conjunctivae are normal. PERRL. EOMI. Head: Atraumatic. Nose: No congestion/rhinnorhea. Mouth/Throat: Mucous membranes are severely dry. Lips are stuck together due to dryness.  Oropharynx non-erythematous. Neck: No stridor.   Cardiovascular: Normal rate, regular rhythm. Grossly normal heart sounds. Thready peripheral circulation. Respiratory: Shallow respiratory effort.  No retractions. Lungs CTAB. Gastrointestinal: Soft and nontender. No distention. No abdominal bruits. No CVA tenderness. Musculoskeletal: No lower extremity tenderness nor edema.  No joint effusions. Neurologic:  Normal speech and language. No gross focal neurologic deficits are appreciated.  Skin:  Skin is warm, dry and intact. No rash noted. Poor skin turgor. Psychiatric: Mood and affect are flat. Speech and behavior are flat.  ____________________________________________   LABS (all labs ordered are listed, but only  abnormal results are displayed)  Labs Reviewed  CBC WITH DIFFERENTIAL/PLATELET - Abnormal; Notable for the following:    WBC 13.1 (*)    Hemoglobin 11.2 (*)    HCT 34.5 (*)    Neutro Abs 10.0 (*)    Eosinophils Absolute 0.8 (*)    All other components within normal limits  COMPREHENSIVE METABOLIC PANEL - Abnormal; Notable for the following:    Sodium 126 (*)  Chloride 94 (*)    CO2 14 (*)    Glucose, Bld 312 (*)    Creatinine, Ser 1.99 (*)    Albumin 3.1 (*)    ALT 13 (*)    Alkaline Phosphatase 155 (*)    GFR calc non Af Amer 25 (*)    GFR calc Af Amer 29 (*)    Anion gap 18 (*)    All other components within normal limits  TROPONIN I - Abnormal; Notable for the following:    Troponin I 0.05 (*)    All other components within normal limits  LACTIC ACID, PLASMA - Abnormal; Notable for the following:    Lactic Acid, Venous 2.5 (*)    All other components within normal limits  URINALYSIS COMPLETEWITH MICROSCOPIC ([facility] ONLY) - Abnormal; Notable for the following:    Color, Urine YELLOW (*)    APPearance CLEAR (*)    Glucose, UA >500 (*)    Ketones, ur 2+ (*)    Hgb urine dipstick 1+ (*)    Squamous Epithelial / LPF 0-5 (*)    All other components within normal limits  LACTIC ACID, PLASMA   ____________________________________________  EKG  ED ECG REPORT I, [place] Oborn J, the attending physician, personally viewed and interpreted this ECG.   Date: 02/10/2015  EKG Time: 0213  Rate: 94  Rhythm: normal EKG, normal sinus rhythm  Axis: Normal  Intervals:none  ST&T Change: Nonspecific  ____________________________________________  RADIOLOGY  Portable chest x-ray (viewed by me, interpreted per Dr. Radene Knee: Minimal bilateral atelectasis noted. Lungs otherwise clear.  ____________________________________________   PROCEDURES  Procedure(s) performed: None  Critical Care performed:   CRITICAL CARE Performed by: Paulette Blanch   Total critical care time: 30  minutes  Critical care time was exclusive of separately billable procedures and treating other patients.  Critical care was necessary to treat or prevent imminent or life-threatening deterioration.  Critical care was time spent personally by me on the following activities: development of treatment plan with patient and/or surrogate as well as nursing, discussions with consultants, evaluation of patient's response to treatment, examination of patient, obtaining history from patient or surrogate, ordering and performing treatments and interventions, ordering and review of laboratory studies, ordering and review of radiographic studies, pulse oximetry and re-evaluation of patient's condition.  ____________________________________________   INITIAL IMPRESSION / ASSESSMENT AND PLAN / ED COURSE  Pertinent labs & imaging results that were available during my care of the patient were reviewed by me and considered in my medical decision making (see chart for detaildetails).  69 year-old female with a history of IDDM on an insulin pump who presents from nursing home with hyperglycemia and severe dehydration. Blood sugar 360 per EMS. Patient is more alert upon arrival and is able to discontinue her insulin pump at my request. Will initiate IV fluid resuscitation, screening lab work including lactate, urinalysis, chest x-ray. Anticipate admission for generalized weakness secondary to severe dehydration.  ----------------------------------------- 3:06 AM on 02/22/2015 -----------------------------------------  Laboratory results notable for 2+ ketones and urinalysis, mild leukocytosis, hyponatremia and elevated renal function consistent with dehydration. Elevated anion gap most likely secondary to metabolic acidosis. Elevated troponin with elevated lactate. Will obtain blood cultures and cover patient with broad-spectrum IV antibiotics. Consider insulin for hyperglycemia with ketones and elevated anion gap  suggestive of DKA. Will discuss with hospitalist for evaluation in the ED for admission.  ____________________________________________   FINAL CLINICAL IMPRESSION(S) / ED DIAGNOSES  Final diagnoses:  Weakness  Dehydration  Metabolic acidosis  Renal insufficiency  Malnutrition  DKA    Paulette Blanch, MD 02/01/2015 [phone]

## 2015-01-30 NOTE — Progress Notes (Signed)
RN spoke with Dr. Allena Katz on the phone and made him aware of critical lactic acid of 2.1 and made MD aware that patient is drowsy and blood pressure is in high 80's-90's with MAP of 61-62. RN also made MD aware that inpatient diabetes RN would like endocrinology to be consulted. Dr. Allena Katz stated "I will order the consult and just monitor her for now." no further orders.

## 2015-01-30 NOTE — Progress Notes (Signed)
[facility] [place] [facility]                                                                                                                                                                                            Patient Demographics   [name], is a 69 y.o. female, DOB - Sep 22, 1945, NWG:[id number]  Admit date - 02/18/2015   Admitting Physician Oralia Manis, MD  Outpatient Primary MD for the patient is Lauro Regulus., MD   LOS - 0  Subjective: Patient was recently hospitalized with nausea vomiting and DKA. He was discharged to rehabilitation. Patient at the skilled nursing facility was noted to have hypotension. She was brought to the emergency room and was noted to be very lethargic and very weak. Patient was admitted with DKA because of her anion gap being 18. And lactic acid being 2.5 she was thought to have sepsis. Patient continues to be drowsy. And very weak. Her anion gap has closed however she was seen by endocrinology and due to her. Poor by mouth intake and mental status they recommended continuing the insulin drip for now.     Review of Systems:   Limited due to patient being lethargic   Vitals:   Filed Vitals:   02/12/2015 0900 02/13/2015 1000 02/04/2015 1100 02/11/2015 1200  BP: 99/45 98/45 95/46  88/50  Pulse: 96 97 97 97  Temp:      TempSrc:      Resp: 26 26 22 27   Height:      Weight:      SpO2: 100% 100% 100% 100%    Wt Readings from Last 3 Encounters:  02/20/2015 54.3 kg (119 lb 11.4 oz)  01/22/15 56.8 kg (125 lb 3.5 oz)  01/17/15 53.071 kg (117 lb)     Intake/Output Summary (Last 24 hours) at 01/31/2015 1343 Last data filed at 02/04/2015 1328  Gross per 24 hour  Intake 2690.92 ml  Output      0 ml  Net 2690.92 ml    Physical Exam:   GENERAL: Appears very weak and tired  HEAD, EYES, EARS, NOSE AND THROAT: Atraumatic, normocephalic. Extraocular muscles are intact. Pupils equal and reactive to light. Sclerae anicteric.  No conjunctival injection. No oro-pharyngeal erythema.  NECK: Supple. There is no jugular venous distention. No bruits, no lymphadenopathy, no thyromegaly.  HEART: Regular rate and rhythm,. No murmurs, no rubs, no clicks.  LUNGS: Clear to auscultation bilaterally. No rales or rhonchi. No wheezes.  ABDOMEN: Soft, flat, nontender, nondistended. Has good bowel sounds. No hepatosplenomegaly appreciated.  EXTREMITIES: No evidence of any cyanosis, clubbing, or peripheral edema.  +  2 pedal and radial pulses bilaterally.  NEUROLOGIC: Patient is sleepy but able to move all extremities no focal deficit appreciated SKIN: Moist and warm with no rashes appreciated.  Psych: Not anxious, depressed LN: No inguinal LN enlargement    Antibiotics   Anti-infectives    Start     Dose/Rate Route Frequency Ordered Stop   01/31/15 1600  vancomycin (VANCOCIN) IVPB 750 mg/150 ml premix  Status:  Discontinued     750 mg 150 mL/hr over 60 Minutes Intravenous Every 36 hours 02/11/2015 0516 02/03/2015 1228   01/31/15 0400  vancomycin (VANCOCIN) IVPB 750 mg/150 ml premix     750 mg 150 mL/hr over 60 Minutes Intravenous Every 24 hours 02/19/2015 1228     02/05/2015 0600  piperacillin-tazobactam (ZOSYN) IVPB 3.375 g     3.375 g 12.5 mL/hr over 240 Minutes Intravenous 3 times per day 02/13/2015 0516     02/01/2015 0345  vancomycin (VANCOCIN) IVPB 1000 mg/200 mL premix     1,000 mg 200 mL/hr over 60 Minutes Intravenous  Once 02/03/2015 0331 02/13/2015 0506   02/16/2015 0345  piperacillin-tazobactam (ZOSYN) IVPB 3.375 g  Status:  Discontinued     3.375 g 12.5 mL/hr over 240 Minutes Intravenous  Once 02/26/2015 0331 02/10/2015 0745      Medications   Scheduled Meds: . antiseptic oral rinse  7 mL Mouth Rinse BID  . heparin  5,000 Units Subcutaneous 3 times per day  . levothyroxine  100 mcg Oral QAC breakfast  . magnesium sulfate 1 - 4 g bolus IVPB  2 g Intravenous Once  . ofloxacin  1 drop Both Eyes QID  . piperacillin-tazobactam  (ZOSYN)  IV  3.375 g Intravenous 3 times per day  . potassium phosphate IVPB (mmol)  20 mmol Intravenous Once  . sodium chloride  3 mL Intravenous Q12H  . [START ON 01/31/2015] vancomycin  750 mg Intravenous Q24H   Continuous Infusions: . dextrose 5 % and 0.45% NaCl 100 mL/hr at 02/18/2015 0429  . insulin (NOVOLIN-R) infusion Stopped (02/14/2015 1328)   PRN Meds:.acetaminophen **OR** acetaminophen, ondansetron **OR** ondansetron (ZOFRAN) IV   Data Review:   Micro Results Recent Results (from the past 240 hour(s))  Urine culture     Status: None   Collection Time: 01/22/15 12:55 PM  Result Value Ref Range Status   Specimen Description URINE, RANDOM  Final   Special Requests NONE  Final   Culture NO GROWTH 1 DAY  Final   Report Status 01/23/2015 FINAL  Final  Culture, blood (routine x 2)     Status: None (Preliminary result)   Collection Time: 02/12/2015  3:34 AM  Result Value Ref Range Status   Specimen Description BLOOD BLOOD RIGHT FOREARM  Final   Special Requests BOTTLES DRAWN AEROBIC AND ANAEROBIC  Final   Culture NO GROWTH < 12 HOURS  Final   Report Status PENDING  Incomplete  Culture, blood (routine x 2)     Status: None (Preliminary result)   Collection Time: 02/25/2015  3:34 AM  Result Value Ref Range Status   Specimen Description BLOOD RIGHT HAND  Final   Special Requests BOTTLES DRAWN AEROBIC AND ANAEROBIC  Final   Culture NO GROWTH < 12 HOURS  Final   Report Status PENDING  Incomplete  MRSA PCR Screening     Status: None   Collection Time: 02/13/2015  5:15 AM  Result Value Ref Range Status   MRSA by PCR NEGATIVE NEGATIVE Final    Comment:  The GeneXpert MRSA Assay (FDA approved for NASAL specimens only), is one component of a comprehensive MRSA colonization surveillance program. It is not intended to diagnose MRSA infection nor to guide or monitor treatment for MRSA infections.     Radiology Reports Dg Chest 2 View  01/19/2015   CLINICAL DATA:   Status post left fourth through sixth rib fractures 01/17/2015 after a fall. Vomiting and diarrhea today. Subsequent encounter.  EXAM: CHEST  2 VIEW  COMPARISON:  Plain films of the chest and left ribs 01/17/2015. PA and lateral chest 05/15/2013. CT chest 01/06/2010.  FINDINGS: Left fourth through seventh rib fractures are noted. No new fracture is identified. The lungs are clear. No pneumothorax or pleural effusion is identified. Heart size is normal. Aortic atherosclerosis is noted.  IMPRESSION: Left fourth through seventh rib fractures. Negative for pneumothorax or acute cardiopulmonary disease.   Electronically Signed   By: Drusilla Kanner M.D.   On: 01/19/2015 14:14   Dg Ribs Unilateral W/chest Left  01/17/2015   CLINICAL DATA:  Status post fall today complaining of left posterior rib pain.  EXAM: LEFT RIBS AND CHEST - 3+ VIEW  COMPARISON:  May 15, 2013  FINDINGS: There are displaced fractures of the left fourth, fifth, sixth, seventh ribs. There questioned fractures of the left second and third ribs. There is no pneumothorax. The lungs are clear. The mediastinal contour and cardiac silhouette are normal.  IMPRESSION: Fractures of left ribs as described.   Electronically Signed   By: Sherian Rein M.D.   On: 01/17/2015 16:53   Dg Thoracic Spine 2 View  01/17/2015   CLINICAL DATA:  Patient status post fall while walking up the stairs. Left posterior rib and thoracic spine pain worse when breathing.  EXAM: THORACIC SPINE 2 VIEWS  COMPARISON:  Chest radiograph 05/15/2013  FINDINGS: Superior thoracic vertebral bodies are not well visualized. The mid and lower thoracic vertebral bodies are visualized and appear in normal height with normal intervertebral disc spaces. There is rightward curvature of the proximal thoracic spine demonstrated on the AP view. Multiple left-sided rib fractures, incompletely evaluated on current exam.  IMPRESSION: Rightward curvature of the proximal thoracic spine. Recommend  correlation for point tenderness at this location.  Otherwise relative preservation the vertebral body heights.  Incompletely visualized probable left rib fractures. See dedicated chest and rib report.   Electronically Signed   By: Annia Belt M.D.   On: 01/17/2015 16:52   Ct Head Wo Contrast  01/19/2015   CLINICAL DATA:  Intractable vomiting over the last few days.  EXAM: CT HEAD WITHOUT CONTRAST  TECHNIQUE: Contiguous axial images were obtained from the base of the skull through the vertex without intravenous contrast.  COMPARISON:  Head CT 01/17/2015  FINDINGS: Brainstem is normal. Chronically known 1 cm lesion of the left cerebellum is again visible, consistent with a chronic cavernous angioma. Some adjacent brain low density is present as has been seen previously. No sign that there is any active process in this region. The cerebral hemispheres are normal. No evidence of old or acute infarction, mass lesion, hemorrhage, hydrocephalus or extra-axial collection. Chronic benign left calvarial lucency likely related to a hemangioma. No acute calvarial finding. Sinuses, middle ears and mastoids are clear.  IMPRESSION: No acute intracranial finding. 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 Based on the unchanged appearance, this would be on likely to relate to the clinical presentation.   Electronically Signed   By: Scherrie Bateman.D.  On: 01/19/2015 13:31   Ct Head Wo Contrast  01/17/2015   CLINICAL DATA:  Larey Seat downstairs today.  Hit head.  EXAM: CT HEAD WITHOUT CONTRAST  CT CERVICAL SPINE WITHOUT CONTRAST  TECHNIQUE: Multidetector CT imaging of the head and cervical spine was performed following the standard protocol without intravenous contrast. Multiplanar CT image reconstructions of the cervical spine were also generated.  COMPARISON:  Head CT 08/14/2009  FINDINGS: CT HEAD FINDINGS  The ventricles are normal in size and configuration. No extra-axial fluid collections  are identified. The gray-white differentiation is normal. No CT findings for acute intracranial process such as hemorrhage or infarction. No mass lesions. The brainstem and cerebellum are grossly normal.  The bony structures are intact. The paranasal sinuses and mastoid air cells are clear. The globes are intact.  CT CERVICAL SPINE FINDINGS  Degenerative cervical spondylosis with mild multilevel disc disease and facet disease. The cervical vertebral bodies are normally aligned. No acute fracture. The facets are normally aligned. Moderate C1-2 degenerative changes but no dens fracture. The spinal canal is quite generous. No spinal or significant foraminal stenosis. The lung apices are clear.  IMPRESSION: 1. No acute intracranial findings or skull fracture. 2. Mild degenerative cervical spondylosis but no acute cervical spine fracture.   Electronically Signed   By: Rudie Meyer M.D.   On: 01/17/2015 15:21   Ct Cervical Spine Wo Contrast  01/17/2015   CLINICAL DATA:  Larey Seat downstairs today.  Hit head.  EXAM: CT HEAD WITHOUT CONTRAST  CT CERVICAL SPINE WITHOUT CONTRAST  TECHNIQUE: Multidetector CT imaging of the head and cervical spine was performed following the standard protocol without intravenous contrast. Multiplanar CT image reconstructions of the cervical spine were also generated.  COMPARISON:  Head CT 08/14/2009  FINDINGS: CT HEAD FINDINGS  The ventricles are normal in size and configuration. No extra-axial fluid collections are identified. The gray-white differentiation is normal. No CT findings for acute intracranial process such as hemorrhage or infarction. No mass lesions. The brainstem and cerebellum are grossly normal.  The bony structures are intact. The paranasal sinuses and mastoid air cells are clear. The globes are intact.  CT CERVICAL SPINE FINDINGS  Degenerative cervical spondylosis with mild multilevel disc disease and facet disease. The cervical vertebral bodies are normally aligned. No acute  fracture. The facets are normally aligned. Moderate C1-2 degenerative changes but no dens fracture. The spinal canal is quite generous. No spinal or significant foraminal stenosis. The lung apices are clear.  IMPRESSION: 1. No acute intracranial findings or skull fracture. 2. Mild degenerative cervical spondylosis but no acute cervical spine fracture.   Electronically Signed   By: Rudie Meyer M.D.   On: 01/17/2015 15:21   Dg Chest Port 1 View  02/17/2015   CLINICAL DATA:  Acute onset of generalized weakness. Initial encounter.  EXAM: PORTABLE CHEST - 1 VIEW  COMPARISON:  Chest radiograph performed 01/19/2015  FINDINGS: The lungs are well-aerated. Minimal bilateral atelectasis is noted. There is no evidence of pleural effusion or pneumothorax.  The cardiomediastinal silhouette is within normal limits. No acute osseous abnormalities are seen.  IMPRESSION: Minimal bilateral atelectasis noted.  Lungs otherwise clear.   Electronically Signed   By: Roanna Raider M.D.   On: 02/18/2015 02:33   Ct Renal Stone Study  01/19/2015   CLINICAL DATA:  Severe vomiting and hypotension.  EXAM: CT ABDOMEN AND PELVIS WITHOUT CONTRAST  TECHNIQUE: Multidetector CT imaging of the abdomen and pelvis was performed following the standard protocol without IV  contrast.  COMPARISON:  Abdomen CT 07/05/2005  FINDINGS: Lower chest: No acute findings.  Hepatobiliary: No mass visualized on this unenhanced exam. Gallbladder is unremarkable.  Pancreas: No mass or inflammatory process visualized on this unenhanced exam. Diffuse fatty replacement of pancreas noted.  Spleen:  Within normal limits in size.  Adrenal Glands:  No masses identified.  Kidneys/Urinary tract: No evidence of urolithiasis or hydronephrosis.  Stomach/Bowel/Peritoneum: Sigmoid diverticulosis is noted. No evidence of diverticulitis or other inflammatory process. Normal appendix visualized.  Vascular/Lymphatic: No pathologically enlarged lymph nodes identified. No abdominal  aortic aneurysm or other significant retroperitoneal abnormality demonstrated.  Reproductive:  No mass or other significant abnormality noted.  Other:  None.  Musculoskeletal:  No suspicious bone lesions identified.  IMPRESSION: Sigmoid diverticulosis. No radiographic evidence of diverticulitis or other acute findings.   Electronically Signed   By: Myles Rosenthal M.D.   On: 01/19/2015 14:14     CBC  Recent Labs Lab 02/20/2015 0214  WBC 13.1*  HGB 11.2*  HCT 34.5*  PLT 356  MCV 86.1  MCH 27.9  MCHC 32.4  RDW 14.3  LYMPHSABS 1.5  MONOABS 0.8  EOSABS 0.8*  BASOSABS 0.1    Chemistries   Recent Labs Lab 02/16/2015 0214 02/22/2015 0715  NA 126* 129*  K 3.9 3.6  CL 94* 103  CO2 14* 16*  GLUCOSE 312* 198*  BUN 17 14  CREATININE 1.99* 1.40*  CALCIUM 8.9 7.9*  MG 1.6*  --   AST 31  --   ALT 13*  --   ALKPHOS 155*  --   BILITOT 1.2  --    ------------------------------------------------------------------------------------------------------------------ estimated creatinine clearance is 29.4 mL/min (by C-G formula based on Cr of 1.4). ------------------------------------------------------------------------------------------------------------------ No results for input(s): HGBA1C in the last 72 hours. ------------------------------------------------------------------------------------------------------------------ No results for input(s): CHOL, HDL, LDLCALC, TRIG, CHOLHDL, LDLDIRECT in the last 72 hours. ------------------------------------------------------------------------------------------------------------------ No results for input(s): TSH, T4TOTAL, T3FREE, THYROIDAB in the last 72 hours.  Invalid input(s): FREET3 ------------------------------------------------------------------------------------------------------------------ No results for input(s): VITAMINB12, FOLATE, FERRITIN, TIBC, IRON, RETICCTPCT in the last 72 hours.  Coagulation profile No results for input(s): INR,  PROTIME in the last 168 hours.  No results for input(s): DDIMER in the last 72 hours.  Cardiac Enzymes  Recent Labs Lab 02/07/2015 0214  TROPONINI 0.05*   ------------------------------------------------------------------------------------------------------------------ Invalid input(s): POCBNP    Assessment & Plan   #1 DKA (diabetic ketoacidoses) - as per endocrinology recommendation continue insulin drip. Once more awake then we can start her back on her insulin long-acting. They did recommended no insulin pump.  #2 Dehydration - due to poor by mouth intake IV fluids #3 SIRS (systemic inflammatory response syndrome) -unclear source continue broad-spectrum anabiotic's #4 ARF (acute renal failure) - due to profound dehydration, aggressive fluids as above and monitor serum creatinine #5recent ribs fracture- when necessary pain control, incentive spirometry try to avoid narcotics which may be playing a role in her lethargy and weakness #6 Hypothyroidism - continue level thyroxine     Code Status Orders        Start     Ordered   02/27/2015 0505  Full code   Continuous     02/16/2015 0504           Consults endocrinology   DVT Prophylaxis  heparin  Lab Results  Component Value Date   PLT 356 02/22/2015     Time Spent in minutes  45 minutes of critical care time patient still on insulin drip blood glucose and needs to  be monitored closely Auburn Bilberry M.D on 02/16/2015 at 1:43 PM  Between 7am to 6pm - Pager - [phone]  After 6pm go to [email or web address] - password [email or web address] [facility]  [facility]   Office  [phone]

## 2015-01-30 NOTE — ED Notes (Signed)
hospitalist into admit

## 2015-01-31 ENCOUNTER — Inpatient Hospital Stay: Payer: Medicare Other

## 2015-01-31 LAB — CBC
HEMATOCRIT: 29.1 % — AB (ref 35.0–47.0)
Hemoglobin: 9.5 g/dL — ABNORMAL LOW (ref 12.0–16.0)
MCH: 27.3 pg (ref 26.0–34.0)
MCHC: 32.5 g/dL (ref 32.0–36.0)
MCV: 84 fL (ref 80.0–100.0)
Platelets: 260 10*3/uL (ref 150–440)
RBC: 3.47 MIL/uL — ABNORMAL LOW (ref 3.80–5.20)
RDW: 14.2 % (ref 11.5–14.5)
WBC: 21 10*3/uL — AB (ref 3.6–11.0)

## 2015-01-31 LAB — GLUCOSE, CAPILLARY
GLUCOSE-CAPILLARY: 139 mg/dL — AB (ref 65–99)
GLUCOSE-CAPILLARY: 140 mg/dL — AB (ref 65–99)
GLUCOSE-CAPILLARY: 149 mg/dL — AB (ref 65–99)
GLUCOSE-CAPILLARY: 150 mg/dL — AB (ref 65–99)
GLUCOSE-CAPILLARY: 151 mg/dL — AB (ref 65–99)
GLUCOSE-CAPILLARY: 161 mg/dL — AB (ref 65–99)
GLUCOSE-CAPILLARY: 172 mg/dL — AB (ref 65–99)
GLUCOSE-CAPILLARY: 190 mg/dL — AB (ref 65–99)
GLUCOSE-CAPILLARY: 84 mg/dL (ref 65–99)
GLUCOSE-CAPILLARY: 99 mg/dL (ref 65–99)
Glucose-Capillary: 103 mg/dL — ABNORMAL HIGH (ref 65–99)
Glucose-Capillary: 67 mg/dL (ref 65–99)
Glucose-Capillary: 69 mg/dL (ref 65–99)
Glucose-Capillary: 83 mg/dL (ref 65–99)
Glucose-Capillary: 129 mg/dL — ABNORMAL HIGH (ref 65–99)
Glucose-Capillary: 211 mg/dL — ABNORMAL HIGH (ref 65–99)
Glucose-Capillary: 195 mg/dL — ABNORMAL HIGH (ref 65–99)
Glucose-Capillary: 115 mg/dL — ABNORMAL HIGH (ref 65–99)
Glucose-Capillary: 111 mg/dL — ABNORMAL HIGH (ref 65–99)
Glucose-Capillary: 143 mg/dL — ABNORMAL HIGH (ref 65–99)
Glucose-Capillary: 181 mg/dL — ABNORMAL HIGH (ref 65–99)
Glucose-Capillary: 168 mg/dL — ABNORMAL HIGH (ref 65–99)
Glucose-Capillary: 172 mg/dL — ABNORMAL HIGH (ref 65–99)
Glucose-Capillary: 163 mg/dL — ABNORMAL HIGH (ref 65–99)
Glucose-Capillary: 145 mg/dL — ABNORMAL HIGH (ref 65–99)

## 2015-01-31 LAB — BASIC METABOLIC PANEL
Anion gap: 5 (ref 5–15)
BUN: 9 mg/dL (ref 6–20)
CHLORIDE: 108 mmol/L (ref 101–111)
CO2: 15 mmol/L — AB (ref 22–32)
Calcium: 6.8 mg/dL — ABNORMAL LOW (ref 8.9–10.3)
Creatinine, Ser: 1.49 mg/dL — ABNORMAL HIGH (ref 0.44–1.00)
GFR calc Af Amer: 41 mL/min — ABNORMAL LOW (ref >60)
GFR calc non Af Amer: 35 mL/min — ABNORMAL LOW (ref >60)
GLUCOSE: 166 mg/dL — AB (ref 65–99)
POTASSIUM: 3.4 mmol/L — AB (ref 3.5–5.1)
SODIUM: 128 mmol/L — AB (ref 135–145)

## 2015-01-31 LAB — BLOOD GAS, ARTERIAL
ACID-BASE DEFICIT: 9.8 mmol/L — AB (ref 0.0–2.0)
Allens test (pass/fail): POSITIVE — AB
BICARBONATE: 14.9 meq/L — AB (ref 21.0–28.0)
FIO2: 0.21
O2 SAT: 92.8 %
PATIENT TEMPERATURE: 37
pCO2 arterial: 29 mmHg — ABNORMAL LOW (ref 32.0–48.0)
pH, Arterial: 7.32 — ABNORMAL LOW (ref 7.350–7.450)
pO2, Arterial: 72 mmHg — ABNORMAL LOW (ref 83.0–108.0)

## 2015-01-31 LAB — CORTISOL: Cortisol, Plasma: 5.4 ug/dL

## 2015-01-31 LAB — PHOSPHORUS: Phosphorus: 2.7 mg/dL (ref 2.5–4.6)

## 2015-01-31 LAB — MAGNESIUM: Magnesium: 1.6 mg/dL — ABNORMAL LOW (ref 1.7–2.4)

## 2015-01-31 MED ORDER — POTASSIUM CHLORIDE 10 MEQ/100ML IV SOLN
10.0000 meq | INTRAVENOUS | Status: AC
  Administered 2015-01-31 (×4): 10 meq via INTRAVENOUS
  Filled 2015-01-31 (×4): qty 100

## 2015-01-31 MED ORDER — DEXTROSE 50 % IV SOLN
12.0000 mL | Freq: Once | INTRAVENOUS | Status: AC
  Administered 2015-01-31: 12 mL via INTRAVENOUS
  Filled 2015-01-31: qty 50

## 2015-01-31 MED ORDER — LEVOTHYROXINE SODIUM 100 MCG IV SOLR
50.0000 ug | Freq: Every day | INTRAVENOUS | Status: DC
  Administered 2015-02-01 – 2015-02-05 (×5): 50 ug via INTRAVENOUS
  Filled 2015-01-31 (×5): qty 5

## 2015-01-31 MED ORDER — MAGNESIUM SULFATE 4 GM/100ML IV SOLN
4.0000 g | Freq: Once | INTRAVENOUS | Status: AC
  Administered 2015-01-31: 4 g via INTRAVENOUS
  Filled 2015-01-31: qty 100

## 2015-01-31 NOTE — Procedures (Signed)
Central Venous Catheter Insertion Procedure Note TERSA FOTOPOULOS [id number] 03-Mar-1946  Procedure: Insertion of Central Venous Catheter Indications: Drug and/or fluid administration  Procedure Details Consent: Risks of procedure as well as the alternatives and risks of each were explained to the (patient/caregiver).  Consent for procedure obtained. Time Out: Verified patient identification, verified procedure, site/side was marked, verified correct patient position, special equipment/implants available, medications/allergies/relevent history reviewed, required imaging and test results available.  Performed  Maximum sterile technique was used including antiseptics, cap, gloves, gown, hand hygiene, mask and sheet. Skin prep: Chlorhexidine; local anesthetic administered A antimicrobial bonded/coated triple lumen catheter was placed in the right internal jugular vein using the Seldinger technique.  Evaluation Blood flow good Complications: No apparent complications Patient did tolerate procedure well. Chest X-ray ordered to verify placement.  CXR: pending.  Ricarda Frame 01/31/2015, 1:32 AM

## 2015-01-31 NOTE — Progress Notes (Signed)
Updated Dr. Anne Hahn about Pt's unimproved BP with second Bolus running. Pt remains lethargic and only minimal urine output. Orders to finish infusing second bolus at 553ml/hr. New orders for a Central line to be placed and levophed to be started were given. Dr. Tonita Cong notified for [facility] placement.

## 2015-01-31 NOTE — Progress Notes (Signed)
Patient in bed with her eyes closed she responds to her name. Her speech is slurred, she had a hard time swallowing her levothyroxine this morning and actually began to choke afterwards. Dr. Luberta Mutter notified. Also requested an order for a CT scan of her head due to lethargy, slurred speech and inability to swallow. She has a central line to Rt. IJ with levophed at 7 mcg, insulin gtt is currently off she does have the glucose stabilizer and D5NS @ 100 ml/hr.

## 2015-01-31 NOTE — Progress Notes (Signed)
Inpatient Diabetes Program Recommendations  AACE/ADA: New Consensus Statement on Inpatient Glycemic Control (2013)  Target Ranges:  Prepandial:   less than 140 mg/dL      Peak postprandial:   less than 180 mg/dL (1-2 hours)      Critically ill patients:  140 - 180 mg/dL   Reason for Visit:  DKA, Patient had insulin pump prior to admission  Diabetes history: Type 1 diabetes Outpatient Diabetes medications: Insulin pump  Briefly visited patient.  Her daughter was at the bedside.  Patient was very drowsy and did not speak to me.  Daughter states that in the NH, she seemed confused when she spoke to her by phone.  Patient did have insulin pump on while at the nursing home.  Daughter states that the nurses were administering insulin through the pump?  Agree with not putting patient back on the pump.  Daughter concerned about patient's current state.  Discussed with RN and suggested that endocrinology determine insulin doses when transition off insulin drip appropriate.  Thanks, [name], RN, BC-ADM Inpatient Diabetes Coordinator Pager [phone] (8a-5p)

## 2015-01-31 NOTE — Progress Notes (Signed)
Pharmacy Consult for Electrolyte Management   No Known Allergies  Patient Measurements: Height:  (154.9 cm) Weight: 119 lb 11.4 oz (54.3 kg) IBW/kg (Calculated) : 47.8  Vital Signs: Temp: 98.7 F (37.1 C) (09/02 0000) Temp Source: Oral (09/02 0000) BP: 100/51 mmHg (09/02 0500) Pulse Rate: 95 (09/02 0500) Intake/Output from previous day: 09/01 0701 - 09/02 0700 In: 4252.8 [I.V.:1896.1; IV Piggyback:2356.7] Out: 665 [Urine:665] Intake/Output from this shift: Total I/O In: 2533.6 [I.V.:783.6; IV Piggyback:1750] Out: 115 [Urine:115]  Labs:  Recent Labs  02/05/2015 0214 01/31/15 0520  WBC 13.1* 21.0*  HGB 11.2* 9.5*  HCT 34.5* 29.1*  PLT 356 260     Recent Labs  01/30/2015 0214  02/07/2015 1847 02/12/2015 2108 01/31/15 0520  NA 126*  < > 127* 126* 128*  K 3.9  < > 3.8 3.5 3.4*  CL 94*  < > 102 102 108  CO2 14*  < > 16* 17* 15*  GLUCOSE 312*  < > 199* 204* 166*  BUN 17  < > CREATININE 1.99*  < > 1.37* 1.53* 1.49*  CALCIUM 8.9  < > 7.7* 7.3* 6.8*  MG 1.6*  --   --   --  1.6*  PHOS 2.1*  --   --   --  2.7  PROT 6.8  --   --   --   --   ALBUMIN 3.1*  --   --   --   --   AST 31  --   --   --   --   ALT 13*  --   --   --   --   ALKPHOS 155*  --   --   --   --   BILITOT 1.2  --   --   --   --   < > = values in this interval not displayed. Estimated Creatinine Clearance: 27.6 mL/min (by C-G formula based on Cr of 1.49).    Recent Labs  01/31/15 0349 01/31/15 0444 01/31/15 0538  GLUCAP 84 129* 139*    Medical History: Past Medical History  Diagnosis Date  . Diabetes mellitus type 1     (prior saw Dr. Lodema Hong endo, would like to establish locally)  . Generalized headaches     frequent  . Hypothyroidism   . Convulsions/seizures     unknown type, nml EEG  . History of chicken pox   . Anemia   . Vitamin B12 deficiency 03/2012    normal IF    Medications:  Scheduled:  . antiseptic oral rinse  7 mL Mouth Rinse BID  . heparin  5,000 Units  Subcutaneous 3 times per day  . levothyroxine  100 mcg Oral QAC breakfast  . ofloxacin  1 drop Both Eyes QID  . piperacillin-tazobactam (ZOSYN)  IV  3.375 g Intravenous 3 times per day  . sodium chloride  3 mL Intravenous Q12H  . vancomycin  750 mg Intravenous Q24H   Infusions:  . dextrose 5 % and 0.9% NaCl 100 mL/hr at 02/26/2015 2145  . insulin (NOVOLIN-R) infusion Stopped (01/31/15 0351)  . norepinephrine 7 mcg/min (01/31/15 0545)   PRN: acetaminophen **OR** acetaminophen, ondansetron **OR** ondansetron (ZOFRAN) IV, sodium chloride, sodium chloride  Assessment: Phamracy consulted to assist in managing electrolytes in this 69 y/o F with DKA. Mag and potassium are both low today.   Plan:  Will order magnesium sulfate 4 g iv once and potassium chloride 10 mEq IV x 4 and  f/u am labs.   Carola Frost, Pharm.D.  Clinical Pharmacist 01/31/2015,6:27 AM

## 2015-01-31 NOTE — Progress Notes (Addendum)
ENDOCRINOLOGY Follow up  REFERRING PHYSICIAN: Auburn Bilberry, MD CONSULTING PHYSICIAN:  Doylene Canning, MD  CHIEF COMPLAINT: Type 1 DM on insulin pump  HPI: 69 y.o. female seen in consultation for diabetes mellitus, type 1 on insulin pump therapy admitted with septic shock and DKA.   24  Hr events:  She has remained hypotensive with low urine output. She was started on levophed. She continues on IV insulin. Insulin gtt had to be held several times due to lower blood sugars, nadir was 69 overnight. She is more awake today but still unable to answer questions in a coherent way. Her RN is concerned that she is not swallowing properly, she had difficulty with the levothyroxine this am.   CURRENT MEDICATIONS:  . antiseptic oral rinse  7 mL Mouth Rinse BID  . heparin  5,000 Units Subcutaneous 3 times per day  . levothyroxine  100 mcg Oral QAC breakfast  . ofloxacin  1 drop Both Eyes QID  . piperacillin-tazobactam (ZOSYN)  IV  3.375 g Intravenous 3 times per day  . sodium chloride  3 mL Intravenous Q12H  . vancomycin  750 mg Intravenous Q24H    ALLERGIES:  No Known Allergies   PHYSICAL EXAMINATION:  BP 94/56 mmHg  Pulse 91  Temp(Src) 97.6 F (36.4 C) (Oral)  Resp 30  Ht  (1.549 m)  Wt 54.3 kg (119 lb 11.4 oz)  BMI 22.63 kg/m2  SpO2 97%  GENERAL: somnolent but more arousable, in NAD. HEENT:  Dry mucus membranes CARDIAC:  Regular rate and rhythm  PULMONARY:  Clear to auscultation anteriorly.  ABDOMEN:  Diffusely soft, nontender, nondistended, normoactive bowel sounds.  EXTREMITIES:  No peripheral edema is present, 2+ pedal pulses bilaterally, no ulcerations.    SKIN:  Warm, dry.  LABORATORY DATA:  01/17/15 -- Hgb A1c 10.7%  Results for orders placed or performed during the hospital encounter of 02/28/2015 (from the past 24 hour(s))  Glucose, capillary     Status: Abnormal   Collection Time: 02/08/2015  1:27 PM  Result Value Ref Range   Glucose-Capillary 137 (H) 65 - 99  mg/dL  Glucose, capillary     Status: Abnormal   Collection Time: 02/12/2015  2:28 PM  Result Value Ref Range   Glucose-Capillary 154 (H) 65 - 99 mg/dL  Cortisol     Status: None   Collection Time: 02/02/2015  2:33 PM  Result Value Ref Range   Cortisol, Plasma 5.4 ug/dL  Basic metabolic panel     Status: Abnormal   Collection Time: 02/03/2015  2:33 PM  Result Value Ref Range   Sodium 128 (L) 135 - 145 mmol/L   Potassium 3.6 3.5 - 5.1 mmol/L   Chloride 103 101 - 111 mmol/L   CO2 17 (L) 22 - 32 mmol/L   Glucose, Bld 147 (H) 65 - 99 mg/dL   BUN 11 6 - 20 mg/dL   Creatinine, Ser 5.36 (H) 0.44 - 1.00 mg/dL   Calcium 7.9 (L) 8.9 - 10.3 mg/dL   GFR calc non Af Amer 42 (L) >60 mL/min   GFR calc Af Amer 49 (L) >60 mL/min   Anion gap 8 5 - 15  Blood gas, arterial     Status: Abnormal   Collection Time: 02/05/2015  3:28 PM  Result Value Ref Range   FIO2 0.21    pH, Arterial 7.32 (L) 7.350 - 7.450   pCO2 arterial 29 (L) 32.0 - 48.0 mmHg   pO2, Arterial 72 (L) 83.0 - 108.0  mmHg   Bicarbonate 14.9 (L) 21.0 - 28.0 mEq/L   Acid-base deficit 9.8 (H) 0.0 - 2.0 mmol/L   O2 Saturation 92.8 %   Patient temperature 37.0    Collection site LEFT BRACHIAL    Sample type ARTERIAL DRAW    Allens test (pass/fail) POSITIVE (A) PASS  Glucose, capillary     Status: Abnormal   Collection Time: 02/17/2015  3:29 PM  Result Value Ref Range   Glucose-Capillary 150 (H) 65 - 99 mg/dL  Glucose, capillary     Status: Abnormal   Collection Time: 02/13/2015  4:31 PM  Result Value Ref Range   Glucose-Capillary 186 (H) 65 - 99 mg/dL  Glucose, capillary     Status: Abnormal   Collection Time: 02/24/2015  5:33 PM  Result Value Ref Range   Glucose-Capillary 172 (H) 65 - 99 mg/dL  Basic metabolic panel     Status: Abnormal   Collection Time: 02/26/2015  6:47 PM  Result Value Ref Range   Sodium 127 (L) 135 - 145 mmol/L   Potassium 3.8 3.5 - 5.1 mmol/L   Chloride 102 101 - 111 mmol/L   CO2 16 (L) 22 - 32 mmol/L   Glucose, Bld  199 (H) 65 - 99 mg/dL   BUN 11 6 - 20 mg/dL   Creatinine, Ser 2.13 (H) 0.44 - 1.00 mg/dL   Calcium 7.7 (L) 8.9 - 10.3 mg/dL   GFR calc non Af Amer 39 (L) >60 mL/min   GFR calc Af Amer 45 (L) >60 mL/min   Anion gap 9 5 - 15  Glucose, capillary     Status: Abnormal   Collection Time: 02/27/2015  6:51 PM  Result Value Ref Range   Glucose-Capillary 191 (H) 65 - 99 mg/dL  Glucose, capillary     Status: Abnormal   Collection Time: 02/02/2015  7:55 PM  Result Value Ref Range   Glucose-Capillary 204 (H) 65 - 99 mg/dL  Glucose, capillary     Status: Abnormal   Collection Time: 02/19/2015  8:53 PM  Result Value Ref Range   Glucose-Capillary 205 (H) 65 - 99 mg/dL  Basic metabolic panel (stat then every 4 hours)     Status: Abnormal   Collection Time: 02/24/2015  9:08 PM  Result Value Ref Range   Sodium 126 (L) 135 - 145 mmol/L   Potassium 3.5 3.5 - 5.1 mmol/L   Chloride 102 101 - 111 mmol/L   CO2 17 (L) 22 - 32 mmol/L   Glucose, Bld 204 (H) 65 - 99 mg/dL   BUN 11 6 - 20 mg/dL   Creatinine, Ser 0.86 (H) 0.44 - 1.00 mg/dL   Calcium 7.3 (L) 8.9 - 10.3 mg/dL   GFR calc non Af Amer 34 (L) >60 mL/min   GFR calc Af Amer 39 (L) >60 mL/min   Anion gap 7 5 - 15  Glucose, capillary     Status: Abnormal   Collection Time: 02/14/2015 10:29 PM  Result Value Ref Range   Glucose-Capillary 154 (H) 65 - 99 mg/dL  Glucose, capillary     Status: None   Collection Time: 01/31/15 12:22 AM  Result Value Ref Range   Glucose-Capillary 99 65 - 99 mg/dL  Glucose, capillary     Status: Abnormal   Collection Time: 01/31/15  1:27 AM  Result Value Ref Range   Glucose-Capillary 103 (H) 65 - 99 mg/dL  Glucose, capillary     Status: None   Collection Time: 01/31/15  2:09 AM  Result Value Ref Range   Glucose-Capillary 67 65 - 99 mg/dL  Glucose, capillary     Status: None   Collection Time: 01/31/15  2:12 AM  Result Value Ref Range   Glucose-Capillary 69 65 - 99 mg/dL  Glucose, capillary     Status: None   Collection  Time: 01/31/15  2:45 AM  Result Value Ref Range   Glucose-Capillary 83 65 - 99 mg/dL  Glucose, capillary     Status: None   Collection Time: 01/31/15  3:49 AM  Result Value Ref Range   Glucose-Capillary 84 65 - 99 mg/dL  Glucose, capillary     Status: Abnormal   Collection Time: 01/31/15  4:44 AM  Result Value Ref Range   Glucose-Capillary 129 (H) 65 - 99 mg/dL  Basic metabolic panel     Status: Abnormal   Collection Time: 01/31/15  5:20 AM  Result Value Ref Range   Sodium 128 (L) 135 - 145 mmol/L   Potassium 3.4 (L) 3.5 - 5.1 mmol/L   Chloride 108 101 - 111 mmol/L   CO2 15 (L) 22 - 32 mmol/L   Glucose, Bld 166 (H) 65 - 99 mg/dL   BUN 9 6 - 20 mg/dL   Creatinine, Ser 8.41 (H) 0.44 - 1.00 mg/dL   Calcium 6.8 (L) 8.9 - 10.3 mg/dL   GFR calc non Af Amer 35 (L) >60 mL/min   GFR calc Af Amer 41 (L) >60 mL/min   Anion gap 5 5 - 15  CBC     Status: Abnormal   Collection Time: 01/31/15  5:20 AM  Result Value Ref Range   WBC 21.0 (H) 3.6 - 11.0 K/uL   RBC 3.47 (L) 3.80 - 5.20 MIL/uL   Hemoglobin 9.5 (L) 12.0 - 16.0 g/dL   HCT 32.4 (L) 40.1 - 02.7 %   MCV 84.0 80.0 - 100.0 fL   MCH 27.3 26.0 - 34.0 pg   MCHC 32.5 32.0 - 36.0 g/dL   RDW 25.3 66.4 - 40.3 %   Platelets 260 150 - 440 K/uL  Magnesium     Status: Abnormal   Collection Time: 01/31/15  5:20 AM  Result Value Ref Range   Magnesium 1.6 (L) 1.7 - 2.4 mg/dL  Phosphorus     Status: None   Collection Time: 01/31/15  5:20 AM  Result Value Ref Range   Phosphorus 2.7 2.5 - 4.6 mg/dL  Glucose, capillary     Status: Abnormal   Collection Time: 01/31/15  5:38 AM  Result Value Ref Range   Glucose-Capillary 139 (H) 65 - 99 mg/dL  Glucose, capillary     Status: Abnormal   Collection Time: 01/31/15  6:42 AM  Result Value Ref Range   Glucose-Capillary 172 (H) 65 - 99 mg/dL  Glucose, capillary     Status: Abnormal   Collection Time: 01/31/15  7:02 AM  Result Value Ref Range   Glucose-Capillary 190 (H) 65 - 99 mg/dL  Glucose,  capillary     Status: Abnormal   Collection Time: 01/31/15  7:48 AM  Result Value Ref Range   Glucose-Capillary 211 (H) 65 - 99 mg/dL  Glucose, capillary     Status: Abnormal   Collection Time: 01/31/15  8:43 AM  Result Value Ref Range   Glucose-Capillary 195 (H) 65 - 99 mg/dL  Glucose, capillary     Status: Abnormal   Collection Time: 01/31/15  9:48 AM  Result Value Ref Range   Glucose-Capillary 161 (H) 65 - 99  mg/dL  Glucose, capillary     Status: Abnormal   Collection Time: 01/31/15 10:45 AM  Result Value Ref Range   Glucose-Capillary 115 (H) 65 - 99 mg/dL    ASSESSMENT:  1. DKA - resolved 2. Uncontrolled type 1 diabetes on insulin pump 3. Acute renal failure due to dehydration  4. Septic shock 5. Hypothyroidism   PLAN: 1. Given her altered mental status and requirement of pressors, would continue IV insulin per protocol 2. Would consider transitioning to subQ insulin shots rather than insulin pump when appropriate 3. Switch from oral to IV levothyroxine 50 mcg daily until mental status improves and swallowing ability ability is assessed  Will follow along with you.  Thank you for this consult.   Doylene Canning, MD [facility] Endocrinology

## 2015-01-31 NOTE — Care Management Note (Signed)
Case Management Note  Patient Details  Name: [name] MRN: [id number] Date of Birth: 10/17/1945  Subjective/Objective:   Recently at [facility] for DKA (discharge 08/25). Sent to [facility], developed hypotension, readmitted and  found to be in DKA. Currently on Insulin gtt. CSW consult initiated. Will monitor progress.                Action/Plan:   Expected Discharge Date:                  Expected Discharge Plan:  Skilled Nursing Facility  In-House Referral:  Clinical Social Work  Discharge planning Services     Post Acute Care Choice:    Choice offered to:     DME Arranged:    DME Agency:     HH Arranged:    HH Agency:     Status of Service:  In process, will continue to follow  Medicare Important Message Given:    Date Medicare IM Given:    Medicare IM give by:    Date Additional Medicare IM Given:    Additional Medicare Important Message give by:     If discussed at Long Length of Stay Meetings, dates discussed:    Additional Comments:  Marily Memos, RN 01/31/2015, 8:58 AM

## 2015-01-31 NOTE — Progress Notes (Signed)
Spoke with Dr. Betti Cruz about Pt's BS of 69 and then 83 after 12ml D50 per DKA protocol. Insulin drip restarted at .2units/hr and Dr. Betti Cruz informed. No new orders given. Will continue to monitor.

## 2015-01-31 NOTE — Progress Notes (Signed)
[facility] - [place] at [facility]                                                                                                                                                                                            Patient Demographics   [name], is a 69 y.o. female, DOB - [birth year], ZOX:[id number]  Admit date - 2015/02/12   Admitting Physician Oralia Manis, MD  Outpatient Primary MD for the patient is Lauro Regulus., MD   LOS - 1  Subjective: Patient was recently hospitalized with nausea vomiting and DKA. He was discharged to rehabilitation. Patient at the skilled nursing facility was noted to have hypotension. She was brought to the emergency room and was noted to be very lethargic and very weak. Patient was admitted with DKA because of her anion gap being 18. And lactic acid being 2.5 she was thought to have sepsis.  Lethargic. Hypotensive. On Levophed drip, insulin drip.   Review of Systems:   Limited due to patient being lethargic   Vitals:   Filed Vitals:   01/31/15 0700 01/31/15 0800 01/31/15 0900 01/31/15 1000  BP: 78/48 108/56 107/59 94/56  Pulse: 97 96 92 91  Temp:  97.6 F (36.4 C)    TempSrc:  Oral    Resp: 26 32 30 30  Height:      Weight:      SpO2: 93% 98% 97% 97%    Wt Readings from Last 3 Encounters:  February 12, 2015 54.3 kg (119 lb 11.4 oz)  01/22/15 56.8 kg (125 lb 3.5 oz)  01/17/15 53.071 kg (117 lb)     Intake/Output Summary (Last 24 hours) at 01/31/15 1055 Last data filed at 01/31/15 0646  Gross per 24 hour  Intake 4293.86 ml  Output    705 ml  Net 3588.86 ml    Physical Exam:   GENERAL: Appears very weak and tired  HEAD, EYES, EARS, NOSE AND THROAT: Atraumatic, normocephalic. Extraocular muscles are intact. Pupils equal and reactive to light. Sclerae anicteric. No conjunctival injection. No oro-pharyngeal erythema.  NECK: Supple. There is no jugular venous distention. No bruits, no lymphadenopathy, no  thyromegaly.  HEART: Regular rate and rhythm,. No murmurs, no rubs, no clicks.  LUNGS: Clear to auscultation bilaterally. No rales or rhonchi. No wheezes.  ABDOMEN: Soft, flat, nontender, nondistended. Has good bowel sounds. No hepatosplenomegaly appreciated.  EXTREMITIES: No evidence of any cyanosis, clubbing, or peripheral edema.  +2 pedal and radial pulses bilaterally.  NEUROLOGIC: Patient is sleepy but able to move all extremities no focal deficit appreciated SKIN: Moist and warm with no rashes appreciated.  Psych:  Not anxious, depressed LN: No inguinal LN enlargement    Antibiotics   Anti-infectives    Start     Dose/Rate Route Frequency Ordered Stop   01/31/15 1600  vancomycin (VANCOCIN) IVPB 750 mg/150 ml premix  Status:  Discontinued     750 mg 150 mL/hr over 60 Minutes Intravenous Every 36 hours 02/20/15 0516 2015-02-20 1228   01/31/15 0400  vancomycin (VANCOCIN) IVPB 750 mg/150 ml premix     750 mg 150 mL/hr over 60 Minutes Intravenous Every 24 hours 2015/02/20 1228     20-Feb-2015 0600  piperacillin-tazobactam (ZOSYN) IVPB 3.375 g     3.375 g 12.5 mL/hr over 240 Minutes Intravenous 3 times per day 2015/02/20 0516     2015/02/20 0345  vancomycin (VANCOCIN) IVPB 1000 mg/200 mL premix     1,000 mg 200 mL/hr over 60 Minutes Intravenous  Once 02-20-2015 0331 20-Feb-2015 0506   20-Feb-2015 0345  piperacillin-tazobactam (ZOSYN) IVPB 3.375 g  Status:  Discontinued     3.375 g 12.5 mL/hr over 240 Minutes Intravenous  Once Feb 20, 2015 0331 02-20-15 0745      Medications   Scheduled Meds: . antiseptic oral rinse  7 mL Mouth Rinse BID  . heparin  5,000 Units Subcutaneous 3 times per day  . levothyroxine  100 mcg Oral QAC breakfast  . ofloxacin  1 drop Both Eyes QID  . piperacillin-tazobactam (ZOSYN)  IV  3.375 g Intravenous 3 times per day  . potassium chloride  10 mEq Intravenous Q1 Hr x 4  . sodium chloride  3 mL Intravenous Q12H  . vancomycin  750 mg Intravenous Q24H   Continuous  Infusions: . dextrose 5 % and 0.9% NaCl 100 mL/hr at 01/31/15 0646  . insulin (NOVOLIN-R) infusion 2 Units/hr (01/31/15 0955)  . norepinephrine 7 mcg/min (01/31/15 0545)   PRN Meds:.acetaminophen **OR** acetaminophen, ondansetron **OR** ondansetron (ZOFRAN) IV, sodium chloride, sodium chloride   Data Review:   Micro Results Recent Results (from the past 240 hour(s))  Urine culture     Status: None   Collection Time: 01/22/15 12:55 PM  Result Value Ref Range Status   Specimen Description URINE, RANDOM  Final   Special Requests NONE  Final   Culture NO GROWTH 1 DAY  Final   Report Status 01/23/2015 FINAL  Final  Culture, blood (routine x 2)     Status: None (Preliminary result)   Collection Time: 02/20/15  3:34 AM  Result Value Ref Range Status   Specimen Description BLOOD BLOOD RIGHT FOREARM  Final   Special Requests BOTTLES DRAWN AEROBIC AND ANAEROBIC  Final   Culture  Setup Time   Final    GRAM POSITIVE RODS ANAEROBIC RBV BRITTANY RUDD ON 2015/02/20 AT 1752 BY TB CONFIRMED BY TB/MS.    Culture   Final    BACILLUS SPECIES ANAEROBIC BOTTLE ONLY Results consistent with contamination.    Report Status PENDING  Incomplete  Culture, blood (routine x 2)     Status: None (Preliminary result)   Collection Time: 02/20/15  3:34 AM  Result Value Ref Range Status   Specimen Description BLOOD RIGHT HAND  Final   Special Requests BOTTLES DRAWN AEROBIC AND ANAEROBIC  Final   Culture NO GROWTH 1 DAY  Final   Report Status PENDING  Incomplete  MRSA PCR Screening     Status: None   Collection Time: 02-20-2015  5:15 AM  Result Value Ref Range Status   MRSA by PCR NEGATIVE NEGATIVE Final  Comment:        The GeneXpert MRSA Assay (FDA approved for NASAL specimens only), is one component of a comprehensive MRSA colonization surveillance program. It is not intended to diagnose MRSA infection nor to guide or monitor treatment for MRSA infections.     Radiology Reports Dg  Chest 2 View  01/19/2015   CLINICAL DATA:  Status post left fourth through sixth rib fractures 01/17/2015 after a fall. Vomiting and diarrhea today. Subsequent encounter.  EXAM: CHEST  2 VIEW  COMPARISON:  Plain films of the chest and left ribs 01/17/2015. PA and lateral chest 05/15/2013. CT chest 01/06/2010.  FINDINGS: Left fourth through seventh rib fractures are noted. No new fracture is identified. The lungs are clear. No pneumothorax or pleural effusion is identified. Heart size is normal. Aortic atherosclerosis is noted.  IMPRESSION: Left fourth through seventh rib fractures. Negative for pneumothorax or acute cardiopulmonary disease.   Electronically Signed   By: Drusilla Kanner M.D.   On: 01/19/2015 14:14   Dg Ribs Unilateral W/chest Left  01/17/2015   CLINICAL DATA:  Status post fall today complaining of left posterior rib pain.  EXAM: LEFT RIBS AND CHEST - 3+ VIEW  COMPARISON:  May 15, 2013  FINDINGS: There are displaced fractures of the left fourth, fifth, sixth, seventh ribs. There questioned fractures of the left second and third ribs. There is no pneumothorax. The lungs are clear. The mediastinal contour and cardiac silhouette are normal.  IMPRESSION: Fractures of left ribs as described.   Electronically Signed   By: Sherian Rein M.D.   On: 01/17/2015 16:53   Dg Thoracic Spine 2 View  01/17/2015   CLINICAL DATA:  Patient status post fall while walking up the stairs. Left posterior rib and thoracic spine pain worse when breathing.  EXAM: THORACIC SPINE 2 VIEWS  COMPARISON:  Chest radiograph 05/15/2013  FINDINGS: Superior thoracic vertebral bodies are not well visualized. The mid and lower thoracic vertebral bodies are visualized and appear in normal height with normal intervertebral disc spaces. There is rightward curvature of the proximal thoracic spine demonstrated on the AP view. Multiple left-sided rib fractures, incompletely evaluated on current exam.  IMPRESSION: Rightward curvature  of the proximal thoracic spine. Recommend correlation for point tenderness at this location.  Otherwise relative preservation the vertebral body heights.  Incompletely visualized probable left rib fractures. See dedicated chest and rib report.   Electronically Signed   By: Annia Belt M.D.   On: 01/17/2015 16:52   Ct Head Wo Contrast  01/19/2015   CLINICAL DATA:  Intractable vomiting over the last few days.  EXAM: CT HEAD WITHOUT CONTRAST  TECHNIQUE: Contiguous axial images were obtained from the base of the skull through the vertex without intravenous contrast.  COMPARISON:  Head CT 01/17/2015  FINDINGS: Brainstem is normal. Chronically known 1 cm lesion of the left cerebellum is again visible, consistent with a chronic cavernous angioma. Some adjacent brain low density is present as has been seen previously. No sign that there is any active process in this region. The cerebral hemispheres are normal. No evidence of old or acute infarction, mass lesion, hemorrhage, hydrocephalus or extra-axial collection. Chronic benign left calvarial lucency likely related to a hemangioma. No acute calvarial finding. Sinuses, middle ears and mastoids are clear.  IMPRESSION: No acute intracranial finding. 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 Based on the unchanged appearance, this would be on likely to relate to the clinical presentation.   Electronically  Signed   By: Paulina Fusi M.D.   On: 01/19/2015 13:31   Ct Head Wo Contrast  01/17/2015   CLINICAL DATA:  Larey Seat downstairs today.  Hit head.  EXAM: CT HEAD WITHOUT CONTRAST  CT CERVICAL SPINE WITHOUT CONTRAST  TECHNIQUE: Multidetector CT imaging of the head and cervical spine was performed following the standard protocol without intravenous contrast. Multiplanar CT image reconstructions of the cervical spine were also generated.  COMPARISON:  Head CT 08/14/2009  FINDINGS: CT HEAD FINDINGS  The ventricles are normal in size and  configuration. No extra-axial fluid collections are identified. The gray-white differentiation is normal. No CT findings for acute intracranial process such as hemorrhage or infarction. No mass lesions. The brainstem and cerebellum are grossly normal.  The bony structures are intact. The paranasal sinuses and mastoid air cells are clear. The globes are intact.  CT CERVICAL SPINE FINDINGS  Degenerative cervical spondylosis with mild multilevel disc disease and facet disease. The cervical vertebral bodies are normally aligned. No acute fracture. The facets are normally aligned. Moderate C1-2 degenerative changes but no dens fracture. The spinal canal is quite generous. No spinal or significant foraminal stenosis. The lung apices are clear.  IMPRESSION: 1. No acute intracranial findings or skull fracture. 2. Mild degenerative cervical spondylosis but no acute cervical spine fracture.   Electronically Signed   By: Rudie Meyer M.D.   On: 01/17/2015 15:21   Ct Cervical Spine Wo Contrast  01/17/2015   CLINICAL DATA:  Larey Seat downstairs today.  Hit head.  EXAM: CT HEAD WITHOUT CONTRAST  CT CERVICAL SPINE WITHOUT CONTRAST  TECHNIQUE: Multidetector CT imaging of the head and cervical spine was performed following the standard protocol without intravenous contrast. Multiplanar CT image reconstructions of the cervical spine were also generated.  COMPARISON:  Head CT 08/14/2009  FINDINGS: CT HEAD FINDINGS  The ventricles are normal in size and configuration. No extra-axial fluid collections are identified. The gray-white differentiation is normal. No CT findings for acute intracranial process such as hemorrhage or infarction. No mass lesions. The brainstem and cerebellum are grossly normal.  The bony structures are intact. The paranasal sinuses and mastoid air cells are clear. The globes are intact.  CT CERVICAL SPINE FINDINGS  Degenerative cervical spondylosis with mild multilevel disc disease and facet disease. The cervical  vertebral bodies are normally aligned. No acute fracture. The facets are normally aligned. Moderate C1-2 degenerative changes but no dens fracture. The spinal canal is quite generous. No spinal or significant foraminal stenosis. The lung apices are clear.  IMPRESSION: 1. No acute intracranial findings or skull fracture. 2. Mild degenerative cervical spondylosis but no acute cervical spine fracture.   Electronically Signed   By: Rudie Meyer M.D.   On: 01/17/2015 15:21   Dg Chest Port 1 View  01/31/2015   Ricarda Frame, MD     01/31/2015  1:33 AM Central Venous Catheter Insertion Procedure Note DARIYAH GARDUNO [id number] 03/08/1946  Procedure: Insertion of Central Venous Catheter Indications: Drug and/or fluid administration  Procedure Details Consent: Risks of procedure as well as the alternatives and risks  of each were explained to the (patient/caregiver).  Consent for  procedure obtained. Time Out: Verified patient identification, verified procedure,  site/side was marked, verified correct patient position, special  equipment/implants available, medications/allergies/relevent  history reviewed, required imaging and test results available.   Performed  Maximum sterile technique was used including antiseptics, cap,  gloves, gown, hand hygiene, mask and sheet. Skin prep: Chlorhexidine; local anesthetic administered  A antimicrobial bonded/coated triple lumen catheter was placed in  the right internal jugular vein using the Seldinger technique.  Evaluation Blood flow good Complications: No apparent complications Patient did tolerate procedure well. Chest X-ray ordered to verify placement.  CXR: pending.  Ricarda Frame 01/31/2015, 1:32 AM    Dg Chest Port 1 View  02/11/2015   CLINICAL DATA:  Acute onset of generalized weakness. Initial encounter.  EXAM: PORTABLE CHEST - 1 VIEW  COMPARISON:  Chest radiograph performed 01/19/2015  FINDINGS: The lungs are well-aerated. Minimal bilateral atelectasis is noted. There is  no evidence of pleural effusion or pneumothorax.  The cardiomediastinal silhouette is within normal limits. No acute osseous abnormalities are seen.  IMPRESSION: Minimal bilateral atelectasis noted.  Lungs otherwise clear.   Electronically Signed   By: Roanna Raider M.D.   On: 02/25/2015 02:33   Ct Renal Stone Study  01/19/2015   CLINICAL DATA:  Severe vomiting and hypotension.  EXAM: CT ABDOMEN AND PELVIS WITHOUT CONTRAST  TECHNIQUE: Multidetector CT imaging of the abdomen and pelvis was performed following the standard protocol without IV contrast.  COMPARISON:  Abdomen CT 07/05/2005  FINDINGS: Lower chest: No acute findings.  Hepatobiliary: No mass visualized on this unenhanced exam. Gallbladder is unremarkable.  Pancreas: No mass or inflammatory process visualized on this unenhanced exam. Diffuse fatty replacement of pancreas noted.  Spleen:  Within normal limits in size.  Adrenal Glands:  No masses identified.  Kidneys/Urinary tract: No evidence of urolithiasis or hydronephrosis.  Stomach/Bowel/Peritoneum: Sigmoid diverticulosis is noted. No evidence of diverticulitis or other inflammatory process. Normal appendix visualized.  Vascular/Lymphatic: No pathologically enlarged lymph nodes identified. No abdominal aortic aneurysm or other significant retroperitoneal abnormality demonstrated.  Reproductive:  No mass or other significant abnormality noted.  Other:  None.  Musculoskeletal:  No suspicious bone lesions identified.  IMPRESSION: Sigmoid diverticulosis. No radiographic evidence of diverticulitis or other acute findings.   Electronically Signed   By: Myles Rosenthal M.D.   On: 01/19/2015 14:14     CBC  Recent Labs Lab 02/14/2015 0214 01/31/15 0520  WBC 13.1* 21.0*  HGB 11.2* 9.5*  HCT 34.5* 29.1*  PLT 356 260  MCV 86.1 84.0  MCH 27.9 27.3  MCHC 32.4 32.5  RDW 14.3 14.2  LYMPHSABS 1.5  --   MONOABS 0.8  --   EOSABS 0.8*  --   BASOSABS 0.1  --     Chemistries   Recent Labs Lab  02/25/2015 0214 02/27/2015 0715 02/04/2015 1433 02/04/2015 1847 02/18/2015 2108 01/31/15 0520  NA 126* 129* 128* 127* 126* 128*  K 3.9 3.6 3.6 3.8 3.5 3.4*  CL 94* 103 103 102 102 108  CO2 14* 16* 17* 16* 17* 15*  GLUCOSE 312* 198* 147* 199* 204* 166*  BUN 17 14 11 11 11 9   CREATININE 1.99* 1.40* 1.28* 1.37* 1.53* 1.49*  CALCIUM 8.9 7.9* 7.9* 7.7* 7.3* 6.8*  MG 1.6*  --   --   --   --  1.6*  AST 31  --   --   --   --   --   ALT 13*  --   --   --   --   --   ALKPHOS 155*  --   --   --   --   --   BILITOT 1.2  --   --   --   --   --    ------------------------------------------------------------------------------------------------------------------ estimated creatinine clearance is 27.6 mL/min (by C-G formula based on Cr  of 1.49). ------------------------------------------------------------------------------------------------------------------  Recent Labs  February 23, 2015 0214  HGBA1C 9.9*   ------------------------------------------------------------------------------------------------------------------ No results for input(s): CHOL, HDL, LDLCALC, TRIG, CHOLHDL, LDLDIRECT in the last 72 hours. ------------------------------------------------------------------------------------------------------------------ No results for input(s): TSH, T4TOTAL, T3FREE, THYROIDAB in the last 72 hours.  Invalid input(s): FREET3 ------------------------------------------------------------------------------------------------------------------ No results for input(s): VITAMINB12, FOLATE, FERRITIN, TIBC, IRON, RETICCTPCT in the last 72 hours.  Coagulation profile No results for input(s): INR, PROTIME in the last 168 hours.  No results for input(s): DDIMER in the last 72 hours.  Cardiac Enzymes  Recent Labs Lab 02-23-2015 0214  TROPONINI 0.05*   ------------------------------------------------------------------------------------------------------------------ Invalid input(s): POCBNP    Assessment & Plan    #1 DKA (diabetic ketoacidoses) - as per endocrinology recommendation continue insulin drip.  #2 Dehydration - due to poor by mouth intake IV fluids #3 septic shock: Unknown source at this time continue vancomycin, Zosyn, pressor. will do a plain CT of abdomen. Value at the source. #4 ARF (acute renal failure) - due to profound dehydration, aggressive fluids as above and monitor serum creatinine had hyponatremia, acidosis with bicarbonate of only 15. #5recent ribs fracture- when necessary pain control, incentive spirometry try to avoid narcotics which may be playing a role in her lethargy and weakness #6 Hypothyroidism - continue levo thyroxine     Code Status Orders        Start     Ordered   2015/02/23 0505  Full code   Continuous     Feb 23, 2015 0504           Consults endocrinology   DVT Prophylaxis  heparin  Lab Results  Component Value Date   PLT 260 01/31/2015     Time Spent in minutes  45 minutes of critical care time patient still on insulin drip blood glucose and needs to be monitored closely Jadian Karman M.D on 01/31/2015 at 10:55 AM  Between 7am to 6pm - Pager - [phone]  After 6pm go to [email or web address] - password [email or web address] [facility]  [facility]   Office  [phone]

## 2015-01-31 NOTE — Progress Notes (Signed)
ANTIBIOTIC CONSULT NOTE - INITIAL  Pharmacy Consult for vancomycin/Zosyn Indication: PNA  No Known Allergies  Patient Measurements: Height:  (154.9 cm) Weight: 119 lb 11.4 oz (54.3 kg) IBW/kg (Calculated) : 47.8 Adjusted Body Weight: 50 kg  Vital Signs: Temp: 97.6 F (36.4 C) (09/02 1600) Temp Source: Axillary (09/02 1600) BP: 89/60 mmHg (09/02 2100) Pulse Rate: 93 (09/02 2100) Intake/Output from previous day: 09/01 0701 - 09/02 0700 In: 4625.2 [I.V.:2268.5; IV Piggyback:2356.7] Out: 705 [Urine:705] Intake/Output from this shift: Total I/O In: 262.1 [I.V.:262.1] Out: 75 [Urine:75]  Labs:  Recent Labs  02/14/2015 0214  02/14/2015 1847 02/07/2015 2108 01/31/15 0520  WBC 13.1*  --   --   --  21.0*  HGB 11.2*  --   --   --  9.5*  PLT 356  --   --   --  260  CREATININE 1.99*  < > 1.37* 1.53* 1.49*  < > = values in this interval not displayed. Estimated Creatinine Clearance: 27.6 mL/min (by C-G formula based on Cr of 1.49). No results for input(s): VANCOTROUGH, VANCOPEAK, VANCORANDOM, GENTTROUGH, GENTPEAK, GENTRANDOM, TOBRATROUGH, TOBRAPEAK, TOBRARND, AMIKACINPEAK, AMIKACINTROU, AMIKACIN in the last 72 hours.   Microbiology: Recent Results (from the past 720 hour(s))  MRSA PCR Screening     Status: None   Collection Time: 01/20/15  1:15 PM  Result Value Ref Range Status   MRSA by PCR NEGATIVE NEGATIVE Final    Comment:        The GeneXpert MRSA Assay (FDA approved for NASAL specimens only), is one component of a comprehensive MRSA colonization surveillance program. It is not intended to diagnose MRSA infection nor to guide or monitor treatment for MRSA infections.   Urine culture     Status: None   Collection Time: 01/22/15 12:55 PM  Result Value Ref Range Status   Specimen Description URINE, RANDOM  Final   Special Requests NONE  Final   Culture NO GROWTH 1 DAY  Final   Report Status 01/23/2015 FINAL  Final  Culture, blood (routine x 2)     Status: None  (Preliminary result)   Collection Time: 02/25/2015  3:34 AM  Result Value Ref Range Status   Specimen Description BLOOD BLOOD RIGHT FOREARM  Final   Special Requests BOTTLES DRAWN AEROBIC AND ANAEROBIC  Final   Culture  Setup Time   Final    GRAM POSITIVE RODS ANAEROBIC RBV BRITTANY RUDD ON 02/18/2015 AT 1752 BY TB CONFIRMED BY TB/MS.    Culture   Final    BACILLUS SPECIES ANAEROBIC BOTTLE ONLY Results consistent with contamination.    Report Status PENDING  Incomplete  Culture, blood (routine x 2)     Status: None (Preliminary result)   Collection Time: 02/02/2015  3:34 AM  Result Value Ref Range Status   Specimen Description BLOOD RIGHT HAND  Final   Special Requests BOTTLES DRAWN AEROBIC AND ANAEROBIC  Final   Culture NO GROWTH 1 DAY  Final   Report Status PENDING  Incomplete  MRSA PCR Screening     Status: None   Collection Time: 02/27/2015  5:15 AM  Result Value Ref Range Status   MRSA by PCR NEGATIVE NEGATIVE Final    Comment:        The GeneXpert MRSA Assay (FDA approved for NASAL specimens only), is one component of a comprehensive MRSA colonization surveillance program. It is not intended to diagnose MRSA infection nor to guide or monitor treatment for MRSA infections.  Medical History: Past Medical History  Diagnosis Date  . Diabetes mellitus type 1     (prior saw Dr. Lodema Hong endo, would like to establish locally)  . Generalized headaches     frequent  . Hypothyroidism   . Convulsions/seizures     unknown type, nml EEG  . History of chicken pox   . Anemia   . Vitamin B12 deficiency 03/2012    normal IF    Medications:  Infusions:  . dextrose 5 % and 0.9% NaCl 100 mL/hr at 01/31/15 0646  . insulin (NOVOLIN-R) infusion 1.1 Units/hr (01/31/15 1806)  . norepinephrine 8 mcg/min (01/31/15 1900)   Assessment:   Pharmacy consulted to dose vancomycin and Zosyn for 69 yo female on antibiotics for sepsis. Patient is currently ordered vancomycin 750mg  IV  Q24hr and Zosyn EI 3.375g IV Q8hr.    Goal of Therapy:  Vancomycin trough level 15-20 mcg/ml  Plan:  1. Vancomycin: will continue patient on vancomycin 750mg  IV Q24hr. Will monitor renal function daily and obtain trough prior to dose on 9/4 to help guide therapy.   2. Zosyn: will continue patient on Zosyn EI 3.375g IV Q8hr.    Pharmacy will continue to monitor and adjust per consult.   Simpson,Michael L, Pharm.D. Clinical Pharmacist 01/31/2015,9:51 PM

## 2015-02-01 ENCOUNTER — Inpatient Hospital Stay: Payer: Medicare Other

## 2015-02-01 LAB — BASIC METABOLIC PANEL
ANION GAP: 5 (ref 5–15)
BUN: 8 mg/dL (ref 6–20)
CALCIUM: 6.8 mg/dL — AB (ref 8.9–10.3)
CO2: 13 mmol/L — ABNORMAL LOW (ref 22–32)
CREATININE: 1.47 mg/dL — AB (ref 0.44–1.00)
Chloride: 109 mmol/L (ref 101–111)
GFR calc Af Amer: 41 mL/min — ABNORMAL LOW (ref >60)
GFR, EST NON AFRICAN AMERICAN: 36 mL/min — AB (ref >60)
GLUCOSE: 234 mg/dL — AB (ref 65–99)
Potassium: 3.7 mmol/L (ref 3.5–5.1)
Sodium: 127 mmol/L — ABNORMAL LOW (ref 135–145)

## 2015-02-01 LAB — CBC
HCT: 25.9 % — ABNORMAL LOW (ref 35.0–47.0)
Hemoglobin: 8.6 g/dL — ABNORMAL LOW (ref 12.0–16.0)
MCH: 28.2 pg (ref 26.0–34.0)
MCHC: 33.3 g/dL (ref 32.0–36.0)
MCV: 84.6 fL (ref 80.0–100.0)
PLATELETS: 227 10*3/uL (ref 150–440)
RBC: 3.06 MIL/uL — ABNORMAL LOW (ref 3.80–5.20)
RDW: 14.6 % — AB (ref 11.5–14.5)
WBC: 16.1 10*3/uL — AB (ref 3.6–11.0)

## 2015-02-01 LAB — PHOSPHORUS: Phosphorus: 2.2 mg/dL — ABNORMAL LOW (ref 2.5–4.6)

## 2015-02-01 LAB — GLUCOSE, CAPILLARY
GLUCOSE-CAPILLARY: 104 mg/dL — AB (ref 65–99)
GLUCOSE-CAPILLARY: 105 mg/dL — AB (ref 65–99)
GLUCOSE-CAPILLARY: 131 mg/dL — AB (ref 65–99)
GLUCOSE-CAPILLARY: 138 mg/dL — AB (ref 65–99)
GLUCOSE-CAPILLARY: 142 mg/dL — AB (ref 65–99)
GLUCOSE-CAPILLARY: 144 mg/dL — AB (ref 65–99)
GLUCOSE-CAPILLARY: 159 mg/dL — AB (ref 65–99)
GLUCOSE-CAPILLARY: 165 mg/dL — AB (ref 65–99)
GLUCOSE-CAPILLARY: 189 mg/dL — AB (ref 65–99)
GLUCOSE-CAPILLARY: 192 mg/dL — AB (ref 65–99)
GLUCOSE-CAPILLARY: 206 mg/dL — AB (ref 65–99)
GLUCOSE-CAPILLARY: 215 mg/dL — AB (ref 65–99)
GLUCOSE-CAPILLARY: 233 mg/dL — AB (ref 65–99)
GLUCOSE-CAPILLARY: 239 mg/dL — AB (ref 65–99)
Glucose-Capillary: 106 mg/dL — ABNORMAL HIGH (ref 65–99)
Glucose-Capillary: 119 mg/dL — ABNORMAL HIGH (ref 65–99)
Glucose-Capillary: 128 mg/dL — ABNORMAL HIGH (ref 65–99)
Glucose-Capillary: 131 mg/dL — ABNORMAL HIGH (ref 65–99)
Glucose-Capillary: 135 mg/dL — ABNORMAL HIGH (ref 65–99)
Glucose-Capillary: 136 mg/dL — ABNORMAL HIGH (ref 65–99)
Glucose-Capillary: 142 mg/dL — ABNORMAL HIGH (ref 65–99)
Glucose-Capillary: 161 mg/dL — ABNORMAL HIGH (ref 65–99)
Glucose-Capillary: 173 mg/dL — ABNORMAL HIGH (ref 65–99)
Glucose-Capillary: 181 mg/dL — ABNORMAL HIGH (ref 65–99)
Glucose-Capillary: 212 mg/dL — ABNORMAL HIGH (ref 65–99)
Glucose-Capillary: 218 mg/dL — ABNORMAL HIGH (ref 65–99)
Glucose-Capillary: 92 mg/dL (ref 65–99)
Glucose-Capillary: 99 mg/dL (ref 65–99)

## 2015-02-01 LAB — MAGNESIUM: Magnesium: 2.2 mg/dL (ref 1.7–2.4)

## 2015-02-01 MED ORDER — METHYLPREDNISOLONE SODIUM SUCC 125 MG IJ SOLR
60.0000 mg | INTRAMUSCULAR | Status: DC
  Administered 2015-02-01 – 2015-02-04 (×4): 60 mg via INTRAVENOUS
  Filled 2015-02-01 (×4): qty 2

## 2015-02-01 MED ORDER — POTASSIUM PHOSPHATES 15 MMOLE/5ML IV SOLN
15.0000 mmol | Freq: Once | INTRAVENOUS | Status: AC
  Administered 2015-02-01: 15 mmol via INTRAVENOUS
  Filled 2015-02-01: qty 5

## 2015-02-01 MED ORDER — IPRATROPIUM-ALBUTEROL 0.5-2.5 (3) MG/3ML IN SOLN: 3.0000 mL | RESPIRATORY_TRACT | Status: DC | PRN

## 2015-02-01 MED ORDER — IPRATROPIUM-ALBUTEROL 0.5-2.5 (3) MG/3ML IN SOLN
3.0000 mL | RESPIRATORY_TRACT | Status: DC
  Administered 2015-02-01 (×3): 3 mL via RESPIRATORY_TRACT
  Filled 2015-02-01 (×4): qty 3

## 2015-02-01 MED ORDER — IPRATROPIUM-ALBUTEROL 0.5-2.5 (3) MG/3ML IN SOLN: 3.0000 mL | Freq: Four times a day (QID) | RESPIRATORY_TRACT | Status: DC

## 2015-02-01 NOTE — Progress Notes (Signed)
[facility] - [place] at [facility]                                                                                                                                                                                            Patient Demographics   [name], is a 69 y.o. female, DOB - 1945-10-19, ZOX:[id number]  Admit date - 02/14/2015   Admitting Physician Oralia Manis, MD  Outpatient Primary MD for the patient is Lauro Regulus., MD   LOS - 2  Subjective: Patient was recently hospitalized with nausea vomiting and DKA. He was discharged to rehabilitation. Patient at the skilled nursing facility was noted to have hypotension. She was brought to the emergency room and was noted to be very lethargic and very weak. Patient was admitted with DKA because of her anion gap being 18. And lactic acid being 2.5  This morning patient is having the wheezing. Also lethargic.   Review of Systems:   Limited due to patient being lethargic   Vitals:   Filed Vitals:   02/01/15 0751 02/01/15 0800 02/01/15 0900 02/01/15 1000  BP: 104/61 113/58 105/47 106/49  Pulse: 88 87 91 91  Temp: 98.8 F (37.1 C)     TempSrc: Oral     Resp: 38 35 30 19  Height:      Weight:      SpO2: 98% 97% 97% 100%    Wt Readings from Last 3 Encounters:  02/11/2015 54.3 kg (119 lb 11.4 oz)  01/22/15 56.8 kg (125 lb 3.5 oz)  01/17/15 53.071 kg (117 lb)     Intake/Output Summary (Last 24 hours) at 02/01/15 1120 Last data filed at 02/01/15 1100  Gross per 24 hour  Intake 2446.3 ml  Output   1600 ml  Net  846.3 ml    Physical Exam:   GENERAL lethargic HEAD, EYES, EARS, NOSE AND THROAT: Atraumatic, normocephalic. Extraocular muscles are intact. Pupils equal and reactive to light. Sclerae anicteric. No conjunctival injection. No oro-pharyngeal erythema.  NECK: Supple. There is no jugular venous distention. No bruits, no lymphadenopathy, no thyromegaly.  HEART: Regular rate and rhythm,.  No murmurs, no rubs, no clicks.  LUNGS: Bilateral expiratory wheeze in all lung fields. ABDOMEN: Soft, flat, nontender, nondistended. Has good bowel sounds. No hepatosplenomegaly appreciated.  EXTREMITIES: No evidence of any cyanosis, clubbing, or peripheral edema.  +2 pedal and radial pulses bilaterally.  NEUROLOGIC: Patient is sleepy but able to move all extremities no focal deficit appreciated SKIN: Moist and warm with no rashes appreciated.  Psych: Not anxious, depressed LN: No inguinal LN enlargement    Antibiotics  Anti-infectives    Start     Dose/Rate Route Frequency Ordered Stop   01/31/15 1600  vancomycin (VANCOCIN) IVPB 750 mg/150 ml premix  Status:  Discontinued     750 mg 150 mL/hr over 60 Minutes Intravenous Every 36 hours 02/11/2015 0516 02/06/2015 1228   01/31/15 0400  vancomycin (VANCOCIN) IVPB 750 mg/150 ml premix     750 mg 150 mL/hr over 60 Minutes Intravenous Every 24 hours 02/08/2015 1228     02/22/2015 0600  piperacillin-tazobactam (ZOSYN) IVPB 3.375 g     3.375 g 12.5 mL/hr over 240 Minutes Intravenous 3 times per day 02/09/2015 0516     02/14/2015 0345  vancomycin (VANCOCIN) IVPB 1000 mg/200 mL premix     1,000 mg 200 mL/hr over 60 Minutes Intravenous  Once 02/26/2015 0331 02/23/2015 0506   02/22/2015 0345  piperacillin-tazobactam (ZOSYN) IVPB 3.375 g  Status:  Discontinued     3.375 g 12.5 mL/hr over 240 Minutes Intravenous  Once 01/31/2015 0331 02/18/2015 0745      Medications   Scheduled Meds: . antiseptic oral rinse  7 mL Mouth Rinse BID  . heparin  5,000 Units Subcutaneous 3 times per day  . ipratropium-albuterol  3 mL Nebulization Q6H  . levothyroxine  50 mcg Intravenous Daily  . ofloxacin  1 drop Both Eyes QID  . piperacillin-tazobactam (ZOSYN)  IV  3.375 g Intravenous 3 times per day  . potassium phosphate IVPB (mmol)  15 mmol Intravenous Once  . sodium chloride  3 mL Intravenous Q12H  . vancomycin  750 mg Intravenous Q24H   Continuous Infusions: . dextrose 5  % and 0.9% NaCl 100 mL/hr at 02/01/15 1000  . insulin (NOVOLIN-R) infusion Stopped (02/01/15 1000)  . norepinephrine 8 mcg/min (02/01/15 1100)   PRN Meds:.acetaminophen **OR** acetaminophen, ondansetron **OR** ondansetron (ZOFRAN) IV, sodium chloride, sodium chloride   Data Review:   Micro Results Recent Results (from the past 240 hour(s))  Urine culture     Status: None   Collection Time: 01/22/15 12:55 PM  Result Value Ref Range Status   Specimen Description URINE, RANDOM  Final   Special Requests NONE  Final   Culture NO GROWTH 1 DAY  Final   Report Status 01/23/2015 FINAL  Final  Culture, blood (routine x 2)     Status: None (Preliminary result)   Collection Time: 02/26/2015  3:34 AM  Result Value Ref Range Status   Specimen Description BLOOD BLOOD RIGHT FOREARM  Final   Special Requests BOTTLES DRAWN AEROBIC AND ANAEROBIC  Final   Culture  Setup Time   Final    GRAM POSITIVE RODS ANAEROBIC RBV BRITTANY RUDD ON 02/06/2015 AT 1752 BY TB CONFIRMED BY TB/MS.    Culture   Final    BACILLUS SPECIES ANAEROBIC BOTTLE ONLY Results consistent with contamination.    Report Status PENDING  Incomplete  Culture, blood (routine x 2)     Status: None (Preliminary result)   Collection Time: 02/08/2015  3:34 AM  Result Value Ref Range Status   Specimen Description BLOOD RIGHT HAND  Final   Special Requests BOTTLES DRAWN AEROBIC AND ANAEROBIC  Final   Culture NO GROWTH 2 DAYS  Final   Report Status PENDING  Incomplete  MRSA PCR Screening     Status: None   Collection Time: 02/26/2015  5:15 AM  Result Value Ref Range Status   MRSA by PCR NEGATIVE NEGATIVE Final    Comment:        The  GeneXpert MRSA Assay (FDA approved for NASAL specimens only), is one component of a comprehensive MRSA colonization surveillance program. It is not intended to diagnose MRSA infection nor to guide or monitor treatment for MRSA infections.     Radiology Reports Dg Chest 2 View  01/19/2015    CLINICAL DATA:  Status post left fourth through sixth rib fractures 01/17/2015 after a fall. Vomiting and diarrhea today. Subsequent encounter.  EXAM: CHEST  2 VIEW  COMPARISON:  Plain films of the chest and left ribs 01/17/2015. PA and lateral chest 05/15/2013. CT chest 01/06/2010.  FINDINGS: Left fourth through seventh rib fractures are noted. No new fracture is identified. The lungs are clear. No pneumothorax or pleural effusion is identified. Heart size is normal. Aortic atherosclerosis is noted.  IMPRESSION: Left fourth through seventh rib fractures. Negative for pneumothorax or acute cardiopulmonary disease.   Electronically Signed   By: Drusilla Kanner M.D.   On: 01/19/2015 14:14   Dg Ribs Unilateral W/chest Left  01/17/2015   CLINICAL DATA:  Status post fall today complaining of left posterior rib pain.  EXAM: LEFT RIBS AND CHEST - 3+ VIEW  COMPARISON:  May 15, 2013  FINDINGS: There are displaced fractures of the left fourth, fifth, sixth, seventh ribs. There questioned fractures of the left second and third ribs. There is no pneumothorax. The lungs are clear. The mediastinal contour and cardiac silhouette are normal.  IMPRESSION: Fractures of left ribs as described.   Electronically Signed   By: Sherian Rein M.D.   On: 01/17/2015 16:53   Dg Thoracic Spine 2 View  01/17/2015   CLINICAL DATA:  Patient status post fall while walking up the stairs. Left posterior rib and thoracic spine pain worse when breathing.  EXAM: THORACIC SPINE 2 VIEWS  COMPARISON:  Chest radiograph 05/15/2013  FINDINGS: Superior thoracic vertebral bodies are not well visualized. The mid and lower thoracic vertebral bodies are visualized and appear in normal height with normal intervertebral disc spaces. There is rightward curvature of the proximal thoracic spine demonstrated on the AP view. Multiple left-sided rib fractures, incompletely evaluated on current exam.  IMPRESSION: Rightward curvature of the proximal thoracic  spine. Recommend correlation for point tenderness at this location.  Otherwise relative preservation the vertebral body heights.  Incompletely visualized probable left rib fractures. See dedicated chest and rib report.   Electronically Signed   By: Annia Belt M.D.   On: 01/17/2015 16:52   Ct Head Wo Contrast  01/31/2015   CLINICAL DATA:  Lethargy and decreased responsiveness today.  EXAM: CT HEAD WITHOUT CONTRAST  TECHNIQUE: Contiguous axial images were obtained from the base of the skull through the vertex without intravenous contrast.  COMPARISON:  CT head without contrast 01/19/2015. MRI brain 08/15/2009.  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 No acute infarct, hemorrhage, or mass lesion is otherwise present. The ventricles are of normal size. No significant extra-axial fluid collection is present. The basal ganglia are intact. The insular ribbon is intact.  The paranasal sinuses and mastoid air cells are clear.  IMPRESSION: 1. No acute intracranial abnormality or significant interval change. 2. 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   Electronically Signed   By: Marin Roberts M.D.   On: 01/31/2015 16:26   Ct Head Wo Contrast  01/19/2015   CLINICAL DATA:  Intractable vomiting over the last few days.  EXAM: CT HEAD WITHOUT CONTRAST  TECHNIQUE: Contiguous axial images were obtained  from the base of the skull through the vertex without intravenous contrast.  COMPARISON:  Head CT 01/17/2015  FINDINGS: Brainstem is normal. Chronically known 1 cm lesion of the left cerebellum is again visible, consistent with a chronic cavernous angioma. Some adjacent brain low density is present as has been seen previously. No sign that there is any active process in this region. The cerebral hemispheres are normal. No evidence of old or acute infarction, mass lesion, hemorrhage, hydrocephalus or extra-axial collection. Chronic  benign left calvarial lucency likely related to a hemangioma. No acute calvarial finding. Sinuses, middle ears and mastoids are clear.  IMPRESSION: No acute intracranial finding. 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 Based on the unchanged appearance, this would be on likely to relate to the clinical presentation.   Electronically Signed   By: Paulina Fusi M.D.   On: 01/19/2015 13:31   Ct Head Wo Contrast  01/17/2015   CLINICAL DATA:  Larey Seat downstairs today.  Hit head.  EXAM: CT HEAD WITHOUT CONTRAST  CT CERVICAL SPINE WITHOUT CONTRAST  TECHNIQUE: Multidetector CT imaging of the head and cervical spine was performed following the standard protocol without intravenous contrast. Multiplanar CT image reconstructions of the cervical spine were also generated.  COMPARISON:  Head CT 08/14/2009  FINDINGS: CT HEAD FINDINGS  The ventricles are normal in size and configuration. No extra-axial fluid collections are identified. The gray-white differentiation is normal. No CT findings for acute intracranial process such as hemorrhage or infarction. No mass lesions. The brainstem and cerebellum are grossly normal.  The bony structures are intact. The paranasal sinuses and mastoid air cells are clear. The globes are intact.  CT CERVICAL SPINE FINDINGS  Degenerative cervical spondylosis with mild multilevel disc disease and facet disease. The cervical vertebral bodies are normally aligned. No acute fracture. The facets are normally aligned. Moderate C1-2 degenerative changes but no dens fracture. The spinal canal is quite generous. No spinal or significant foraminal stenosis. The lung apices are clear.  IMPRESSION: 1. No acute intracranial findings or skull fracture. 2. Mild degenerative cervical spondylosis but no acute cervical spine fracture.   Electronically Signed   By: Rudie Meyer M.D.   On: 01/17/2015 15:21   Ct Cervical Spine Wo Contrast  01/17/2015   CLINICAL DATA:  Larey Seat  downstairs today.  Hit head.  EXAM: CT HEAD WITHOUT CONTRAST  CT CERVICAL SPINE WITHOUT CONTRAST  TECHNIQUE: Multidetector CT imaging of the head and cervical spine was performed following the standard protocol without intravenous contrast. Multiplanar CT image reconstructions of the cervical spine were also generated.  COMPARISON:  Head CT 08/14/2009  FINDINGS: CT HEAD FINDINGS  The ventricles are normal in size and configuration. No extra-axial fluid collections are identified. The gray-white differentiation is normal. No CT findings for acute intracranial process such as hemorrhage or infarction. No mass lesions. The brainstem and cerebellum are grossly normal.  The bony structures are intact. The paranasal sinuses and mastoid air cells are clear. The globes are intact.  CT CERVICAL SPINE FINDINGS  Degenerative cervical spondylosis with mild multilevel disc disease and facet disease. The cervical vertebral bodies are normally aligned. No acute fracture. The facets are normally aligned. Moderate C1-2 degenerative changes but no dens fracture. The spinal canal is quite generous. No spinal or significant foraminal stenosis. The lung apices are clear.  IMPRESSION: 1. No acute intracranial findings or skull fracture. 2. Mild degenerative cervical spondylosis but no acute cervical spine fracture.   Electronically Signed  By: Rudie Meyer M.D.   On: 01/17/2015 15:21   Dg Chest Port 1 View  01/31/2015   Ricarda Frame, MD     01/31/2015  1:33 AM Central Venous Catheter Insertion Procedure Note NANCEE BROWNRIGG [id number] 1945/08/25  Procedure: Insertion of Central Venous Catheter Indications: Drug and/or fluid administration  Procedure Details Consent: Risks of procedure as well as the alternatives and risks  of each were explained to the (patient/caregiver).  Consent for  procedure obtained. Time Out: Verified patient identification, verified procedure,  site/side was marked, verified correct patient position, special   equipment/implants available, medications/allergies/relevent  history reviewed, required imaging and test results available.   Performed  Maximum sterile technique was used including antiseptics, cap,  gloves, gown, hand hygiene, mask and sheet. Skin prep: Chlorhexidine; local anesthetic administered A antimicrobial bonded/coated triple lumen catheter was placed in  the right internal jugular vein using the Seldinger technique.  Evaluation Blood flow good Complications: No apparent complications Patient did tolerate procedure well. Chest X-ray ordered to verify placement.  CXR: pending.  Ricarda Frame 01/31/2015, 1:32 AM    Dg Chest Port 1 View  02/13/2015   CLINICAL DATA:  Acute onset of generalized weakness. Initial encounter.  EXAM: PORTABLE CHEST - 1 VIEW  COMPARISON:  Chest radiograph performed 01/19/2015  FINDINGS: The lungs are well-aerated. Minimal bilateral atelectasis is noted. There is no evidence of pleural effusion or pneumothorax.  The cardiomediastinal silhouette is within normal limits. No acute osseous abnormalities are seen.  IMPRESSION: Minimal bilateral atelectasis noted.  Lungs otherwise clear.   Electronically Signed   By: Roanna Raider M.D.   On: 02/08/2015 02:33   Ct Renal Stone Study  01/19/2015   CLINICAL DATA:  Severe vomiting and hypotension.  EXAM: CT ABDOMEN AND PELVIS WITHOUT CONTRAST  TECHNIQUE: Multidetector CT imaging of the abdomen and pelvis was performed following the standard protocol without IV contrast.  COMPARISON:  Abdomen CT 07/05/2005  FINDINGS: Lower chest: No acute findings.  Hepatobiliary: No mass visualized on this unenhanced exam. Gallbladder is unremarkable.  Pancreas: No mass or inflammatory process visualized on this unenhanced exam. Diffuse fatty replacement of pancreas noted.  Spleen:  Within normal limits in size.  Adrenal Glands:  No masses identified.  Kidneys/Urinary tract: No evidence of urolithiasis or hydronephrosis.  Stomach/Bowel/Peritoneum:  Sigmoid diverticulosis is noted. No evidence of diverticulitis or other inflammatory process. Normal appendix visualized.  Vascular/Lymphatic: No pathologically enlarged lymph nodes identified. No abdominal aortic aneurysm or other significant retroperitoneal abnormality demonstrated.  Reproductive:  No mass or other significant abnormality noted.  Other:  None.  Musculoskeletal:  No suspicious bone lesions identified.  IMPRESSION: Sigmoid diverticulosis. No radiographic evidence of diverticulitis or other acute findings.   Electronically Signed   By: Myles Rosenthal M.D.   On: 01/19/2015 14:14     CBC  Recent Labs Lab 01/31/2015 0214 01/31/15 0520 02/01/15 0508  WBC 13.1* 21.0* 16.1*  HGB 11.2* 9.5* 8.6*  HCT 34.5* 29.1* 25.9*  PLT 356 260 227  MCV 86.1 84.0 84.6  MCH 27.9 27.3 28.2  MCHC 32.4 32.5 33.3  RDW 14.3 14.2 14.6*  LYMPHSABS 1.5  --   --   MONOABS 0.8  --   --   EOSABS 0.8*  --   --   BASOSABS 0.1  --   --     Chemistries   Recent Labs Lab 02/28/2015 0214  02/05/2015 1433 01/31/2015 1847 02/12/2015 2108 01/31/15 0520 02/01/15 0508  NA 126*  < >  128* 127* 126* 128* 127*  K 3.9  < > 3.6 3.8 3.5 3.4* 3.7  CL 94*  < > 103 102 102 108 109  CO2 14*  < > 17* 16* 17* 15* 13*  GLUCOSE 312*  < > 147* 199* 204* 166* 234*  BUN 17  < > 11 11 11 9 8   CREATININE 1.99*  < > 1.28* 1.37* 1.53* 1.49* 1.47*  CALCIUM 8.9  < > 7.9* 7.7* 7.3* 6.8* 6.8*  MG 1.6*  --   --   --   --  1.6* 2.2  AST 31  --   --   --   --   --   --   ALT 13*  --   --   --   --   --   --   ALKPHOS 155*  --   --   --   --   --   --   BILITOT 1.2  --   --   --   --   --   --   < > = values in this interval not displayed. ------------------------------------------------------------------------------------------------------------------ estimated creatinine clearance is 28 mL/min (by C-G formula based on Cr of  1.47). ------------------------------------------------------------------------------------------------------------------  Recent Labs  02/05/2015 0214  HGBA1C 9.9*   ------------------------------------------------------------------------------------------------------------------ No results for input(s): CHOL, HDL, LDLCALC, TRIG, CHOLHDL, LDLDIRECT in the last 72 hours. ------------------------------------------------------------------------------------------------------------------ No results for input(s): TSH, T4TOTAL, T3FREE, THYROIDAB in the last 72 hours.  Invalid input(s): FREET3 ------------------------------------------------------------------------------------------------------------------ No results for input(s): VITAMINB12, FOLATE, FERRITIN, TIBC, IRON, RETICCTPCT in the last 72 hours.  Coagulation profile No results for input(s): INR, PROTIME in the last 168 hours.  No results for input(s): DDIMER in the last 72 hours.  Cardiac Enzymes  Recent Labs Lab 02/08/2015 0214  TROPONINI 0.05*   ------------------------------------------------------------------------------------------------------------------ Invalid input(s): POCBNP    Assessment & Plan   #1 DKA (diabetic ketoacidoses) - as per endocrinology recommendation continue insulin drip. Continue insulin drip and IV fluids because of lethargy and unable to give any diet. But adjust fluid rate secondary to wheezing. #2 Dehydration - due to poor by mouth intake IV fluids #3 septic shock: Unknown source at this time continue vancomycin, Zosyn, pressor.  #4 . Acute renal failure with hyponatremia: Nephrology was consulted secondary to persistent hyponatremia and renal failure despite aggressive hydration.  #5recent ribs fracture- when necessary pain control, incentive spirometry try to avoid narcotics which may be playing a role in her lethargy and weakness #6 Hypothyroidism - continue  Iv levothyroxine  Wheezing  likely secondary to fluid overload: Continue nebulizers, to repeat chest x-ray stat. Adjust IV fluids.  7. CODE STATUS DO NOT RESUSCITATE. Discussed this with patient's daughter did express that patient wants to be DO NOT RESUSCITATE. We will place the order in the chart.     Code Status Orders        Start     Ordered   02/06/2015 0505  Full code   Continuous     02/14/2015 0504           Consults endocrinology   DVT Prophylaxis  heparin  Lab Results  Component Value Date   PLT 227 02/01/2015     Time Spent in minutes  45 minutes of critical care time patient still on insulin drip blood glucose and needs to be monitored closely Sinai Mahany M.D on 02/01/2015 at 11:20 AM  Between 7am to 6pm - Pager - [phone]  After 6pm go to [email or web address] - password [email or web address]  [facility]   Office  [phone]

## 2015-02-01 NOTE — Progress Notes (Signed)
Pt with audible wheezes, diminished on expiration with wheezes. tachypnea 34-38 breaths per min

## 2015-02-01 NOTE — Progress Notes (Signed)
Endocrinology note:   Spoke with RN via phone. Ms. Bond's mental status is still poor. CT head yesterday was unrevealing. IV insulin has been paused at times due to lower blood sugars. She remains NPO given mental status and inability to swallow safely. Levothyroxine was changed from oral to IV yesterday. Currently she is requiring 8 mcg/hr of levophed. Urine output 60-80cc/hr via foley cath. She is tachypneic with RR 30's.   Filed Vitals:   02/01/15 0751 02/01/15 0800 02/01/15 0900 02/01/15 1000  BP: 104/61 113/58 105/47 106/49  Pulse: 88 87 91 91  Temp: 98.8 F (37.1 C)     TempSrc: Oral     Resp: 38 35 30 19  Height:      Weight:      SpO2: 98% 97% 97% 100%   BMP Latest Ref Rng 02/01/2015 01/31/2015 02/08/2015  Glucose 65 - 99 mg/dL 234(H) 166(H) 204(H)  BUN 6 - 20 mg/dL 8 9 11  Creatinine 0.44 - 1.00 mg/dL 1.47(H) 1.49(H) 1.53(H)  Sodium 135 - 145 mmol/L 127(L) 128(L) 126(L)  Potassium 3.5 - 5.1 mmol/L 3.7 3.4(L) 3.5  Chloride 101 - 111 mmol/L 109 108 102  CO2 22 - 32 mmol/L 13(L) 15(L) 17(L)  Calcium 8.9 - 10.3 mg/dL 6.8(L) 6.8(L) 7.3(L)    Recommendations:  Continue IV insulin per protocol using glucose stabilizer with goal BG 140-180 mg/dL. Would not transition to subQ insulin at this point given greater risk of hypoglycemia. Importantly, would consider options for nutrition, enteral vs parenteral. Insulin without sufficient glucose can result in ketogenesis and worsening metabolic acidosis. Her bicarb level continues to decline and she is now tachypneic. Would consider Nephrology consult. Continue IV levothyroxine 50 mcg daily.  Will follow along.  Please page if any questions/concerns [phone]  Regginald Pask, MD [facility]  

## 2015-02-01 NOTE — Plan of Care (Signed)
Problem: Phase I Progression Outcomes Goal: OOB as tolerated unless otherwise ordered Outcome: Not Met (add Reason) Pt is lethargic, confused and on bedrest Goal: Voiding-avoid urinary catheter unless indicated Outcome: Not Met (add Reason) Foley in place for urinary retention Goal: Pt. states reason for hospitalization Outcome: Not Met (add Reason) Pt lethargic with often unintelligible speech

## 2015-02-01 NOTE — Treatment Plan (Deleted)
Endocrinology note:   Spoke with RN via phone. [name] mental status is still poor. CT head yesterday was unrevealing. IV insulin has been paused at times due to lower blood sugars. She remains NPO given mental status and inability to swallow safely. Levothyroxine was changed from oral to IV yesterday. Currently she is requiring 8 mcg/hr of levophed. Urine output 60-80cc/hr via foley cath. She is tachypneic with RR 30's.   Filed Vitals:   02/01/15 0751 02/01/15 0800 02/01/15 0900 02/01/15 1000  BP: 104/61 113/58 105/47 106/49  Pulse: 88 87 91 91  Temp: 98.8 F (37.1 C)     TempSrc: Oral     Resp: 38 35 30 19  Height:      Weight:      SpO2: 98% 97% 97% 100%   BMP Latest Ref Rng 02/01/2015 01/31/2015 02-16-15  Glucose 65 - 99 mg/dL 409(W) 119(J) 478(G)  BUN 6 - 20 mg/dL Creatinine 0.44 - 1.00 mg/dL 9.56(O) 1.30(Q) 6.57(Q)  Sodium 135 - 145 mmol/L 127(L) 128(L) 126(L)  Potassium 3.5 - 5.1 mmol/L 3.7 3.4(L) 3.5  Chloride 101 - 111 mmol/L 109 108 102  CO2 22 - 32 mmol/L 13(L) 15(L) 17(L)  Calcium 8.9 - 10.3 mg/dL 4.6(N) 6.8(L) 7.3(L)    Recommendations:  Continue IV insulin per protocol using glucose stabilizer with goal BG 140-180 mg/dL. Would not transition to subQ insulin at this point given greater risk of hypoglycemia. Importantly, would consider options for nutrition, enteral vs parenteral. Insulin without sufficient glucose can result in ketogenesis and worsening metabolic acidosis. Her bicarb level continues to decline and she is now tachypneic. Would consider Nephrology consult. Continue IV levothyroxine 50 mcg daily.  Will follow along.  Please page if any questions/concerns [phone]  Doylene Canning, MD [facility]

## 2015-02-01 NOTE — Clinical Social Work Note (Signed)
Clinical Social Work Assessment  Patient Details  Name: [name] MRN: [id number] Date of Birth: Oct 16, 1945  Date of referral:  02/01/15               Reason for consult:   (from [facility] for [employer])                Permission sought to share information with:  Facility Medical sales representative, Family Supports Permission granted to share information::  Yes, Verbal Permission Granted  Name::      (Husband Yasmin Stable  [phone] and daughter [name] [phone])  Agency::   Environmental consultant)  Relationship::     Contact Information:     Housing/Transportation Living arrangements for the past 2 months:  Single Family Home, Skilled Nursing Facility Source of Information:  Patient, Adult Children Patient Interpreter Needed:  None Criminal Activity/Legal Involvement Pertinent to Current Situation/Hospitalization:  No - Comment as needed Significant Relationships:  Adult Children, Spouse Lives with:  Spouse Do you feel safe going back to the place where you live?  Yes Need for family participation in patient care:  Yes (Comment)  Care giving concerns:  Per daughters they are concerned if [facility] knows how to administer insulin to meet patient's diabetes needs.  Family will talk with [facility] when patient is ready for discharge.    Social Worker assessment / plan:  Patient is 69 year old female that reported to ED from [facility] after being discharged from [facility] on 01/23/15.  CSW in to assess patient.  Two daughters at bedside [name] and Buffy.  Difficult to understand patient due to slurred speech, daughters providing all information.    Patient lives with her husband prior to going to SNF for STR.  They have 3 daughters.  [name] is contact person second to patient's husband Ande Stable.   Per patient's daughters, patient has a strong support system. Family would like patient to return to [facility] at discharge.  Patient states she would also like to return.   CSW will  continue to follow patient for ongoing and disposition needs.  Completed FL2 and placed on chart in anticipation of patient returning to [facility] when medically stable.   Employment status:  Retired Database administrator PT Recommendations:  Not assessed at this time Information / Referral to community resources:  Skilled Nursing Facility  Patient/Family's Response to care:  Family and patient in agreement with returning to [facility] at discharge  Patient/Family's Understanding of and Emotional Response to Diagnosis, Current Treatment, and Prognosis:  Patient and family understand patient will continue with medical work up and will be discharged to [facility] when medically stable.  Emotional Assessment Appearance:  Appears stated age Attitude/Demeanor/Rapport:    Affect (typically observed):  Accepting, Pleasant, Quiet Orientation:  Oriented to Self, Oriented to Place, Oriented to  Time, Oriented to Situation Alcohol / Substance use:    Psych involvement (Current and /or in the community):   (none reported)  Discharge Needs  Concerns to be addressed:  No discharge needs identified Readmission within the last 30 days:  Yes Current discharge risk:  Physical Impairment, Chronically ill, Dependent with Mobility Barriers to Discharge:  Continued Medical Work up   [employer], LCSW 02/01/2015, 12:12 PM Sammuel Hines. Theresia Majors, MSW Clinical Social Work Department Emergency Room [phone] 12:16 PM

## 2015-02-01 NOTE — Progress Notes (Signed)
ANTIBIOTIC CONSULT NOTE - INITIAL  Pharmacy Consult for vancomycin/Zosyn Indication: PNA  No Known Allergies  Patient Measurements: Height:  (154.9 cm) Weight: 119 lb 11.4 oz (54.3 kg) IBW/kg (Calculated) : 47.8 Adjusted Body Weight: 50 kg  Vital Signs: Temp: 98.8 F (37.1 C) (09/03 0751) Temp Source: Oral (09/03 0751) BP: 106/49 mmHg (09/03 1000) Pulse Rate: 91 (09/03 1000) Intake/Output from previous day: 09/02 0701 - 09/03 0700 In: 3058.6 [I.V.:2508.6; IV Piggyback:550] Out: 1275 [Urine:1275] Intake/Output from this shift: Total I/O In: 300 [I.V.:300] Out: 140 [Urine:140]  Labs:  Recent Labs  02/10/2015 0214  02/17/2015 2108 01/31/15 0520 02/01/15 0508  WBC 13.1*  --   --  21.0* 16.1*  HGB 11.2*  --   --  9.5* 8.6*  PLT 356  --   --  260 227  CREATININE 1.99*  < > 1.53* 1.49* 1.47*  < > = values in this interval not displayed. Estimated Creatinine Clearance: 28 mL/min (by C-G formula based on Cr of 1.47). No results for input(s): VANCOTROUGH, VANCOPEAK, VANCORANDOM, GENTTROUGH, GENTPEAK, GENTRANDOM, TOBRATROUGH, TOBRAPEAK, TOBRARND, AMIKACINPEAK, AMIKACINTROU, AMIKACIN in the last 72 hours.   Microbiology: Recent Results (from the past 720 hour(s))  MRSA PCR Screening     Status: None   Collection Time: 01/20/15  1:15 PM  Result Value Ref Range Status   MRSA by PCR NEGATIVE NEGATIVE Final    Comment:        The GeneXpert MRSA Assay (FDA approved for NASAL specimens only), is one component of a comprehensive MRSA colonization surveillance program. It is not intended to diagnose MRSA infection nor to guide or monitor treatment for MRSA infections.   Urine culture     Status: None   Collection Time: 01/22/15 12:55 PM  Result Value Ref Range Status   Specimen Description URINE, RANDOM  Final   Special Requests NONE  Final   Culture NO GROWTH 1 DAY  Final   Report Status 01/23/2015 FINAL  Final  Culture, blood (routine x 2)     Status: None  (Preliminary result)   Collection Time: 02/05/2015  3:34 AM  Result Value Ref Range Status   Specimen Description BLOOD BLOOD RIGHT FOREARM  Final   Special Requests BOTTLES DRAWN AEROBIC AND ANAEROBIC  Final   Culture  Setup Time   Final    GRAM POSITIVE RODS ANAEROBIC RBV BRITTANY RUDD ON 02/13/2015 AT 1752 BY TB CONFIRMED BY TB/MS.    Culture   Final    BACILLUS SPECIES ANAEROBIC BOTTLE ONLY Results consistent with contamination.    Report Status PENDING  Incomplete  Culture, blood (routine x 2)     Status: None (Preliminary result)   Collection Time: 02/14/2015  3:34 AM  Result Value Ref Range Status   Specimen Description BLOOD RIGHT HAND  Final   Special Requests BOTTLES DRAWN AEROBIC AND ANAEROBIC  Final   Culture NO GROWTH 2 DAYS  Final   Report Status PENDING  Incomplete  MRSA PCR Screening     Status: None   Collection Time: 02/14/2015  5:15 AM  Result Value Ref Range Status   MRSA by PCR NEGATIVE NEGATIVE Final    Comment:        The GeneXpert MRSA Assay (FDA approved for NASAL specimens only), is one component of a comprehensive MRSA colonization surveillance program. It is not intended to diagnose MRSA infection nor to guide or monitor treatment for MRSA infections.     Medical History: Past Medical History  Diagnosis Date  . Diabetes mellitus type 1     (prior saw Dr. Lodema Hong endo, would like to establish locally)  . Generalized headaches     frequent  . Hypothyroidism   . Convulsions/seizures     unknown type, nml EEG  . History of chicken pox   . Anemia   . Vitamin B12 deficiency 03/2012    normal IF    Medications:  Infusions:  . dextrose 5 % and 0.9% NaCl 100 mL/hr at 02/01/15 1000  . insulin (NOVOLIN-R) infusion 0.4 mL/hr at 02/01/15 0912  . norepinephrine 8 mcg/min (02/01/15 0912)   Assessment:   Pharmacy consulted to dose vancomycin and Zosyn for 69 yo female on antibiotics for sepsis. Patient is currently ordered vancomycin 750mg  IV  Q24hr and Zosyn EI 3.375g IV Q8hr.    Goal of Therapy:  Vancomycin trough level 15-20 mcg/ml  Plan:  1. Vancomycin: will continue patient on vancomycin 750mg  IV Q24hr. Renal function stable, will check trough prior to AM dose 9/4  2. Zosyn: will continue patient on Zosyn EI 3.375g IV Q8hr.    Pharmacy will continue to monitor and adjust per consult.  Garlon Hatchet, PharmD Clinical Pharmacist   02/01/2015,10:37 AM

## 2015-02-01 NOTE — Progress Notes (Signed)
Pharmacy Consult for Electrolyte Management   No Known Allergies  Patient Measurements: Height:  (154.9 cm) Weight: 119 lb 11.4 oz (54.3 kg) IBW/kg (Calculated) : 47.8  Vital Signs: Temp: 98.8 F (37.1 C) (09/03 0751) Temp Source: Oral (09/03 0751) BP: 106/49 mmHg (09/03 1000) Pulse Rate: 91 (09/03 1000) Intake/Output from previous day: 09/02 0701 - 09/03 0700 In: 3058.6 [I.V.:2508.6; IV Piggyback:550] Out: 1275 [Urine:1275] Intake/Output from this shift: Total I/O In: 300 [I.V.:300] Out: 140 [Urine:140]  Labs:  Recent Labs  02/24/2015 0214 01/31/15 0520 02/01/15 0508  WBC 13.1* 21.0* 16.1*  HGB 11.2* 9.5* 8.6*  HCT 34.5* 29.1* 25.9*  PLT 356 260 227     Recent Labs  02/16/2015 0214  02/10/2015 2108 01/31/15 0520 02/01/15 0508  NA 126*  < > 126* 128* 127*  K 3.9  < > 3.5 3.4* 3.7  CL 94*  < > 102 108 109  CO2 14*  < > 17* 15* 13*  GLUCOSE 312*  < > 204* 166* 234*  BUN 17  < > CREATININE 1.99*  < > 1.53* 1.49* 1.47*  CALCIUM 8.9  < > 7.3* 6.8* 6.8*  MG 1.6*  --   --  1.6* 2.2  PHOS 2.1*  --   --  2.7 2.2*  PROT 6.8  --   --   --   --   ALBUMIN 3.1*  --   --   --   --   AST 31  --   --   --   --   ALT 13*  --   --   --   --   ALKPHOS 155*  --   --   --   --   BILITOT 1.2  --   --   --   --   < > = values in this interval not displayed. Estimated Creatinine Clearance: 28 mL/min (by C-G formula based on Cr of 1.47).    Recent Labs  02/01/15 0605 02/01/15 0758 02/01/15 0856  GLUCAP 206* 136* 104*    Medical History: Past Medical History  Diagnosis Date  . Diabetes mellitus type 1     (prior saw Dr. Lodema Hong endo, would like to establish locally)  . Generalized headaches     frequent  . Hypothyroidism   . Convulsions/seizures     unknown type, nml EEG  . History of chicken pox   . Anemia   . Vitamin B12 deficiency 03/2012    normal IF    Medications:  Scheduled:  . antiseptic oral rinse  7 mL Mouth Rinse BID  . heparin  5,000  Units Subcutaneous 3 times per day  . levothyroxine  50 mcg Intravenous Daily  . ofloxacin  1 drop Both Eyes QID  . piperacillin-tazobactam (ZOSYN)  IV  3.375 g Intravenous 3 times per day  . sodium chloride  3 mL Intravenous Q12H  . vancomycin  750 mg Intravenous Q24H   Infusions:  . dextrose 5 % and 0.9% NaCl 100 mL/hr at 02/01/15 1000  . insulin (NOVOLIN-R) infusion 0.4 mL/hr at 02/01/15 0912  . norepinephrine 8 mcg/min (02/01/15 0912)   PRN: acetaminophen **OR** acetaminophen, ondansetron **OR** ondansetron (ZOFRAN) IV, sodium chloride, sodium chloride  Assessment: Phamracy consulted to assist in managing electrolytes in this 69 y/o F with DKA.   Plan:  Electrolytes WNL except phos, marginally low at 2.2. Patient is NPO, will give KPhos IV x 1 and recheck electrolytes tomorrow AM.  Pharmacy to follow per consult.   Garlon Hatchet, PharmD Clinical Pharmacist   02/01/2015,10:31 AM

## 2015-02-02 LAB — GLUCOSE, CAPILLARY
GLUCOSE-CAPILLARY: 108 mg/dL — AB (ref 65–99)
GLUCOSE-CAPILLARY: 110 mg/dL — AB (ref 65–99)
GLUCOSE-CAPILLARY: 129 mg/dL — AB (ref 65–99)
GLUCOSE-CAPILLARY: 136 mg/dL — AB (ref 65–99)
GLUCOSE-CAPILLARY: 138 mg/dL — AB (ref 65–99)
GLUCOSE-CAPILLARY: 160 mg/dL — AB (ref 65–99)
GLUCOSE-CAPILLARY: 163 mg/dL — AB (ref 65–99)
GLUCOSE-CAPILLARY: 163 mg/dL — AB (ref 65–99)
GLUCOSE-CAPILLARY: 163 mg/dL — AB (ref 65–99)
GLUCOSE-CAPILLARY: 164 mg/dL — AB (ref 65–99)
GLUCOSE-CAPILLARY: 202 mg/dL — AB (ref 65–99)
GLUCOSE-CAPILLARY: 211 mg/dL — AB (ref 65–99)
GLUCOSE-CAPILLARY: 213 mg/dL — AB (ref 65–99)
GLUCOSE-CAPILLARY: 99 mg/dL (ref 65–99)
Glucose-Capillary: 200 mg/dL — ABNORMAL HIGH (ref 65–99)
Glucose-Capillary: 165 mg/dL — ABNORMAL HIGH (ref 65–99)
Glucose-Capillary: 164 mg/dL — ABNORMAL HIGH (ref 65–99)
Glucose-Capillary: 253 mg/dL — ABNORMAL HIGH (ref 65–99)
Glucose-Capillary: 232 mg/dL — ABNORMAL HIGH (ref 65–99)
Glucose-Capillary: 185 mg/dL — ABNORMAL HIGH (ref 65–99)
Glucose-Capillary: 148 mg/dL — ABNORMAL HIGH (ref 65–99)
Glucose-Capillary: 114 mg/dL — ABNORMAL HIGH (ref 65–99)
Glucose-Capillary: 103 mg/dL — ABNORMAL HIGH (ref 65–99)
Glucose-Capillary: 141 mg/dL — ABNORMAL HIGH (ref 65–99)

## 2015-02-02 LAB — C DIFFICILE QUICK SCREEN W PCR REFLEX
C DIFFICILE (CDIFF) TOXIN: POSITIVE — AB
C Diff antigen: POSITIVE — AB
C Diff interpretation: POSITIVE

## 2015-02-02 LAB — BASIC METABOLIC PANEL
Anion gap: 5 (ref 5–15)
BUN: 9 mg/dL (ref 6–20)
CALCIUM: 7.6 mg/dL — AB (ref 8.9–10.3)
CO2: 15 mmol/L — ABNORMAL LOW (ref 22–32)
Chloride: 113 mmol/L — ABNORMAL HIGH (ref 101–111)
Creatinine, Ser: 1.25 mg/dL — ABNORMAL HIGH (ref 0.44–1.00)
GFR calc Af Amer: 50 mL/min — ABNORMAL LOW (ref >60)
GFR, EST NON AFRICAN AMERICAN: 43 mL/min — AB (ref >60)
GLUCOSE: 206 mg/dL — AB (ref 65–99)
POTASSIUM: 3.7 mmol/L (ref 3.5–5.1)
SODIUM: 133 mmol/L — AB (ref 135–145)

## 2015-02-02 LAB — VANCOMYCIN, TROUGH: VANCOMYCIN TR: 16 ug/mL (ref 10–20)

## 2015-02-02 LAB — MAGNESIUM: MAGNESIUM: 1.9 mg/dL (ref 1.7–2.4)

## 2015-02-02 LAB — PHOSPHORUS: Phosphorus: 3.2 mg/dL (ref 2.5–4.6)

## 2015-02-02 MED ORDER — METRONIDAZOLE IN NACL 5-0.79 MG/ML-% IV SOLN
500.0000 mg | Freq: Three times a day (TID) | INTRAVENOUS | Status: DC
  Administered 2015-02-02 – 2015-02-06 (×12): 500 mg via INTRAVENOUS
  Filled 2015-02-02 (×15): qty 100

## 2015-02-02 NOTE — Progress Notes (Signed)
Pharmacy Consult for Electrolyte Management   No Known Allergies  Patient Measurements: Height:  (154.9 cm) Weight: 119 lb 11.4 oz (54.3 kg) IBW/kg (Calculated) : 47.8  Vital Signs: Temp: 97.1 F (36.2 C) (09/04 0700) Temp Source: Oral (09/04 0700) BP: 106/58 mmHg (09/04 0700) Pulse Rate: 88 (09/04 0700) Intake/Output from previous day: 09/03 0701 - 09/04 0700 In: 2684.8 [I.V.:2139.3; IV Piggyback:545.5] Out: 2027 [Urine:2027] Intake/Output from this shift:    Labs:  Recent Labs  01/31/15 0520 02/01/15 0508  WBC 21.0* 16.1*  HGB 9.5* 8.6*  HCT 29.1* 25.9*  PLT 260 227     Recent Labs  01/31/15 0520 02/01/15 0508 02/02/15 0315  NA 128* 127* 133*  K 3.4* 3.7 3.7  CL 108 109 113*  CO2 15* 13* 15*  GLUCOSE 166* 234* 206*  BUN CREATININE 1.49* 1.47* 1.25*  CALCIUM 6.8* 6.8* 7.6*  MG 1.6* 2.2 1.9  PHOS 2.7 2.2* 3.2   Estimated Creatinine Clearance: 33 mL/min (by C-G formula based on Cr of 1.25).    Recent Labs  02/02/15 0458 02/02/15 0602 02/02/15 0705  GLUCAP 165* 164* 160*    Medical History: Past Medical History  Diagnosis Date  . Diabetes mellitus type 1     (prior saw Dr. Lodema Hong endo, would like to establish locally)  . Generalized headaches     frequent  . Hypothyroidism   . Convulsions/seizures     unknown type, nml EEG  . History of chicken pox   . Anemia   . Vitamin B12 deficiency 03/2012    normal IF    Medications:  Scheduled:  . antiseptic oral rinse  7 mL Mouth Rinse BID  . heparin  5,000 Units Subcutaneous 3 times per day  . levothyroxine  50 mcg Intravenous Daily  . methylPREDNISolone (SOLU-MEDROL) injection  60 mg Intravenous Q24H  . ofloxacin  1 drop Both Eyes QID  . piperacillin-tazobactam (ZOSYN)  IV  3.375 g Intravenous 3 times per day  . sodium chloride  3 mL Intravenous Q12H  . vancomycin  750 mg Intravenous Q24H   Infusions:  . dextrose 5 % and 0.9% NaCl 50 mL/hr at 02/02/15 0500  . insulin  (NOVOLIN-R) infusion 2 Units/hr (02/02/15 0700)  . norepinephrine 3 mcg/min (02/02/15 0600)   PRN: acetaminophen **OR** acetaminophen, ipratropium-albuterol, ondansetron **OR** ondansetron (ZOFRAN) IV, sodium chloride, sodium chloride  Assessment: Phamracy consulted to assist in managing electrolytes in this 69 y/o F with DKA.   Plan:  Electrolytes WNL this morning, no supplementation ordered. Will recheck electrolytes tomorrow AM.  Pharmacy to follow per consult.   Garlon Hatchet, PharmD Clinical Pharmacist   02/02/2015,7:53 AM

## 2015-02-02 NOTE — Progress Notes (Signed)
ANTIBIOTIC CONSULT NOTE - Follow up  Pharmacy Consult for vancomycin/Zosyn Indication: PNA  No Known Allergies  Patient Measurements: Height:  (154.9 cm) Weight: 119 lb 11.4 oz (54.3 kg) IBW/kg (Calculated) : 47.8 Adjusted Body Weight: 50 kg  Vital Signs: Temp: 97.3 F (36.3 C) (09/04 0000) Temp Source: Oral (09/04 0000) BP: 87/48 mmHg (09/04 0300) Pulse Rate: 88 (09/04 0300) Intake/Output from previous day: 09/03 0701 - 09/04 0700 In: 2256.5 [I.V.:1911; IV Piggyback:345.5] Out: 1897 [Urine:1897] Intake/Output from this shift: Total I/O In: 557.1 [I.V.:507.1; IV Piggyback:50] Out: 940 [Urine:940]  Labs:  Recent Labs  01/31/15 0520 02/01/15 0508 02/02/15 0315  WBC 21.0* 16.1*  --   HGB 9.5* 8.6*  --   PLT 260 227  --   CREATININE 1.49* 1.47* 1.25*   Estimated Creatinine Clearance: 33 mL/min (by C-G formula based on Cr of 1.25).  Recent Labs  02/02/15 0315  [facility] 16     Microbiology: Recent Results (from the past 720 hour(s))  MRSA PCR Screening     Status: None   Collection Time: 01/20/15  1:15 PM  Result Value Ref Range Status   MRSA by PCR NEGATIVE NEGATIVE Final    Comment:        The GeneXpert MRSA Assay (FDA approved for NASAL specimens only), is one component of a comprehensive MRSA colonization surveillance program. It is not intended to diagnose MRSA infection nor to guide or monitor treatment for MRSA infections.   Urine culture     Status: None   Collection Time: 01/22/15 12:55 PM  Result Value Ref Range Status   Specimen Description URINE, RANDOM  Final   Special Requests NONE  Final   Culture NO GROWTH 1 DAY  Final   Report Status 01/23/2015 FINAL  Final  Culture, blood (routine x 2)     Status: None (Preliminary result)   Collection Time: 02/07/2015  3:34 AM  Result Value Ref Range Status   Specimen Description BLOOD BLOOD RIGHT FOREARM  Final   Special Requests BOTTLES DRAWN AEROBIC AND ANAEROBIC  Final   Culture   Setup Time   Final    GRAM POSITIVE RODS IN BOTH AEROBIC AND ANAEROBIC BOTTLES CRITICAL RESULT CALLED TO, READ BACK BY AND VERIFIED WITH: BRITTANY RUDD ON 02/17/2015 AT 1752 BY TB CONFIRMED BY TB/MS.    Culture   Final    BACILLUS SPECIES IN BOTH AEROBIC AND ANAEROBIC BOTTLES Results consistent with contamination.    Report Status PENDING  Incomplete  Culture, blood (routine x 2)     Status: None (Preliminary result)   Collection Time: 02/21/2015  3:34 AM  Result Value Ref Range Status   Specimen Description BLOOD RIGHT HAND  Final   Special Requests BOTTLES DRAWN AEROBIC AND ANAEROBIC  Final   Culture NO GROWTH 2 DAYS  Final   Report Status PENDING  Incomplete  MRSA PCR Screening     Status: None   Collection Time: 02/01/2015  5:15 AM  Result Value Ref Range Status   MRSA by PCR NEGATIVE NEGATIVE Final    Comment:        The GeneXpert MRSA Assay (FDA approved for NASAL specimens only), is one component of a comprehensive MRSA colonization surveillance program. It is not intended to diagnose MRSA infection nor to guide or monitor treatment for MRSA infections.     Medical History: Past Medical History  Diagnosis Date  . Diabetes mellitus type 1     (prior saw Dr. Lodema Hong  endo, would like to establish locally)  . Generalized headaches     frequent  . Hypothyroidism   . Convulsions/seizures     unknown type, nml EEG  . History of chicken pox   . Anemia   . Vitamin B12 deficiency 03/2012    normal IF    Medications:  Infusions:  . dextrose 5 % and 0.9% NaCl 50 mL/hr at 02/01/15 1300  . insulin (NOVOLIN-R) infusion 2.8 mL/hr at 02/02/15 0300  . norepinephrine 4 mcg/min (02/02/15 0200)   Assessment:   Pharmacy consulted to dose vancomycin and Zosyn for 69 yo female on antibiotics for sepsis. Patient is currently ordered vancomycin 750mg  IV Q24hr and Zosyn EI 3.375g IV Q8hr.    Goal of Therapy:  Vancomycin trough level 15-20 mcg/ml  Plan:  Vancomycin level  therapeutic - will continue current therapy.  Pharmacy will continue to monitor and adjust per consult.  Adeoluwa Silvers A. Dahlia Bailiff, PharmD Clinical Pharmacist   02/02/2015,4:04 AM

## 2015-02-02 NOTE — Progress Notes (Signed)
[facility] [place] [facility]                                                                                                                                                                                            Patient Demographics   [name], is a 69 y.o. female, DOB - 10/16/1945, MVH:[id number]  Admit date - 02/27/2015   Admitting Physician Oralia Manis, MD  Outpatient Primary MD for the patient is Lauro Regulus., MD   LOS - 3  Subjective: Patient was recently hospitalized with nausea vomiting and DKA. He was discharged to rehabilitation. Patient at the skilled nursing facility was noted to have hypotension. She was brought to the emergency room and was noted to be very lethargic and very weak. Patient was admitted with DKA because of her anion gap being 18. And lactic acid being 2.5  This morning patient is having the wheezing. Also lethargic.   Review of Systems:   Limited due to patient being lethargic   Vitals:   Filed Vitals:   02/02/15 0600 02/02/15 0700 02/02/15 0800 02/02/15 0900  BP: 118/50 106/58 111/51 96/44  Pulse:  88 87 89  Temp:  97.1 F (36.2 C)    TempSrc:  Oral    Resp: Height:      Weight:      SpO2: 98% 95% 99% 98%    Wt Readings from Last 3 Encounters:  02/23/2015 54.3 kg (119 lb 11.4 oz)  01/22/15 56.8 kg (125 lb 3.5 oz)  01/17/15 53.071 kg (117 lb)     Intake/Output Summary (Last 24 hours) at 02/02/15 1107 Last data filed at 02/02/15 0600  Gross per 24 hour  Intake 2284.43 ml  Output   1702 ml  Net 582.43 ml    Physical Exam:   GENERAL lethargic HEAD, EYES, EARS, NOSE AND THROAT: Atraumatic, normocephalic. Extraocular muscles are intact. Pupils equal and reactive to light. Sclerae anicteric. No conjunctival injection. No oro-pharyngeal erythema.  NECK: Supple. There is no jugular venous distention. No bruits, no lymphadenopathy, no thyromegaly.  HEART: Regular rate and rhythm,. No  murmurs, no rubs, no clicks.  LUNGS: Bilateral expiratory wheeze in all lung fields. ABDOMEN: Soft, flat, nontender, nondistended. Has good bowel sounds. No hepatosplenomegaly appreciated.  EXTREMITIES: No evidence of any cyanosis, clubbing, or peripheral edema.  +2 pedal and radial pulses bilaterally.  NEUROLOGIC: Patient is sleepy but able to move all extremities no focal deficit appreciated SKIN: Moist and warm with no rashes appreciated.  Psych: Not anxious, depressed LN: No inguinal LN enlargement    Antibiotics  Anti-infectives    Start     Dose/Rate Route Frequency Ordered Stop   01/31/15 1600  vancomycin (VANCOCIN) IVPB 750 mg/150 ml premix  Status:  Discontinued     750 mg 150 mL/hr over 60 Minutes Intravenous Every 36 hours 02-02-2015 0516 Feb 02, 2015 1228   01/31/15 0400  vancomycin (VANCOCIN) IVPB 750 mg/150 ml premix     750 mg 150 mL/hr over 60 Minutes Intravenous Every 24 hours February 02, 2015 1228     02-02-15 0600  piperacillin-tazobactam (ZOSYN) IVPB 3.375 g     3.375 g 12.5 mL/hr over 240 Minutes Intravenous 3 times per day 02-02-2015 0516     Feb 02, 2015 0345  vancomycin (VANCOCIN) IVPB 1000 mg/200 mL premix     1,000 mg 200 mL/hr over 60 Minutes Intravenous  Once 02/02/15 0331 02/02/15 0506   02-02-2015 0345  piperacillin-tazobactam (ZOSYN) IVPB 3.375 g  Status:  Discontinued     3.375 g 12.5 mL/hr over 240 Minutes Intravenous  Once 2015-02-02 0331 February 02, 2015 0745      Medications   Scheduled Meds: . antiseptic oral rinse  7 mL Mouth Rinse BID  . heparin  5,000 Units Subcutaneous 3 times per day  . levothyroxine  50 mcg Intravenous Daily  . methylPREDNISolone (SOLU-MEDROL) injection  60 mg Intravenous Q24H  . ofloxacin  1 drop Both Eyes QID  . piperacillin-tazobactam (ZOSYN)  IV  3.375 g Intravenous 3 times per day  . sodium chloride  3 mL Intravenous Q12H  . vancomycin  750 mg Intravenous Q24H   Continuous Infusions: . dextrose 5 % and 0.9% NaCl 50 mL/hr at 02/02/15 0500   . insulin (NOVOLIN-R) infusion Stopped (02/02/15 0907)  . norepinephrine 3 mcg/min (02/02/15 0600)   PRN Meds:.acetaminophen **OR** acetaminophen, ipratropium-albuterol, ondansetron **OR** ondansetron (ZOFRAN) IV, sodium chloride, sodium chloride   Data Review:   Micro Results Recent Results (from the past 240 hour(s))  Culture, blood (routine x 2)     Status: None (Preliminary result)   Collection Time: 2015-02-02  3:34 AM  Result Value Ref Range Status   Specimen Description BLOOD BLOOD RIGHT FOREARM  Final   Special Requests BOTTLES DRAWN AEROBIC AND ANAEROBIC  Final   Culture  Setup Time   Final    GRAM POSITIVE RODS IN BOTH AEROBIC AND ANAEROBIC BOTTLES CRITICAL RESULT CALLED TO, READ BACK BY AND VERIFIED WITH: BRITTANY RUDD ON 02/02/2015 AT 1752 BY TB CONFIRMED BY TB/MS.    Culture   Final    BACILLUS SPECIES IN BOTH AEROBIC AND ANAEROBIC BOTTLES Results consistent with contamination.    Report Status PENDING  Incomplete  Culture, blood (routine x 2)     Status: None (Preliminary result)   Collection Time: 02-02-2015  3:34 AM  Result Value Ref Range Status   Specimen Description BLOOD RIGHT HAND  Final   Special Requests BOTTLES DRAWN AEROBIC AND ANAEROBIC  Final   Culture NO GROWTH 3 DAYS  Final   Report Status PENDING  Incomplete  MRSA PCR Screening     Status: None   Collection Time: 02/02/15  5:15 AM  Result Value Ref Range Status   MRSA by PCR NEGATIVE NEGATIVE Final    Comment:        The GeneXpert MRSA Assay (FDA approved for NASAL specimens only), is one component of a comprehensive MRSA colonization surveillance program. It is not intended to diagnose MRSA infection nor to guide or monitor treatment for MRSA infections.     Radiology Reports Dg Chest 1  View  02/01/2015   CLINICAL DATA:  69 year old female admitted to the hospital on 02/08/2015 for hypotension. New onset of wheezing.  EXAM: CHEST  1 VIEW  COMPARISON:  Chest x-ray 01/31/2015.   FINDINGS: There is a right-sided internal jugular central venous catheter with tip terminating in the distal superior vena cava. Lung volumes are low. Patchy multifocal asymmetrically distributed interstitial and airspace disease, most evident throughout the mid to lower lungs bilaterally (left greater than right). Small bilateral pleural effusions (left greater than right). Pulmonary vasculature does not appear engorged. Heart size left is normal. The patient is rotated to the left on today's exam, resulting in distortion of the mediastinal contours and reduced diagnostic sensitivity and specificity for mediastinal pathology. Atherosclerosis in the thoracic aorta.  IMPRESSION: 1. Interval development of asymmetrically distributed patchy multifocal interstitial and airspace disease in the lungs bilaterally (left greater than right), concerning for multilobar pneumonia. 2. Small bilateral pleural effusions (left greater than right). 3. Atherosclerosis.   Electronically Signed   By: Trudie Reed M.D.   On: 02/01/2015 11:45   Dg Chest 2 View  01/19/2015   CLINICAL DATA:  Status post left fourth through sixth rib fractures 01/17/2015 after a fall. Vomiting and diarrhea today. Subsequent encounter.  EXAM: CHEST  2 VIEW  COMPARISON:  Plain films of the chest and left ribs 01/17/2015. PA and lateral chest 05/15/2013. CT chest 01/06/2010.  FINDINGS: Left fourth through seventh rib fractures are noted. No new fracture is identified. The lungs are clear. No pneumothorax or pleural effusion is identified. Heart size is normal. Aortic atherosclerosis is noted.  IMPRESSION: Left fourth through seventh rib fractures. Negative for pneumothorax or acute cardiopulmonary disease.   Electronically Signed   By: Drusilla Kanner M.D.   On: 01/19/2015 14:14   Dg Ribs Unilateral W/chest Left  01/17/2015   CLINICAL DATA:  Status post fall today complaining of left posterior rib pain.  EXAM: LEFT RIBS AND CHEST - 3+ VIEW   COMPARISON:  May 15, 2013  FINDINGS: There are displaced fractures of the left fourth, fifth, sixth, seventh ribs. There questioned fractures of the left second and third ribs. There is no pneumothorax. The lungs are clear. The mediastinal contour and cardiac silhouette are normal.  IMPRESSION: Fractures of left ribs as described.   Electronically Signed   By: Sherian Rein M.D.   On: 01/17/2015 16:53   Dg Thoracic Spine 2 View  01/17/2015   CLINICAL DATA:  Patient status post fall while walking up the stairs. Left posterior rib and thoracic spine pain worse when breathing.  EXAM: THORACIC SPINE 2 VIEWS  COMPARISON:  Chest radiograph 05/15/2013  FINDINGS: Superior thoracic vertebral bodies are not well visualized. The mid and lower thoracic vertebral bodies are visualized and appear in normal height with normal intervertebral disc spaces. There is rightward curvature of the proximal thoracic spine demonstrated on the AP view. Multiple left-sided rib fractures, incompletely evaluated on current exam.  IMPRESSION: Rightward curvature of the proximal thoracic spine. Recommend correlation for point tenderness at this location.  Otherwise relative preservation the vertebral body heights.  Incompletely visualized probable left rib fractures. See dedicated chest and rib report.   Electronically Signed   By: Annia Belt M.D.   On: 01/17/2015 16:52   Ct Head Wo Contrast  01/31/2015   CLINICAL DATA:  Lethargy and decreased responsiveness today.  EXAM: CT HEAD WITHOUT CONTRAST  TECHNIQUE: Contiguous axial images were obtained from the base of the skull through the vertex  without intravenous contrast.  COMPARISON:  CT head without contrast 01/19/2015. MRI brain 08/15/2009.  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 No acute infarct, hemorrhage, or mass lesion is otherwise present. The ventricles are of normal size. No significant extra-axial fluid collection is present.  The basal ganglia are intact. The insular ribbon is intact.  The paranasal sinuses and mastoid air cells are clear.  IMPRESSION: 1. No acute intracranial abnormality or significant interval change. 2. 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   Electronically Signed   By: Marin Roberts M.D.   On: 01/31/2015 16:26   Ct Head Wo Contrast  01/19/2015   CLINICAL DATA:  Intractable vomiting over the last few days.  EXAM: CT HEAD WITHOUT CONTRAST  TECHNIQUE: Contiguous axial images were obtained from the base of the skull through the vertex without intravenous contrast.  COMPARISON:  Head CT 01/17/2015  FINDINGS: Brainstem is normal. Chronically known 1 cm lesion of the left cerebellum is again visible, consistent with a chronic cavernous angioma. Some adjacent brain low density is present as has been seen previously. No sign that there is any active process in this region. The cerebral hemispheres are normal. No evidence of old or acute infarction, mass lesion, hemorrhage, hydrocephalus or extra-axial collection. Chronic benign left calvarial lucency likely related to a hemangioma. No acute calvarial finding. Sinuses, middle ears and mastoids are clear.  IMPRESSION: No acute intracranial finding. 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 Based on the unchanged appearance, this would be on likely to relate to the clinical presentation.   Electronically Signed   By: Paulina Fusi M.D.   On: 01/19/2015 13:31   Ct Head Wo Contrast  01/17/2015   CLINICAL DATA:  Larey Seat downstairs today.  Hit head.  EXAM: CT HEAD WITHOUT CONTRAST  CT CERVICAL SPINE WITHOUT CONTRAST  TECHNIQUE: Multidetector CT imaging of the head and cervical spine was performed following the standard protocol without intravenous contrast. Multiplanar CT image reconstructions of the cervical spine were also generated.  COMPARISON:  Head CT 08/14/2009  FINDINGS: CT HEAD FINDINGS   The ventricles are normal in size and configuration. No extra-axial fluid collections are identified. The gray-white differentiation is normal. No CT findings for acute intracranial process such as hemorrhage or infarction. No mass lesions. The brainstem and cerebellum are grossly normal.  The bony structures are intact. The paranasal sinuses and mastoid air cells are clear. The globes are intact.  CT CERVICAL SPINE FINDINGS  Degenerative cervical spondylosis with mild multilevel disc disease and facet disease. The cervical vertebral bodies are normally aligned. No acute fracture. The facets are normally aligned. Moderate C1-2 degenerative changes but no dens fracture. The spinal canal is quite generous. No spinal or significant foraminal stenosis. The lung apices are clear.  IMPRESSION: 1. No acute intracranial findings or skull fracture. 2. Mild degenerative cervical spondylosis but no acute cervical spine fracture.   Electronically Signed   By: Rudie Meyer M.D.   On: 01/17/2015 15:21   Ct Cervical Spine Wo Contrast  01/17/2015   CLINICAL DATA:  Larey Seat downstairs today.  Hit head.  EXAM: CT HEAD WITHOUT CONTRAST  CT CERVICAL SPINE WITHOUT CONTRAST  TECHNIQUE: Multidetector CT imaging of the head and cervical spine was performed following the standard protocol without intravenous contrast. Multiplanar CT image reconstructions of the cervical spine were also generated.  COMPARISON:  Head CT 08/14/2009  FINDINGS: CT HEAD FINDINGS  The ventricles are normal in size and configuration. No extra-axial fluid collections are identified. The gray-white differentiation is normal. No CT findings for acute intracranial process such as hemorrhage or infarction. No mass lesions. The brainstem and cerebellum are grossly normal.  The bony structures are intact. The paranasal sinuses and mastoid air cells are clear. The globes are intact.  CT CERVICAL SPINE FINDINGS  Degenerative cervical spondylosis with mild multilevel disc  disease and facet disease. The cervical vertebral bodies are normally aligned. No acute fracture. The facets are normally aligned. Moderate C1-2 degenerative changes but no dens fracture. The spinal canal is quite generous. No spinal or significant foraminal stenosis. The lung apices are clear.  IMPRESSION: 1. No acute intracranial findings or skull fracture. 2. Mild degenerative cervical spondylosis but no acute cervical spine fracture.   Electronically Signed   By: Rudie Meyer M.D.   On: 01/17/2015 15:21   Dg Chest Port 1 View  01/31/2015   Ricarda Frame, MD     01/31/2015  1:33 AM Central Venous Catheter Insertion Procedure Note SHATERRIA SAGER [id number] 1945/11/20  Procedure: Insertion of Central Venous Catheter Indications: Drug and/or fluid administration  Procedure Details Consent: Risks of procedure as well as the alternatives and risks  of each were explained to the (patient/caregiver).  Consent for  procedure obtained. Time Out: Verified patient identification, verified procedure,  site/side was marked, verified correct patient position, special  equipment/implants available, medications/allergies/relevent  history reviewed, required imaging and test results available.   Performed  Maximum sterile technique was used including antiseptics, cap,  gloves, gown, hand hygiene, mask and sheet. Skin prep: Chlorhexidine; local anesthetic administered A antimicrobial bonded/coated triple lumen catheter was placed in  the right internal jugular vein using the Seldinger technique.  Evaluation Blood flow good Complications: No apparent complications Patient did tolerate procedure well. Chest X-ray ordered to verify placement.  CXR: pending.  Ricarda Frame 01/31/2015, 1:32 AM    Dg Chest Port 1 View  02/09/2015   CLINICAL DATA:  Acute onset of generalized weakness. Initial encounter.  EXAM: PORTABLE CHEST - 1 VIEW  COMPARISON:  Chest radiograph performed 01/19/2015  FINDINGS: The lungs are well-aerated. Minimal  bilateral atelectasis is noted. There is no evidence of pleural effusion or pneumothorax.  The cardiomediastinal silhouette is within normal limits. No acute osseous abnormalities are seen.  IMPRESSION: Minimal bilateral atelectasis noted.  Lungs otherwise clear.   Electronically Signed   By: Roanna Raider M.D.   On: 02/13/2015 02:33   Ct Renal Stone Study  01/19/2015   CLINICAL DATA:  Severe vomiting and hypotension.  EXAM: CT ABDOMEN AND PELVIS WITHOUT CONTRAST  TECHNIQUE: Multidetector CT imaging of the abdomen and pelvis was performed following the standard protocol without IV contrast.  COMPARISON:  Abdomen CT 07/05/2005  FINDINGS: Lower chest: No acute findings.  Hepatobiliary: No mass visualized on this unenhanced exam. Gallbladder is unremarkable.  Pancreas: No mass or inflammatory process visualized on this unenhanced exam. Diffuse fatty replacement of pancreas noted.  Spleen:  Within normal limits in size.  Adrenal Glands:  No masses identified.  Kidneys/Urinary tract: No evidence of urolithiasis or hydronephrosis.  Stomach/Bowel/Peritoneum: Sigmoid diverticulosis is noted. No evidence of diverticulitis or other inflammatory process. Normal appendix visualized.  Vascular/Lymphatic: No pathologically enlarged lymph nodes identified. No abdominal aortic aneurysm or other significant retroperitoneal abnormality demonstrated.  Reproductive:  No mass or other significant abnormality noted.  Other:  None.  Musculoskeletal:  No suspicious bone lesions identified.  IMPRESSION: Sigmoid diverticulosis.  No radiographic evidence of diverticulitis or other acute findings.   Electronically Signed   By: Myles Rosenthal M.D.   On: 01/19/2015 14:14     CBC  Recent Labs Lab February 03, 2015 0214 01/31/15 0520 02/01/15 0508  WBC 13.1* 21.0* 16.1*  HGB 11.2* 9.5* 8.6*  HCT 34.5* 29.1* 25.9*  PLT 356 260 227  MCV 86.1 84.0 84.6  MCH 27.9 27.3 28.2  MCHC 32.4 32.5 33.3  RDW 14.3 14.2 14.6*  LYMPHSABS 1.5  --   --    MONOABS 0.8  --   --   EOSABS 0.8*  --   --   BASOSABS 0.1  --   --     Chemistries   Recent Labs Lab 2015-02-03 0214  Feb 03, 2015 1847 03-Feb-2015 2108 01/31/15 0520 02/01/15 0508 02/02/15 0315  NA 126*  < > 127* 126* 128* 127* 133*  K 3.9  < > 3.8 3.5 3.4* 3.7 3.7  CL 94*  < > 102 102 108 109 113*  CO2 14*  < > 16* 17* 15* 13* 15*  GLUCOSE 312*  < > 199* 204* 166* 234* 206*  BUN 17  < > 11 11 9 8 9   CREATININE 1.99*  < > 1.37* 1.53* 1.49* 1.47* 1.25*  CALCIUM 8.9  < > 7.7* 7.3* 6.8* 6.8* 7.6*  MG 1.6*  --   --   --  1.6* 2.2 1.9  AST 31  --   --   --   --   --   --   ALT 13*  --   --   --   --   --   --   ALKPHOS 155*  --   --   --   --   --   --   BILITOT 1.2  --   --   --   --   --   --   < > = values in this interval not displayed. ------------------------------------------------------------------------------------------------------------------ estimated creatinine clearance is 33 mL/min (by C-G formula based on Cr of 1.25). ------------------------------------------------------------------------------------------------------------------ No results for input(s): HGBA1C in the last 72 hours. ------------------------------------------------------------------------------------------------------------------ No results for input(s): CHOL, HDL, LDLCALC, TRIG, CHOLHDL, LDLDIRECT in the last 72 hours. ------------------------------------------------------------------------------------------------------------------ No results for input(s): TSH, T4TOTAL, T3FREE, THYROIDAB in the last 72 hours.  Invalid input(s): FREET3 ------------------------------------------------------------------------------------------------------------------ No results for input(s): VITAMINB12, FOLATE, FERRITIN, TIBC, IRON, RETICCTPCT in the last 72 hours.  Coagulation profile No results for input(s): INR, PROTIME in the last 168 hours.  No results for input(s): DDIMER in the last 72 hours.  Cardiac  Enzymes  Recent Labs Lab 2015-02-03 0214  TROPONINI 0.05*   ------------------------------------------------------------------------------------------------------------------ Invalid input(s): POCBNP    Assessment & Plan   #1 DKA (diabetic ketoacidoses) - as per endocrinology recommendation continue insulin drip. Continue insulin drip , #2 Dehydration - due to poor by mouth intake IV fluids,improved, lethargic improved slightly today. Speech Therapy evaluation. Tomorrow. #3 septic shock: Unknown source at this time continue vancomycin, Zosyn, pressor.  #4 . Acute renal failure with hyponatremia: continue .  #5recent ribs fracture- when necessary pain control, incentive spirometry try to avoid narcotics which may be playing a role in her lethargy and weakness #6 Hypothyroidism - continue  Iv levothyroxine 7.acute respiratory failure due to bilateral pneumonia;continue vanco, zosyn,o2 7. CODE STATUS DO NOT RESUSCITATE. Discussed this with patient's daughter did express that patient wants to be DO NOT RESUSCITATE. We will place the order in the chart.     Code Status Orders  Start     Ordered   03/01/15 0505  Full code   Continuous     01-Mar-2015 0504           Consults endocrinology   DVT Prophylaxis  heparin  Lab Results  Component Value Date   PLT 227 02/01/2015     Time Spent in minutes  45 minutes of critical care time patient still on insulin drip blood glucose and needs to be monitored closely Fonda Rochon M.D on 02/02/2015 at 11:07 AM  Between 7am to 6pm - Pager - [phone]  After 6pm go to [email or web address] - password [email or web address] [facility]  [facility]   Office  [phone]

## 2015-02-03 DIAGNOSIS — E43 Unspecified severe protein-calorie malnutrition: Secondary | ICD-10-CM | POA: Insufficient documentation

## 2015-02-03 LAB — GLUCOSE, CAPILLARY
GLUCOSE-CAPILLARY: 218 mg/dL — AB (ref 65–99)
GLUCOSE-CAPILLARY: 186 mg/dL — AB (ref 65–99)
GLUCOSE-CAPILLARY: 98 mg/dL (ref 65–99)
GLUCOSE-CAPILLARY: 117 mg/dL — AB (ref 65–99)
GLUCOSE-CAPILLARY: 121 mg/dL — AB (ref 65–99)
GLUCOSE-CAPILLARY: 215 mg/dL — AB (ref 65–99)
GLUCOSE-CAPILLARY: 175 mg/dL — AB (ref 65–99)
GLUCOSE-CAPILLARY: 139 mg/dL — AB (ref 65–99)
GLUCOSE-CAPILLARY: 137 mg/dL — AB (ref 65–99)
GLUCOSE-CAPILLARY: 193 mg/dL — AB (ref 65–99)
Glucose-Capillary: 124 mg/dL — ABNORMAL HIGH (ref 65–99)
Glucose-Capillary: 135 mg/dL — ABNORMAL HIGH (ref 65–99)
Glucose-Capillary: 140 mg/dL — ABNORMAL HIGH (ref 65–99)
Glucose-Capillary: 153 mg/dL — ABNORMAL HIGH (ref 65–99)
Glucose-Capillary: 153 mg/dL — ABNORMAL HIGH (ref 65–99)
Glucose-Capillary: 158 mg/dL — ABNORMAL HIGH (ref 65–99)
Glucose-Capillary: 183 mg/dL — ABNORMAL HIGH (ref 65–99)
Glucose-Capillary: 189 mg/dL — ABNORMAL HIGH (ref 65–99)
Glucose-Capillary: 209 mg/dL — ABNORMAL HIGH (ref 65–99)
Glucose-Capillary: 212 mg/dL — ABNORMAL HIGH (ref 65–99)
Glucose-Capillary: 233 mg/dL — ABNORMAL HIGH (ref 65–99)
Glucose-Capillary: 235 mg/dL — ABNORMAL HIGH (ref 65–99)

## 2015-02-03 LAB — CBC
HCT: 24.7 % — ABNORMAL LOW (ref 35.0–47.0)
Hemoglobin: 8.3 g/dL — ABNORMAL LOW (ref 12.0–16.0)
MCH: 27.6 pg (ref 26.0–34.0)
MCHC: 33.5 g/dL (ref 32.0–36.0)
MCV: 82.6 fL (ref 80.0–100.0)
PLATELETS: 243 10*3/uL (ref 150–440)
RBC: 2.99 MIL/uL — AB (ref 3.80–5.20)
RDW: 14.2 % (ref 11.5–14.5)
WBC: 19.7 10*3/uL — AB (ref 3.6–11.0)

## 2015-02-03 LAB — CULTURE, BLOOD (ROUTINE X 2)

## 2015-02-03 LAB — BASIC METABOLIC PANEL
ANION GAP: 5 (ref 5–15)
BUN: 11 mg/dL (ref 6–20)
CALCIUM: 7.5 mg/dL — AB (ref 8.9–10.3)
CO2: 16 mmol/L — ABNORMAL LOW (ref 22–32)
Chloride: 115 mmol/L — ABNORMAL HIGH (ref 101–111)
Creatinine, Ser: 1.19 mg/dL — ABNORMAL HIGH (ref 0.44–1.00)
GFR, EST AFRICAN AMERICAN: 54 mL/min — AB (ref >60)
GFR, EST NON AFRICAN AMERICAN: 46 mL/min — AB (ref >60)
Glucose, Bld: 104 mg/dL — ABNORMAL HIGH (ref 65–99)
POTASSIUM: 3.1 mmol/L — AB (ref 3.5–5.1)
SODIUM: 136 mmol/L (ref 135–145)

## 2015-02-03 LAB — MAGNESIUM: MAGNESIUM: 1.8 mg/dL (ref 1.7–2.4)

## 2015-02-03 LAB — PHOSPHORUS: Phosphorus: 2.6 mg/dL (ref 2.5–4.6)

## 2015-02-03 MED ORDER — POTASSIUM CHLORIDE 10 MEQ/100ML IV SOLN
10.0000 meq | INTRAVENOUS | Status: AC
  Administered 2015-02-03 (×4): 10 meq via INTRAVENOUS
  Filled 2015-02-03 (×4): qty 100

## 2015-02-03 NOTE — Progress Notes (Signed)
[facility] [place] at [facility]                                                                                                                                                                                            Patient Demographics   [name], is a 69 y.o. female, DOB - Sep 30, 1945, AVW:[id number]  Admit date - 02/04/2015   Admitting Physician Oralia Manis, MD  Outpatient Primary MD for the patient is Lauro Regulus., MD   LOS - 4  Subjective: Patient was recently hospitalized with nausea vomiting and DKA. He was discharged to rehabilitation. Patient at the skilled nursing facility was noted to have hypotension.on insulin drip since admission. Also was lethargic for the past few days. But today she is more alert oriented. Stool C. difficile is positive and started on Flagyl yesterday. Following commands today. Her BP is better today. Weaning down the Levophed drip.  Review of Systems:   Limited due to patient being lethargic   Vitals:   Filed Vitals:   02/03/15 0800 02/03/15 0900 02/03/15 1000 02/03/15 1100  BP: 90/56 110/58 97/65 92/50   Pulse: 81 85 86 84  Temp:      TempSrc:      Resp: 20 22 26 23   Height:      Weight:      SpO2: 99% 97% 100% 98%    Wt Readings from Last 3 Encounters:  02/21/2015 54.3 kg (119 lb 11.4 oz)  01/22/15 56.8 kg (125 lb 3.5 oz)  01/17/15 53.071 kg (117 lb)     Intake/Output Summary (Last 24 hours) at 02/03/15 1145 Last data filed at 02/03/15 1000  Gross per 24 hour  Intake 458.47 ml  Output    800 ml  Net -341.53 ml    Physical Exam:   GENERAL lethargic HEAD, EYES, EARS, NOSE AND THROAT: Atraumatic, normocephalic. Extraocular muscles are intact. Pupils equal and reactive to light. Sclerae anicteric. No conjunctival injection. No oro-pharyngeal erythema.  NECK: Supple. There is no jugular venous distention. No bruits, no lymphadenopathy, no thyromegaly.  HEART: Regular rate and rhythm,. No  murmurs, no rubs, no clicks.  LUNGS: Bilateral expiratory wheeze in all lung fields. ABDOMEN: Soft, flat, nontender, nondistended. Has good bowel sounds. No hepatosplenomegaly appreciated.  EXTREMITIES: No evidence of any cyanosis, clubbing, or peripheral edema.  +2 pedal and radial pulses bilaterally.  NEUROLOGIC: Patient is sleepy but able to move all extremities no focal deficit appreciated SKIN: Moist and warm with no rashes appreciated.  Psych: Not anxious, depressed LN: No inguinal LN enlargement    Antibiotics   Anti-infectives    Start  Dose/Rate Route Frequency Ordered Stop   02/02/15 1815  metroNIDAZOLE (FLAGYL) IVPB 500 mg     500 mg 100 mL/hr over 60 Minutes Intravenous Every 8 hours 02/02/15 1804     01/31/15 1600  vancomycin (VANCOCIN) IVPB 750 mg/150 ml premix  Status:  Discontinued     750 mg 150 mL/hr over 60 Minutes Intravenous Every 36 hours 02/03/2015 0516 02/28/2015 1228   01/31/15 0400  vancomycin (VANCOCIN) IVPB 750 mg/150 ml premix     750 mg 150 mL/hr over 60 Minutes Intravenous Every 24 hours 02/14/2015 1228     02/02/2015 0600  piperacillin-tazobactam (ZOSYN) IVPB 3.375 g     3.375 g 12.5 mL/hr over 240 Minutes Intravenous 3 times per day 02/14/2015 0516     02/07/2015 0345  vancomycin (VANCOCIN) IVPB 1000 mg/200 mL premix     1,000 mg 200 mL/hr over 60 Minutes Intravenous  Once 02/08/2015 0331 02/12/2015 0506   02/27/2015 0345  piperacillin-tazobactam (ZOSYN) IVPB 3.375 g  Status:  Discontinued     3.375 g 12.5 mL/hr over 240 Minutes Intravenous  Once 02/17/2015 0331 02/23/2015 0745      Medications   Scheduled Meds: . antiseptic oral rinse  7 mL Mouth Rinse BID  . heparin  5,000 Units Subcutaneous 3 times per day  . levothyroxine  50 mcg Intravenous Daily  . methylPREDNISolone (SOLU-MEDROL) injection  60 mg Intravenous Q24H  . metronidazole  500 mg Intravenous Q8H  . ofloxacin  1 drop Both Eyes QID  . piperacillin-tazobactam (ZOSYN)  IV  3.375 g Intravenous 3  times per day  . potassium chloride  10 mEq Intravenous Q1 Hr x 4  . sodium chloride  3 mL Intravenous Q12H  . vancomycin  750 mg Intravenous Q24H   Continuous Infusions: . dextrose 5 % and 0.9% NaCl 50 mL/hr at 02/02/15 2309  . insulin (NOVOLIN-R) infusion 1.1 Units/hr (02/03/15 0408)  . norepinephrine 1 mcg/min (02/03/15 1132)   PRN Meds:.acetaminophen **OR** acetaminophen, ipratropium-albuterol, ondansetron **OR** ondansetron (ZOFRAN) IV, sodium chloride, sodium chloride   Data Review:   Micro Results Recent Results (from the past 240 hour(s))  Culture, blood (routine x 2)     Status: None   Collection Time: 02/27/2015  3:34 AM  Result Value Ref Range Status   Specimen Description BLOOD BLOOD RIGHT FOREARM  Final   Special Requests BOTTLES DRAWN AEROBIC AND ANAEROBIC  Final   Culture  Setup Time   Final    GRAM POSITIVE RODS IN BOTH AEROBIC AND ANAEROBIC BOTTLES CRITICAL RESULT CALLED TO, READ BACK BY AND VERIFIED WITH: BRITTANY RUDD ON 02/16/2015 AT 1752 BY TB CONFIRMED BY TB/MS.    Culture   Final    BACILLUS SPECIES IN BOTH AEROBIC AND ANAEROBIC BOTTLES Results consistent with contamination.    Report Status 02/03/2015 FINAL  Final  Culture, blood (routine x 2)     Status: None (Preliminary result)   Collection Time: 02/26/2015  3:34 AM  Result Value Ref Range Status   Specimen Description BLOOD RIGHT HAND  Final   Special Requests BOTTLES DRAWN AEROBIC AND ANAEROBIC  Final   Culture NO GROWTH 4 DAYS  Final   Report Status PENDING  Incomplete  MRSA PCR Screening     Status: None   Collection Time: 02/26/2015  5:15 AM  Result Value Ref Range Status   MRSA by PCR NEGATIVE NEGATIVE Final    Comment:        The GeneXpert MRSA Assay (FDA approved  for NASAL specimens only), is one component of a comprehensive MRSA colonization surveillance program. It is not intended to diagnose MRSA infection nor to guide or monitor treatment for MRSA infections.   C difficile  quick scan w PCR reflex     Status: Abnormal   Collection Time: 02/02/15  4:12 PM  Result Value Ref Range Status   C Diff antigen POSITIVE (A) NEGATIVE Final   C Diff toxin POSITIVE (A) NEGATIVE Final   C Diff interpretation   Final    Positive for toxigenic C. difficile, active toxin production present.    Comment: CRITICAL RESULT CALLED TO, READ BACK BY AND VERIFIED WITH: CHRISTINA MILES ON 02/02/15 AT 1715 BY [facility]     Radiology Reports Dg Chest 1 View  02/01/2015   CLINICAL DATA:  69 year old female admitted to the hospital on 02/14/2015 for hypotension. New onset of wheezing.  EXAM: CHEST  1 VIEW  COMPARISON:  Chest x-ray 01/31/2015.  FINDINGS: There is a right-sided internal jugular central venous catheter with tip terminating in the distal superior vena cava. Lung volumes are low. Patchy multifocal asymmetrically distributed interstitial and airspace disease, most evident throughout the mid to lower lungs bilaterally (left greater than right). Small bilateral pleural effusions (left greater than right). Pulmonary vasculature does not appear engorged. Heart size left is normal. The patient is rotated to the left on today's exam, resulting in distortion of the mediastinal contours and reduced diagnostic sensitivity and specificity for mediastinal pathology. Atherosclerosis in the thoracic aorta.  IMPRESSION: 1. Interval development of asymmetrically distributed patchy multifocal interstitial and airspace disease in the lungs bilaterally (left greater than right), concerning for multilobar pneumonia. 2. Small bilateral pleural effusions (left greater than right). 3. Atherosclerosis.   Electronically Signed   By: Trudie Reed M.D.   On: 02/01/2015 11:45   Dg Chest 2 View  01/19/2015   CLINICAL DATA:  Status post left fourth through sixth rib fractures 01/17/2015 after a fall. Vomiting and diarrhea today. Subsequent encounter.  EXAM: CHEST  2 VIEW  COMPARISON:  Plain films of the chest and left  ribs 01/17/2015. PA and lateral chest 05/15/2013. CT chest 01/06/2010.  FINDINGS: Left fourth through seventh rib fractures are noted. No new fracture is identified. The lungs are clear. No pneumothorax or pleural effusion is identified. Heart size is normal. Aortic atherosclerosis is noted.  IMPRESSION: Left fourth through seventh rib fractures. Negative for pneumothorax or acute cardiopulmonary disease.   Electronically Signed   By: Drusilla Kanner M.D.   On: 01/19/2015 14:14   Dg Ribs Unilateral W/chest Left  01/17/2015   CLINICAL DATA:  Status post fall today complaining of left posterior rib pain.  EXAM: LEFT RIBS AND CHEST - 3+ VIEW  COMPARISON:  May 15, 2013  FINDINGS: There are displaced fractures of the left fourth, fifth, sixth, seventh ribs. There questioned fractures of the left second and third ribs. There is no pneumothorax. The lungs are clear. The mediastinal contour and cardiac silhouette are normal.  IMPRESSION: Fractures of left ribs as described.   Electronically Signed   By: Sherian Rein M.D.   On: 01/17/2015 16:53   Dg Thoracic Spine 2 View  01/17/2015   CLINICAL DATA:  Patient status post fall while walking up the stairs. Left posterior rib and thoracic spine pain worse when breathing.  EXAM: THORACIC SPINE 2 VIEWS  COMPARISON:  Chest radiograph 05/15/2013  FINDINGS: Superior thoracic vertebral bodies are not well visualized. The mid and lower thoracic vertebral bodies  are visualized and appear in normal height with normal intervertebral disc spaces. There is rightward curvature of the proximal thoracic spine demonstrated on the AP view. Multiple left-sided rib fractures, incompletely evaluated on current exam.  IMPRESSION: Rightward curvature of the proximal thoracic spine. Recommend correlation for point tenderness at this location.  Otherwise relative preservation the vertebral body heights.  Incompletely visualized probable left rib fractures. See dedicated chest and rib  report.   Electronically Signed   By: Annia Belt M.D.   On: 01/17/2015 16:52   Ct Head Wo Contrast  01/31/2015   CLINICAL DATA:  Lethargy and decreased responsiveness today.  EXAM: CT HEAD WITHOUT CONTRAST  TECHNIQUE: Contiguous axial images were obtained from the base of the skull through the vertex without intravenous contrast.  COMPARISON:  CT head without contrast 01/19/2015. MRI brain 08/15/2009.  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 No acute infarct, hemorrhage, or mass lesion is otherwise present. The ventricles are of normal size. No significant extra-axial fluid collection is present. The basal ganglia are intact. The insular ribbon is intact.  The paranasal sinuses and mastoid air cells are clear.  IMPRESSION: 1. No acute intracranial abnormality or significant interval change. 2. 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   Electronically Signed   By: Marin Roberts M.D.   On: 01/31/2015 16:26   Ct Head Wo Contrast  01/19/2015   CLINICAL DATA:  Intractable vomiting over the last few days.  EXAM: CT HEAD WITHOUT CONTRAST  TECHNIQUE: Contiguous axial images were obtained from the base of the skull through the vertex without intravenous contrast.  COMPARISON:  Head CT 01/17/2015  FINDINGS: Brainstem is normal. Chronically known 1 cm lesion of the left cerebellum is again visible, consistent with a chronic cavernous angioma. Some adjacent brain low density is present as has been seen previously. No sign that there is any active process in this region. The cerebral hemispheres are normal. No evidence of old or acute infarction, mass lesion, hemorrhage, hydrocephalus or extra-axial collection. Chronic benign left calvarial lucency likely related to a hemangioma. No acute calvarial finding. Sinuses, middle ears and mastoids are clear.  IMPRESSION: No acute intracranial finding. Chronically known left cerebellar  lesion felt to represent a cavernous angioma. Based on the unchanged appearance, this would be on likely to relate to the clinical presentation.   Electronically Signed   By: Paulina Fusi M.D.   On: 01/19/2015 13:31   Ct Head Wo Contrast  01/17/2015   CLINICAL DATA:  Larey Seat downstairs today.  Hit head.  EXAM: CT HEAD WITHOUT CONTRAST  CT CERVICAL SPINE WITHOUT CONTRAST  TECHNIQUE: Multidetector CT imaging of the head and cervical spine was performed following the standard protocol without intravenous contrast. Multiplanar CT image reconstructions of the cervical spine were also generated.  COMPARISON:  Head CT 08/14/2009  FINDINGS: CT HEAD FINDINGS  The ventricles are normal in size and configuration. No extra-axial fluid collections are identified. The gray-white differentiation is normal. No CT findings for acute intracranial process such as hemorrhage or infarction. No mass lesions. The brainstem and cerebellum are grossly normal.  The bony structures are intact. The paranasal sinuses and mastoid air cells are clear. The globes are intact.  CT CERVICAL SPINE FINDINGS  Degenerative cervical spondylosis with mild multilevel disc disease and facet disease. The cervical vertebral bodies are normally aligned. No acute fracture. The facets are normally aligned. Moderate C1-2 degenerative changes but no dens fracture.  The spinal canal is quite generous. No spinal or significant foraminal stenosis. The lung apices are clear.  IMPRESSION: 1. No acute intracranial findings or skull fracture. 2. Mild degenerative cervical spondylosis but no acute cervical spine fracture.   Electronically Signed   By: Rudie Meyer M.D.   On: 01/17/2015 15:21   Ct Cervical Spine Wo Contrast  01/17/2015   CLINICAL DATA:  Larey Seat downstairs today.  Hit head.  EXAM: CT HEAD WITHOUT CONTRAST  CT CERVICAL SPINE WITHOUT CONTRAST  TECHNIQUE: Multidetector CT imaging of the head and cervical spine was performed following the standard protocol  without intravenous contrast. Multiplanar CT image reconstructions of the cervical spine were also generated.  COMPARISON:  Head CT 08/14/2009  FINDINGS: CT HEAD FINDINGS  The ventricles are normal in size and configuration. No extra-axial fluid collections are identified. The gray-white differentiation is normal. No CT findings for acute intracranial process such as hemorrhage or infarction. No mass lesions. The brainstem and cerebellum are grossly normal.  The bony structures are intact. The paranasal sinuses and mastoid air cells are clear. The globes are intact.  CT CERVICAL SPINE FINDINGS  Degenerative cervical spondylosis with mild multilevel disc disease and facet disease. The cervical vertebral bodies are normally aligned. No acute fracture. The facets are normally aligned. Moderate C1-2 degenerative changes but no dens fracture. The spinal canal is quite generous. No spinal or significant foraminal stenosis. The lung apices are clear.  IMPRESSION: 1. No acute intracranial findings or skull fracture. 2. Mild degenerative cervical spondylosis but no acute cervical spine fracture.   Electronically Signed   By: Rudie Meyer M.D.   On: 01/17/2015 15:21   Dg Chest Port 1 View  01/31/2015   Ricarda Frame, MD     01/31/2015  1:33 AM Central Venous Catheter Insertion Procedure Note ISYS TIETJE [id number] 1946/02/02  Procedure: Insertion of Central Venous Catheter Indications: Drug and/or fluid administration  Procedure Details Consent: Risks of procedure as well as the alternatives and risks  of each were explained to the (patient/caregiver).  Consent for  procedure obtained. Time Out: Verified patient identification, verified procedure,  site/side was marked, verified correct patient position, special  equipment/implants available, medications/allergies/relevent  history reviewed, required imaging and test results available.   Performed  Maximum sterile technique was used including antiseptics, cap,  gloves,  gown, hand hygiene, mask and sheet. Skin prep: Chlorhexidine; local anesthetic administered A antimicrobial bonded/coated triple lumen catheter was placed in  the right internal jugular vein using the Seldinger technique.  Evaluation Blood flow good Complications: No apparent complications Patient did tolerate procedure well. Chest X-ray ordered to verify placement.  CXR: pending.  Ricarda Frame 01/31/2015, 1:32 AM    Dg Chest Port 1 View  02/12/2015   CLINICAL DATA:  Acute onset of generalized weakness. Initial encounter.  EXAM: PORTABLE CHEST - 1 VIEW  COMPARISON:  Chest radiograph performed 01/19/2015  FINDINGS: The lungs are well-aerated. Minimal bilateral atelectasis is noted. There is no evidence of pleural effusion or pneumothorax.  The cardiomediastinal silhouette is within normal limits. No acute osseous abnormalities are seen.  IMPRESSION: Minimal bilateral atelectasis noted.  Lungs otherwise clear.   Electronically Signed   By: Roanna Raider M.D.   On: 02/14/2015 02:33   Ct Renal Stone Study  01/19/2015   CLINICAL DATA:  Severe vomiting and hypotension.  EXAM: CT ABDOMEN AND PELVIS WITHOUT CONTRAST  TECHNIQUE: Multidetector CT imaging of the abdomen and pelvis was performed following the standard protocol without IV  contrast.  COMPARISON:  Abdomen CT 07/05/2005  FINDINGS: Lower chest: No acute findings.  Hepatobiliary: No mass visualized on this unenhanced exam. Gallbladder is unremarkable.  Pancreas: No mass or inflammatory process visualized on this unenhanced exam. Diffuse fatty replacement of pancreas noted.  Spleen:  Within normal limits in size.  Adrenal Glands:  No masses identified.  Kidneys/Urinary tract: No evidence of urolithiasis or hydronephrosis.  Stomach/Bowel/Peritoneum: Sigmoid diverticulosis is noted. No evidence of diverticulitis or other inflammatory process. Normal appendix visualized.  Vascular/Lymphatic: No pathologically enlarged lymph nodes identified. No abdominal aortic  aneurysm or other significant retroperitoneal abnormality demonstrated.  Reproductive:  No mass or other significant abnormality noted.  Other:  None.  Musculoskeletal:  No suspicious bone lesions identified.  IMPRESSION: Sigmoid diverticulosis. No radiographic evidence of diverticulitis or other acute findings.   Electronically Signed   By: Myles Rosenthal M.D.   On: 01/19/2015 14:14     CBC  Recent Labs Lab 02/05/2015 0214 01/31/15 0520 02/01/15 0508 02/03/15 0429  WBC 13.1* 21.0* 16.1* 19.7*  HGB 11.2* 9.5* 8.6* 8.3*  HCT 34.5* 29.1* 25.9* 24.7*  PLT 356 260 227 243  MCV 86.1 84.0 84.6 82.6  MCH 27.9 27.3 28.2 27.6  MCHC 32.4 32.5 33.3 33.5  RDW 14.3 14.2 14.6* 14.2  LYMPHSABS 1.5  --   --   --   MONOABS 0.8  --   --   --   EOSABS 0.8*  --   --   --   BASOSABS 0.1  --   --   --     Chemistries   Recent Labs Lab 02/08/2015 0214  02/02/2015 2108 01/31/15 0520 02/01/15 0508 02/02/15 0315 02/03/15 0429  NA 126*  < > 126* 128* 127* 133* 136  K 3.9  < > 3.5 3.4* 3.7 3.7 3.1*  CL 94*  < > 102 108 109 113* 115*  CO2 14*  < > 17* 15* 13* 15* 16*  GLUCOSE 312*  < > 204* 166* 234* 206* 104*  BUN 17  < > 11 9 8 9 11   CREATININE 1.99*  < > 1.53* 1.49* 1.47* 1.25* 1.19*  CALCIUM 8.9  < > 7.3* 6.8* 6.8* 7.6* 7.5*  MG 1.6*  --   --  1.6* 2.2 1.9 1.8  AST 31  --   --   --   --   --   --   ALT 13*  --   --   --   --   --   --   ALKPHOS 155*  --   --   --   --   --   --   BILITOT 1.2  --   --   --   --   --   --   < > = values in this interval not displayed. ------------------------------------------------------------------------------------------------------------------ estimated creatinine clearance is 34.6 mL/min (by C-G formula based on Cr of 1.19). ------------------------------------------------------------------------------------------------------------------ No results for input(s): HGBA1C in the last 72  hours. ------------------------------------------------------------------------------------------------------------------ No results for input(s): CHOL, HDL, LDLCALC, TRIG, CHOLHDL, LDLDIRECT in the last 72 hours. ------------------------------------------------------------------------------------------------------------------ No results for input(s): TSH, T4TOTAL, T3FREE, THYROIDAB in the last 72 hours.  Invalid input(s): FREET3 ------------------------------------------------------------------------------------------------------------------ No results for input(s): VITAMINB12, FOLATE, FERRITIN, TIBC, IRON, RETICCTPCT in the last 72 hours.  Coagulation profile No results for input(s): INR, PROTIME in the last 168 hours.  No results for input(s): DDIMER in the last 72 hours.  Cardiac Enzymes  Recent Labs Lab 02/12/2015 0214  TROPONINI 0.05*   ------------------------------------------------------------------------------------------------------------------  Invalid input(s): POCBNP    Assessment & Plan   #1 DKA (diabetic ketoacidoses) -on insulin drip now. Possibly can wean off the insulin drip. Because he's she started on the diet. Appreciate endocrinology consult. #2 Dehydration ;improving; #3 septic shock:due to cdiff colitis. Continue Flagyl.wean off levaphed.   #4 . Acute renal failure with hyponatremia: improving with fluids.hypokalemia;replace. Has ckd stgae 3;appreciate nephro f ollowing,  #5recent ribs fracture- when necessary pain control, incentive spirometry try to avoid narcotics which may be playing a role in her lethargy and weakness #6 Hypothyroidism - continue  Iv levothyroxine 7.acute respiratory failure due to bilateral pneumonia;continue vanco, zosyn,o2  #8. Monitor nutrition contest of her acute illness started on dysphagia 1 diet by speech, continue Carnation instant strength breakfast.  7. CODE STATUS DO NOT RESUSCITATE. Discussed this with patient's  daughter did express that patient wants to be DO NOT RESUSCITATE. We will place the order in the chart.     Code Status Orders        Start     Ordered   02/08/2015 0505  Full code   Continuous     02/17/2015 0504           Consults endocrinology   DVT Prophylaxis  heparin  Lab Results  Component Value Date   PLT 243 02/03/2015     Time Spent in minutes  45 minutes of critical care time patient still on insulin drip blood glucose and needs to be monitored closely Shanterria Franta M.D on 02/03/2015 at 11:45 AM  Between 7am to 6pm - Pager - [phone]  After 6pm go to [email or web address] - password [email or web address] [facility]  [facility] [place] [facility]   Office  [phone]

## 2015-02-03 NOTE — Progress Notes (Signed)
Pharmacy Consult for Electrolyte Management   No Known Allergies  Patient Measurements: Height:  (154.9 cm) Weight: 119 lb 11.4 oz (54.3 kg) IBW/kg (Calculated) : 47.8  Vital Signs: Temp: 98 F (36.7 C) (09/05 0715) Temp Source: Oral (09/05 0715) BP: 110/58 mmHg (09/05 0900) Pulse Rate: 85 (09/05 0900) Intake/Output from previous day: 09/04 0701 - 09/05 0700 In: 455.5 [I.V.:155.5; IV Piggyback:300] Out: 750 [Urine:750] Intake/Output from this shift: Total I/O In: -  Out: 50 [Urine:50]  Labs:  Recent Labs  02/01/15 0508 02/03/15 0429  WBC 16.1* 19.7*  HGB 8.6* 8.3*  HCT 25.9* 24.7*  PLT 227 243    Recent Labs  02/01/15 0508 02/02/15 0315 02/03/15 0429  NA 127* 133* 136  K 3.7 3.7 3.1*  CL 109 113* 115*  CO2 13* 15* 16*  GLUCOSE 234* 206* 104*  BUN CREATININE 1.47* 1.25* 1.19*  CALCIUM 6.8* 7.6* 7.5*  MG 2.2 1.9 1.8  PHOS 2.2* 3.2 2.6   Estimated Creatinine Clearance: 34.6 mL/min (by C-G formula based on Cr of 1.19).    Recent Labs  02/03/15 0611 02/03/15 0704 02/03/15 0759  GLUCAP 121* 124* 158*    Medical History: Past Medical History  Diagnosis Date  . Diabetes mellitus type 1     (prior saw Dr. Lodema Hong endo, would like to establish locally)  . Generalized headaches     frequent  . Hypothyroidism   . Convulsions/seizures     unknown type, nml EEG  . History of chicken pox   . Anemia   . Vitamin B12 deficiency 03/2012    normal IF    Medications:  Scheduled:  . antiseptic oral rinse  7 mL Mouth Rinse BID  . heparin  5,000 Units Subcutaneous 3 times per day  . levothyroxine  50 mcg Intravenous Daily  . methylPREDNISolone (SOLU-MEDROL) injection  60 mg Intravenous Q24H  . metronidazole  500 mg Intravenous Q8H  . ofloxacin  1 drop Both Eyes QID  . piperacillin-tazobactam (ZOSYN)  IV  3.375 g Intravenous 3 times per day  . potassium chloride  10 mEq Intravenous Q1 Hr x 4  . sodium chloride  3 mL Intravenous Q12H  .  vancomycin  750 mg Intravenous Q24H   Infusions:  . dextrose 5 % and 0.9% NaCl 50 mL/hr at 02/02/15 2309  . insulin (NOVOLIN-R) infusion 1.1 Units/hr (02/03/15 0408)  . norepinephrine 1 mcg/min (02/03/15 0919)   PRN: acetaminophen **OR** acetaminophen, ipratropium-albuterol, ondansetron **OR** ondansetron (ZOFRAN) IV, sodium chloride, sodium chloride  Assessment: Phamracy consulted to assist in managing electrolytes in this 69 y/o F with DKA.   Plan:  9/5: K = 3.1.  Ordered KCl IV for this morning.  Will recheck electrolytes tomorrow AM.  Pharmacy to follow per consult.   Stormy Card, [facility] Clinical Pharmacist   02/03/2015,9:36 AM

## 2015-02-03 NOTE — Progress Notes (Signed)
Nutrition Follow-up  DOCUMENTATION CODES:   Severe malnutrition in context of acute illness/injury  INTERVENTION:   Meals and Snacks: Cater to patient preferences, likes yogurt, orange sherbet Medical Food Supplement Therapy: pt receiving SugarFree Carnation Instant Breakfast last admission, pt reports she doesn't remember this, agreeable to trying this admission. Will send TID on meal trays   NUTRITION DIAGNOSIS:   Inadequate oral intake related to acute illness as evidenced by NPO status, other (see comment) (thrush in mouth). Improving as diet progressed, taking some po, starting supplement  GOAL:   Patient will meet greater than or equal to 90% of their needs  MONITOR:    (Energy intake, Glucose profile, Electrolyte and renal profile)  ASSESSMENT:    Mentation somewhat improved, alert on visit th this AM,   Diet Order:  DIET - DYS 1 Room service appropriate?: Yes with Assist; Fluid consistency:: Thin   Energy Intake: diet progressed from NPO this AM, pt took applesauce and apple juice with SLP this AM, asking for orange sherbet  Food and Nutrition Related History: pt with minimal intake between admissions (4-5 days), taking only bites per family and experiencing vomting. Pt has been NPO since admission as well  Electrolyte and Renal Profile:  Recent Labs Lab 02/01/15 0508 02/02/15 0315 02/03/15 0429  BUN CREATININE 1.47* 1.25* 1.19*  NA 127* 133* 136  K 3.7 3.7 3.1*  MG 2.2 1.9 1.8  PHOS 2.2* 3.2 2.6   Glucose Profile:   Recent Labs  02/03/15 0611 02/03/15 0704 02/03/15 0759  GLUCAP 121* 124* 158*   Nutritional Anemia Profile:  CBC Latest Ref Rng 02/03/2015 02/01/2015 01/31/2015  WBC 3.6 - 11.0 K/uL 19.7(H) 16.1(H) 21.0(H)  Hemoglobin 12.0 - 16.0 g/dL 8.3(L) 8.6(L) 9.5(L)  Hematocrit 35.0 - 47.0 % 24.7(L) 25.9(L) 29.1(L)  Platelets 150 - 440 K/uL 243 227 260    Meds: D5-1/2NS at 50 ml/hr, insulin drip, solumedrol, flagyl, levophed  Digestive  system: +cdiff, liquid stool, +thrush-improving but pt still reports pain in mouth; no N/V this AM   Nutrition Focused Physical Exam: Nutrition-Focused physical exam completed. Findings are wdl for fat depletion, mild muscle depletion, and no edema.   Height:   Ht Readings from Last 1 Encounters:  02/03/2015  (1.549 m)    Weight: 11% wt loss since May (4 months), 11% wt loss from May to August (3 months), if weight gain on 8/24 accurate, 4.8% wt loss in <1 month  Wt Readings from Last 1 Encounters:  02/19/2015 119 lb 11.4 oz (54.3 kg)    Wt Readings from Last 10 Encounters:  02/20/2015 119 lb 11.4 oz (54.3 kg)  01/22/15 125 lb 3.5 oz (56.8 kg)  01/17/15 117 lb (53.071 kg)  10/03/14 134 lb 14.4 oz (61.19 kg)  07/10/13 132 lb 4 oz (59.988 kg)  12/18/12 135 lb 4 oz (61.349 kg)  06/19/12 122 lb 8 oz (55.566 kg)  04/18/12 128 lb 12 oz (58.401 kg)  03/14/12 127 lb 8 oz (57.834 kg)  01/02/10 138 lb (62.596 kg)     BMI:  Body mass index is 22.63 kg/(m^2).  Estimated Nutritional Needs:   Kcal:  BEE 1012 kcals (IF 1.0-1.2, AF 1.3) 1447-1710 kcals/d.   Protein:  (1.0-1.2 g/kg) 54-65 g/d  Fluid:  (30-81ml/kg) 1620-1861ml/d  EDUCATION NEEDS:   No education needs identified at this time   HIGH Care Level  Romelle Starcher MS, RD, LDN [phone] Pager

## 2015-02-03 NOTE — Evaluation (Signed)
Clinical/Bedside Swallow Evaluation Patient Details  Name: [name] MRN: [id number] Date of Birth: June 19, 1945  Today's Date: 02/03/2015 Time: SLP Start Time (ACUTE ONLY): 0745 SLP Stop Time (ACUTE ONLY): 0845 SLP Time Calculation (min) (ACUTE ONLY): 60 min  Past Medical History:  Past Medical History  Diagnosis Date  . Diabetes mellitus type 1     (prior saw Dr. Lodema Hong endo, would like to establish locally)  . Generalized headaches     frequent  . Hypothyroidism   . Convulsions/seizures     unknown type, nml EEG  . History of chicken pox   . Anemia   . Vitamin B12 deficiency 03/2012    normal IF   Past Surgical History:  Past Surgical History  Procedure Laterality Date  . Cesarean section  1983   HPI:  Pt is a 69 y.o. female who presents with hypotension. Patient states that she recently had a fall last week and broke several ribs. She had DKA at that time, and on discharge was sent to [facility] nursing facility. There she was noted to have thrush and has been treated the last several days, though she feels is ineffective. Due to her thrush she had poor by mouth intake during that time. On the day prior to admission she was noted to have a low blood pressure, and looked very lethargic and felt very weak. On evaluation in the ED she was noted to be tachycardic, with a slightly elevated white blood count, low blood pressure. She is felt to be severely dehydrated, her lactic acid was 2.5. She also seemed to be in mild DKA. Pt awakened and was alert to person/place. She followed basic instructions appropriately. She presented w/ slower motor movements overall but was able to feed self w/ setup and assist.    Assessment / Plan / Recommendation Clinical Impression  Pt presented w/ adequate oropharyngeal phase swallow function w/ trials of thin liquids and purees given at this evaluation today. Pt appeared to present w/ reduced risk for aspiration; no coughing or other overt  s/s of aspiration were noted during intake. Oral phase appeared wfl w/ trials. Pt was able to feed self w/ min. assist; she appeared weak overall. Rec. initiation of a pureed diet w/ thin liquids at this time w/ ST services to f/u w/ trials to upgrade diet consistency as tolerates. NSG updated. Pt agreed.    Aspiration Risk   (reduced)    Diet Recommendation Dysphagia 1 (Puree);Thin   Medication Administration: Whole meds with puree (or crushed as nec/able) Compensations: Minimize environmental distractions;Slow rate;Small sips/bites    Other  Recommendations Recommended Consults:  (Dietician) Oral Care Recommendations: Oral care BID;Staff/trained caregiver to provide oral care   Follow Up Recommendations       Frequency and Duration min 3x week  1 week   Pertinent Vitals/Pain denied    SLP Swallow Goals  see care plan  Swallow Study Prior Functional Status   recently has been at [facility]. Pt has had Thrush recently at [facility] and was treated - continue to monitor for such.    General Date of Onset: 02/05/2015 Other Pertinent Information: Pt is a 69 y.o. female who presents with hypotension. Patient states that she recently had a fall last week and broke several ribs. She had DKA at that time, and on discharge was sent to [facility] nursing facility. There she was noted to have thrush and has been treated the last several days, though she feels is ineffective. Due to  her thrush she had poor by mouth intake during that time. On the day prior to admission she was noted to have a low blood pressure, and looked very lethargic and felt very weak. On evaluation in the ED she was noted to be tachycardic, with a slightly elevated white blood count, low blood pressure. She is felt to be severely dehydrated, her lactic acid was 2.5. She also seemed to be in mild DKA. Pt awakened and was alert to person/place. She followed basic instructions appropriately. She presented w/ slower motor movements overall  but was able to feed self w/ setup and assist.  Type of Study: Bedside swallow evaluation Previous Swallow Assessment: none Diet Prior to this Study:  (unsure) Temperature Spikes Noted: No (wbc 19.7) Respiratory Status: Room air History of Recent Intubation: No Behavior/Cognition: Alert;Cooperative;Pleasant mood;Requires cueing Oral Cavity - Dentition: Adequate natural dentition/normal for age;Missing dentition Self-Feeding Abilities: Needs assist;Needs set up;Able to feed self Patient Positioning: Upright in bed Baseline Vocal Quality: Normal;Low vocal intensity Volitional Cough:  (Fair) Volitional Swallow: Able to elicit    Oral/Motor/Sensory Function Overall Oral Motor/Sensory Function: Appears within functional limits for tasks assessed Labial ROM: Within Functional Limits Labial Symmetry: Within Functional Limits Labial Strength: Within Functional Limits Lingual ROM: Within Functional Limits Lingual Symmetry: Within Functional Limits Lingual Strength: Within Functional Limits Facial Symmetry: Within Functional Limits Mandible: Within Functional Limits   Ice Chips Ice chips: Within functional limits Presentation: Spoon (fed; 4 trials)   Thin Liquid Thin Liquid: Within functional limits Presentation: Cup;Self Fed;Straw (min. assist; ~4 ozs total)    Nectar Thick Nectar Thick Liquid: Not tested   Honey Thick Honey Thick Liquid: Not tested   Puree Puree: Within functional limits Presentation: Spoon;Self Fed (assisted; ~2 ozs)   Solid   GO    Solid: Not tested      Jerilynn Som, MS, CCC-SLP  Watson,Katherine 02/03/2015,10:17 AM

## 2015-02-04 ENCOUNTER — Inpatient Hospital Stay: Payer: Medicare Other

## 2015-02-04 LAB — GLUCOSE, CAPILLARY
GLUCOSE-CAPILLARY: 101 mg/dL — AB (ref 65–99)
GLUCOSE-CAPILLARY: 103 mg/dL — AB (ref 65–99)
GLUCOSE-CAPILLARY: 120 mg/dL — AB (ref 65–99)
GLUCOSE-CAPILLARY: 122 mg/dL — AB (ref 65–99)
GLUCOSE-CAPILLARY: 145 mg/dL — AB (ref 65–99)
GLUCOSE-CAPILLARY: 154 mg/dL — AB (ref 65–99)
GLUCOSE-CAPILLARY: 168 mg/dL — AB (ref 65–99)
GLUCOSE-CAPILLARY: 168 mg/dL — AB (ref 65–99)
GLUCOSE-CAPILLARY: 191 mg/dL — AB (ref 65–99)
GLUCOSE-CAPILLARY: 201 mg/dL — AB (ref 65–99)
GLUCOSE-CAPILLARY: 203 mg/dL — AB (ref 65–99)
GLUCOSE-CAPILLARY: 207 mg/dL — AB (ref 65–99)
GLUCOSE-CAPILLARY: 217 mg/dL — AB (ref 65–99)
GLUCOSE-CAPILLARY: 92 mg/dL (ref 65–99)
GLUCOSE-CAPILLARY: 99 mg/dL (ref 65–99)
Glucose-Capillary: 108 mg/dL — ABNORMAL HIGH (ref 65–99)
Glucose-Capillary: 255 mg/dL — ABNORMAL HIGH (ref 65–99)
Glucose-Capillary: 173 mg/dL — ABNORMAL HIGH (ref 65–99)
Glucose-Capillary: 85 mg/dL (ref 65–99)
Glucose-Capillary: 119 mg/dL — ABNORMAL HIGH (ref 65–99)
Glucose-Capillary: 218 mg/dL — ABNORMAL HIGH (ref 65–99)
Glucose-Capillary: 232 mg/dL — ABNORMAL HIGH (ref 65–99)
Glucose-Capillary: 151 mg/dL — ABNORMAL HIGH (ref 65–99)
Glucose-Capillary: 109 mg/dL — ABNORMAL HIGH (ref 65–99)
Glucose-Capillary: 69 mg/dL (ref 65–99)
Glucose-Capillary: 211 mg/dL — ABNORMAL HIGH (ref 65–99)

## 2015-02-04 LAB — BASIC METABOLIC PANEL
ANION GAP: 9 (ref 5–15)
BUN: 15 mg/dL (ref 6–20)
CALCIUM: 7.6 mg/dL — AB (ref 8.9–10.3)
CO2: 13 mmol/L — ABNORMAL LOW (ref 22–32)
CREATININE: 1.22 mg/dL — AB (ref 0.44–1.00)
Chloride: 112 mmol/L — ABNORMAL HIGH (ref 101–111)
GFR calc Af Amer: 52 mL/min — ABNORMAL LOW (ref >60)
GFR, EST NON AFRICAN AMERICAN: 45 mL/min — AB (ref >60)
GLUCOSE: 217 mg/dL — AB (ref 65–99)
Potassium: 4.1 mmol/L (ref 3.5–5.1)
Sodium: 134 mmol/L — ABNORMAL LOW (ref 135–145)

## 2015-02-04 LAB — CULTURE, BLOOD (ROUTINE X 2): Culture: NO GROWTH

## 2015-02-04 LAB — CBC
HCT: 26.8 % — ABNORMAL LOW (ref 35.0–47.0)
Hemoglobin: 9.1 g/dL — ABNORMAL LOW (ref 12.0–16.0)
MCH: 28.5 pg (ref 26.0–34.0)
MCHC: 34 g/dL (ref 32.0–36.0)
MCV: 84 fL (ref 80.0–100.0)
PLATELETS: 259 10*3/uL (ref 150–440)
RBC: 3.18 MIL/uL — ABNORMAL LOW (ref 3.80–5.20)
RDW: 14.4 % (ref 11.5–14.5)
WBC: 16.7 10*3/uL — AB (ref 3.6–11.0)

## 2015-02-04 LAB — MAGNESIUM: Magnesium: 1.6 mg/dL — ABNORMAL LOW (ref 1.7–2.4)

## 2015-02-04 LAB — PHOSPHORUS: PHOSPHORUS: 2.3 mg/dL — AB (ref 2.5–4.6)

## 2015-02-04 MED ORDER — PREDNISONE 20 MG PO TABS
40.0000 mg | ORAL_TABLET | Freq: Every day | ORAL | Status: DC
  Administered 2015-02-05: 40 mg via ORAL
  Filled 2015-02-04: qty 2

## 2015-02-04 MED ORDER — DEXTROSE 50 % IV SOLN
12.0000 mL | Freq: Once | INTRAVENOUS | Status: AC
  Administered 2015-02-04 (×2): 12 mL via INTRAVENOUS

## 2015-02-04 MED ORDER — SODIUM BICARBONATE 650 MG PO TABS
650.0000 mg | ORAL_TABLET | Freq: Three times a day (TID) | ORAL | Status: DC
  Administered 2015-02-04 – 2015-02-15 (×34): 650 mg via ORAL
  Filled 2015-02-04 (×36): qty 1

## 2015-02-04 MED ORDER — DEXTROSE 50 % IV SOLN
INTRAVENOUS | Status: AC
  Administered 2015-02-04: 12 mL via INTRAVENOUS
  Filled 2015-02-04: qty 50

## 2015-02-04 MED ORDER — SODIUM PHOSPHATE 3 MMOLE/ML IV SOLN
10.0000 mmol | Freq: Once | INTRAVENOUS | Status: AC
  Administered 2015-02-04: 10 mmol via INTRAVENOUS
  Filled 2015-02-04: qty 3.33

## 2015-02-04 MED ORDER — MAGNESIUM SULFATE 2 GM/50ML IV SOLN
2.0000 g | Freq: Once | INTRAVENOUS | Status: AC
  Administered 2015-02-04: 2 g via INTRAVENOUS
  Filled 2015-02-04: qty 50

## 2015-02-04 MED ORDER — ENOXAPARIN SODIUM 40 MG/0.4ML ~~LOC~~ SOLN
40.0000 mg | SUBCUTANEOUS | Status: DC
  Administered 2015-02-04 – 2015-02-15 (×12): 40 mg via SUBCUTANEOUS
  Filled 2015-02-04 (×12): qty 0.4

## 2015-02-04 NOTE — Progress Notes (Signed)
ANTIBIOTIC CONSULT NOTE - Follow up  Pharmacy Consult for vancomycin/Zosyn Indication: PNA  No Known Allergies  Patient Measurements: Height: 5\' 1"  (154.9 cm) Weight: 119 lb 11.4 oz (54.3 kg) IBW/kg (Calculated) : 47.8 Adjusted Body Weight: 50 kg  Vital Signs: Temp: 97.9 F (36.6 C) (09/06 0800) Temp Source: Oral (09/06 0800) BP: 102/51 mmHg (09/06 0600) Pulse Rate: 88 (09/06 0600) Intake/Output from previous day: 09/05 0701 - 09/06 0700 In: 2356.4 [I.V.:1306.4; IV Piggyback:1050] Out: 500 [Urine:500] Intake/Output from this shift:    Labs:  Recent Labs  02/02/15 0315 02/03/15 0429 02/04/15 0455  WBC  --  19.7* 16.7*  HGB  --  8.3* 9.1*  PLT  --  243 259  CREATININE 1.25* 1.19* 1.22*   Estimated Creatinine Clearance: 33.8 mL/min (by C-G formula based on Cr of 1.22).  Recent Labs  02/02/15 0315  [facility] 16     Microbiology: Recent Results (from the past 720 hour(s))  MRSA PCR Screening     Status: None   Collection Time: 01/20/15  1:15 PM  Result Value Ref Range Status   MRSA by PCR NEGATIVE NEGATIVE Final    Comment:        The GeneXpert MRSA Assay (FDA approved for NASAL specimens only), is one component of a comprehensive MRSA colonization surveillance program. It is not intended to diagnose MRSA infection nor to guide or monitor treatment for MRSA infections.   Urine culture     Status: None   Collection Time: 01/22/15 12:55 PM  Result Value Ref Range Status   Specimen Description URINE, RANDOM  Final   Special Requests NONE  Final   Culture NO GROWTH 1 DAY  Final   Report Status 01/23/2015 FINAL  Final  Culture, blood (routine x 2)     Status: None   Collection Time: Feb 04, 2015  3:34 AM  Result Value Ref Range Status   Specimen Description BLOOD BLOOD RIGHT FOREARM  Final   Special Requests BOTTLES DRAWN AEROBIC AND ANAEROBIC  Final   Culture  Setup Time   Final    GRAM POSITIVE RODS IN BOTH AEROBIC AND ANAEROBIC BOTTLES CRITICAL  RESULT CALLED TO, READ BACK BY AND VERIFIED WITH: BRITTANY RUDD ON 04-Feb-2015 AT 1752 BY TB CONFIRMED BY TB/MS.    Culture   Final    BACILLUS SPECIES IN BOTH AEROBIC AND ANAEROBIC BOTTLES Results consistent with contamination.    Report Status 02/03/2015 FINAL  Final  Culture, blood (routine x 2)     Status: None   Collection Time: 2015/02/04  3:34 AM  Result Value Ref Range Status   Specimen Description BLOOD RIGHT HAND  Final   Special Requests BOTTLES DRAWN AEROBIC AND ANAEROBIC  Final   Culture NO GROWTH 5 DAYS  Final   Report Status 02/04/2015 FINAL  Final  MRSA PCR Screening     Status: None   Collection Time: Feb 04, 2015  5:15 AM  Result Value Ref Range Status   MRSA by PCR NEGATIVE NEGATIVE Final    Comment:        The GeneXpert MRSA Assay (FDA approved for NASAL specimens only), is one component of a comprehensive MRSA colonization surveillance program. It is not intended to diagnose MRSA infection nor to guide or monitor treatment for MRSA infections.   C difficile quick scan w PCR reflex     Status: Abnormal   Collection Time: 02/02/15  4:12 PM  Result Value Ref Range Status   C Diff antigen POSITIVE (A)  NEGATIVE Final   C Diff toxin POSITIVE (A) NEGATIVE Final   C Diff interpretation   Final    Positive for toxigenic C. difficile, active toxin production present.    Comment: CRITICAL RESULT CALLED TO, READ BACK BY AND VERIFIED WITH: CHRISTINA MILES ON 02/02/15 AT 1715 BY [facility]     Medical History: Past Medical History  Diagnosis Date  . Diabetes mellitus type 1     (prior saw Dr. Lodema Hong endo, would like to establish locally)  . Generalized headaches     frequent  . Hypothyroidism   . Convulsions/seizures     unknown type, nml EEG  . History of chicken pox   . Anemia   . Vitamin B12 deficiency 03/2012    normal IF    Medications:  Infusions:  . dextrose 5 % and 0.9% NaCl 50 mL/hr at 02/04/15 0831  . insulin (NOVOLIN-R) infusion 0.3 Units/hr (02/04/15  0915)  . norepinephrine 2 mcg/min (02/04/15 0150)   Assessment:   Pharmacy consulted to dose vancomycin and Zosyn for 69 yo female on antibiotics for sepsis. Patient is currently ordered vancomycin  IV Q24hr and Zosyn EI 3.375g IV Q8hr.    Goal of Therapy:  Vancomycin trough level 15-20 mcg/ml  Plan:  Vancomycin level therapeutic on 9/4 and SCr remains stable. Will continue current therapy.   Pharmacy will continue to monitor and adjust per consult.  Luisa Hart, PharmD Clinical Pharmacist   02/04/2015,11:11 AM

## 2015-02-04 NOTE — Progress Notes (Signed)
Speech Language Pathology Treatment: Dysphagia  Patient Details Name: [name] MRN: [id number] DOB: 12-Dec-1945 Today's Date: 02/04/2015 Time: 4098-1191 SLP Time Calculation (min) (ACUTE ONLY): 45 min  Assessment / Plan / Recommendation Clinical Impression  Pt appears to be tolerating her current diet and trials of a Dys. Level 2 diet w/ thin liquids w/ no overt s/s of aspiration noted; no significant oral phase deficits noted w/ these consistencies. Pt continues to present w/ sleepiness; fatigue overall and would benefit from a modified diet. Pt requires tray setup at meals. ST will f/u w/ further trials to upgrade diet consistency as appropriate. NSG updated.    HPI Other Pertinent Information: Pt is a 69 y.o. female who presents with hypotension. Patient states that she recently had a fall last week and broke several ribs. She had DKA at that time, and on discharge was sent to [facility] nursing facility. There she was noted to have thrush and has been treated the last several days, though she feels is ineffective. Due to her thrush she had poor by mouth intake during that time. On the day prior to admission she was noted to have a low blood pressure, and looked very lethargic and felt very weak. On evaluation in the ED she was noted to be tachycardic, with a slightly elevated white blood count, low blood pressure. She is felt to be severely dehydrated, her lactic acid was 2.5. She also seemed to be in mild DKA. Pt awakened and was alert to person/place. She followed basic instructions appropriately. She presented w/ slower motor movements overall but was able to feed self w/ setup and assist. Pt is tolerating her po diet but not eating much per NSG report.    Pertinent Vitals Pain Assessment: No/denies pain  SLP Plan  Continue with current plan of care    Recommendations Diet recommendations: Dysphagia 2 (fine chop);Thin liquid Liquids provided via: Cup;Straw Medication  Administration: Whole meds with puree Supervision: Patient able to self feed;Intermittent supervision to cue for compensatory strategies (tray setup at meals) Compensations: Minimize environmental distractions;Slow rate;Small sips/bites Postural Changes and/or Swallow Maneuvers: Seated upright 90 degrees              General recommendations:  (TBD) Oral Care Recommendations: Oral care BID;Staff/trained caregiver to provide oral care Follow up Recommendations: Skilled Nursing facility (TBD) Plan: Continue with current plan of care    GO    Jerilynn Som, MS, CCC-SLP  [name] 02/04/2015, 4:01 PM

## 2015-02-04 NOTE — Progress Notes (Signed)
[facility] - [place] at [facility]                                                                                                                                                                                            Patient Demographics   [name], is a 69 y.o. female, DOB - 08/14/1945, WUJ:[id number]  Admit date - 02/19/2015   Admitting Physician Oralia Manis, MD  Outpatient Primary MD for the patient is Lauro Regulus., MD   LOS - 5  Subjective: Patient was recently hospitalized with nausea vomiting and DKA. He was discharged to rehabilitation. Patient at the skilled nursing facility was noted to have hypotension.on insulin drip since admission. Also was lethargic for the past few days. But today she is more alert oriented. Stool C. difficile is positive and started on Flagyl .  SEEN TODAY PATIENT IS MORE ALERT, ORIENTED. NO SHORTNESS OF BREATH. NO ABDOMINAL PAIN, NO DIARRHEA. TOLERATING THE DIET.   Review of Systems:   Limited due to patient being lethargic   Vitals:   Filed Vitals:   02/04/15 0400 02/04/15 0500 02/04/15 0600 02/04/15 0800  BP: 86/65 108/62 102/51   Pulse: 84 87 88   Temp: 98.4 F (36.9 C)   97.9 F (36.6 C)  TempSrc:    Oral  Resp: Height:      Weight:      SpO2: 99% 98% 98%     Wt Readings from Last 3 Encounters:  19-Feb-2015 54.3 kg (119 lb 11.4 oz)  01/22/15 56.8 kg (125 lb 3.5 oz)  01/17/15 53.071 kg (117 lb)     Intake/Output Summary (Last 24 hours) at 02/04/15 1022 Last data filed at 02/04/15 0607  Gross per 24 hour  Intake 2297.05 ml  Output    450 ml  Net 1847.05 ml    Physical Exam:   GENERAL lethargic HEAD, EYES, EARS, NOSE AND THROAT: Atraumatic, normocephalic. Extraocular muscles are intact. Pupils equal and reactive to light. Sclerae anicteric. No conjunctival injection. No oro-pharyngeal erythema.  NECK: Supple. There is no jugular venous distention. No bruits, no lymphadenopathy,  no thyromegaly.  HEART: Regular rate and rhythm,. No murmurs, no rubs, no clicks.  LUNGS: Bilateral expiratory wheeze in all lung fields. ABDOMEN: Soft, flat, nontender, nondistended. Has good bowel sounds. No hepatosplenomegaly appreciated.  EXTREMITIES: No evidence of any cyanosis, clubbing, or peripheral edema.  +2 pedal and radial pulses bilaterally.  NEUROLOGIC: Patient is sleepy but able to move all extremities no focal deficit appreciated SKIN: Moist and warm with no rashes appreciated.  Psych: Not anxious, depressed LN: No  inguinal LN enlargement    Antibiotics   Anti-infectives    Start     Dose/Rate Route Frequency Ordered Stop   02/02/15 1815  metroNIDAZOLE (FLAGYL) IVPB 500 mg     500 mg 100 mL/hr over 60 Minutes Intravenous Every 8 hours 02/02/15 1804     01/31/15 1600  vancomycin (VANCOCIN) IVPB 750 mg/150 ml premix  Status:  Discontinued     750 mg 150 mL/hr over 60 Minutes Intravenous Every 36 hours 02/13/2015 0516 02/01/2015 1228   01/31/15 0400  vancomycin (VANCOCIN) IVPB 750 mg/150 ml premix     750 mg 150 mL/hr over 60 Minutes Intravenous Every 24 hours 02/21/2015 1228     02/24/2015 0600  piperacillin-tazobactam (ZOSYN) IVPB 3.375 g     3.375 g 12.5 mL/hr over 240 Minutes Intravenous 3 times per day 02/10/2015 0516     02/14/2015 0345  vancomycin (VANCOCIN) IVPB 1000 mg/200 mL premix     1,000 mg 200 mL/hr over 60 Minutes Intravenous  Once 02/05/2015 0331 01/31/2015 0506   02/09/2015 0345  piperacillin-tazobactam (ZOSYN) IVPB 3.375 g  Status:  Discontinued     3.375 g 12.5 mL/hr over 240 Minutes Intravenous  Once 02/08/2015 0331 02/25/2015 0745      Medications   Scheduled Meds: . antiseptic oral rinse  7 mL Mouth Rinse BID  . heparin  5,000 Units Subcutaneous 3 times per day  . levothyroxine  50 mcg Intravenous Daily  . methylPREDNISolone (SOLU-MEDROL) injection  60 mg Intravenous Q24H  . metronidazole  500 mg Intravenous Q8H  . ofloxacin  1 drop Both Eyes QID  .  piperacillin-tazobactam (ZOSYN)  IV  3.375 g Intravenous 3 times per day  . sodium chloride  3 mL Intravenous Q12H  . sodium phosphate  Dextrose 5% IVPB  10 mmol Intravenous Once  . vancomycin  750 mg Intravenous Q24H   Continuous Infusions: . dextrose 5 % and 0.9% NaCl 50 mL/hr at 02/04/15 0831  . insulin (NOVOLIN-R) infusion 0.3 Units/hr (02/04/15 0915)  . norepinephrine 2 mcg/min (02/04/15 0150)   PRN Meds:.acetaminophen **OR** acetaminophen, ipratropium-albuterol, ondansetron **OR** ondansetron (ZOFRAN) IV, sodium chloride, sodium chloride   Data Review:   Micro Results Recent Results (from the past 240 hour(s))  Culture, blood (routine x 2)     Status: None   Collection Time: 02/19/2015  3:34 AM  Result Value Ref Range Status   Specimen Description BLOOD BLOOD RIGHT FOREARM  Final   Special Requests BOTTLES DRAWN AEROBIC AND ANAEROBIC  Final   Culture  Setup Time   Final    GRAM POSITIVE RODS IN BOTH AEROBIC AND ANAEROBIC BOTTLES CRITICAL RESULT CALLED TO, READ BACK BY AND VERIFIED WITH: BRITTANY RUDD ON 02/14/2015 AT 1752 BY TB CONFIRMED BY TB/MS.    Culture   Final    BACILLUS SPECIES IN BOTH AEROBIC AND ANAEROBIC BOTTLES Results consistent with contamination.    Report Status 02/03/2015 FINAL  Final  Culture, blood (routine x 2)     Status: None   Collection Time: 02/04/2015  3:34 AM  Result Value Ref Range Status   Specimen Description BLOOD RIGHT HAND  Final   Special Requests BOTTLES DRAWN AEROBIC AND ANAEROBIC  Final   Culture NO GROWTH 5 DAYS  Final   Report Status 02/04/2015 FINAL  Final  MRSA PCR Screening     Status: None   Collection Time: 02/01/2015  5:15 AM  Result Value Ref Range Status   MRSA by PCR NEGATIVE NEGATIVE  Final    Comment:        The GeneXpert MRSA Assay (FDA approved for NASAL specimens only), is one component of a comprehensive MRSA colonization surveillance program. It is not intended to diagnose MRSA infection nor to guide  or monitor treatment for MRSA infections.   C difficile quick scan w PCR reflex     Status: Abnormal   Collection Time: 02/02/15  4:12 PM  Result Value Ref Range Status   C Diff antigen POSITIVE (A) NEGATIVE Final   C Diff toxin POSITIVE (A) NEGATIVE Final   C Diff interpretation   Final    Positive for toxigenic C. difficile, active toxin production present.    Comment: CRITICAL RESULT CALLED TO, READ BACK BY AND VERIFIED WITH: CHRISTINA MILES ON 02/02/15 AT 1715 BY [facility]     Radiology Reports Dg Chest 1 View  02/01/2015   CLINICAL DATA:  69 year old female admitted to the hospital on 2015/02/26 for hypotension. New onset of wheezing.  EXAM: CHEST  1 VIEW  COMPARISON:  Chest x-ray 01/31/2015.  FINDINGS: There is a right-sided internal jugular central venous catheter with tip terminating in the distal superior vena cava. Lung volumes are low. Patchy multifocal asymmetrically distributed interstitial and airspace disease, most evident throughout the mid to lower lungs bilaterally (left greater than right). Small bilateral pleural effusions (left greater than right). Pulmonary vasculature does not appear engorged. Heart size left is normal. The patient is rotated to the left on today's exam, resulting in distortion of the mediastinal contours and reduced diagnostic sensitivity and specificity for mediastinal pathology. Atherosclerosis in the thoracic aorta.  IMPRESSION: 1. Interval development of asymmetrically distributed patchy multifocal interstitial and airspace disease in the lungs bilaterally (left greater than right), concerning for multilobar pneumonia. 2. Small bilateral pleural effusions (left greater than right). 3. Atherosclerosis.   Electronically Signed   By: Trudie Reed M.D.   On: 02/01/2015 11:45   Dg Chest 2 View  01/19/2015   CLINICAL DATA:  Status post left fourth through sixth rib fractures 01/17/2015 after a fall. Vomiting and diarrhea today. Subsequent encounter.  EXAM: CHEST   2 VIEW  COMPARISON:  Plain films of the chest and left ribs 01/17/2015. PA and lateral chest 05/15/2013. CT chest 01/06/2010.  FINDINGS: Left fourth through seventh rib fractures are noted. No new fracture is identified. The lungs are clear. No pneumothorax or pleural effusion is identified. Heart size is normal. Aortic atherosclerosis is noted.  IMPRESSION: Left fourth through seventh rib fractures. Negative for pneumothorax or acute cardiopulmonary disease.   Electronically Signed   By: Drusilla Kanner M.D.   On: 01/19/2015 14:14   Dg Ribs Unilateral W/chest Left  01/17/2015   CLINICAL DATA:  Status post fall today complaining of left posterior rib pain.  EXAM: LEFT RIBS AND CHEST - 3+ VIEW  COMPARISON:  May 15, 2013  FINDINGS: There are displaced fractures of the left fourth, fifth, sixth, seventh ribs. There questioned fractures of the left second and third ribs. There is no pneumothorax. The lungs are clear. The mediastinal contour and cardiac silhouette are normal.  IMPRESSION: Fractures of left ribs as described.   Electronically Signed   By: Sherian Rein M.D.   On: 01/17/2015 16:53   Dg Thoracic Spine 2 View  01/17/2015   CLINICAL DATA:  Patient status post fall while walking up the stairs. Left posterior rib and thoracic spine pain worse when breathing.  EXAM: THORACIC SPINE 2 VIEWS  COMPARISON:  Chest radiograph  05/15/2013  FINDINGS: Superior thoracic vertebral bodies are not well visualized. The mid and lower thoracic vertebral bodies are visualized and appear in normal height with normal intervertebral disc spaces. There is rightward curvature of the proximal thoracic spine demonstrated on the AP view. Multiple left-sided rib fractures, incompletely evaluated on current exam.  IMPRESSION: Rightward curvature of the proximal thoracic spine. Recommend correlation for point tenderness at this location.  Otherwise relative preservation the vertebral body heights.  Incompletely visualized  probable left rib fractures. See dedicated chest and rib report.   Electronically Signed   By: Annia Belt M.D.   On: 01/17/2015 16:52   Ct Head Wo Contrast  01/31/2015   CLINICAL DATA:  Lethargy and decreased responsiveness today.  EXAM: CT HEAD WITHOUT CONTRAST  TECHNIQUE: Contiguous axial images were obtained from the base of the skull through the vertex without intravenous contrast.  COMPARISON:  CT head without contrast 01/19/2015. MRI brain 08/15/2009.  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 No acute infarct, hemorrhage, or mass lesion is otherwise present. The ventricles are of normal size. No significant extra-axial fluid collection is present. The basal ganglia are intact. The insular ribbon is intact.  The paranasal sinuses and mastoid air cells are clear.  IMPRESSION: 1. No acute intracranial abnormality or significant interval change. 2. 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   Electronically Signed   By: Marin Roberts M.D.   On: 01/31/2015 16:26   Ct Head Wo Contrast  01/19/2015   CLINICAL DATA:  Intractable vomiting over the last few days.  EXAM: CT HEAD WITHOUT CONTRAST  TECHNIQUE: Contiguous axial images were obtained from the base of the skull through the vertex without intravenous contrast.  COMPARISON:  Head CT 01/17/2015  FINDINGS: Brainstem is normal. Chronically known 1 cm lesion of the left cerebellum is again visible, consistent with a chronic cavernous angioma. Some adjacent brain low density is present as has been seen previously. No sign that there is any active process in this region. The cerebral hemispheres are normal. No evidence of old or acute infarction, mass lesion, hemorrhage, hydrocephalus or extra-axial collection. Chronic benign left calvarial lucency likely related to a hemangioma. No acute calvarial finding. Sinuses, middle ears and mastoids are clear.  IMPRESSION: No acute  intracranial finding. 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 Based on the unchanged appearance, this would be on likely to relate to the clinical presentation.   Electronically Signed   By: Paulina Fusi M.D.   On: 01/19/2015 13:31   Ct Head Wo Contrast  01/17/2015   CLINICAL DATA:  Larey Seat downstairs today.  Hit head.  EXAM: CT HEAD WITHOUT CONTRAST  CT CERVICAL SPINE WITHOUT CONTRAST  TECHNIQUE: Multidetector CT imaging of the head and cervical spine was performed following the standard protocol without intravenous contrast. Multiplanar CT image reconstructions of the cervical spine were also generated.  COMPARISON:  Head CT 08/14/2009  FINDINGS: CT HEAD FINDINGS  The ventricles are normal in size and configuration. No extra-axial fluid collections are identified. The gray-white differentiation is normal. No CT findings for acute intracranial process such as hemorrhage or infarction. No mass lesions. The brainstem and cerebellum are grossly normal.  The bony structures are intact. The paranasal sinuses and mastoid air cells are clear. The globes are intact.  CT CERVICAL SPINE FINDINGS  Degenerative cervical spondylosis with mild multilevel disc disease and facet disease. The cervical vertebral bodies are  normally aligned. No acute fracture. The facets are normally aligned. Moderate C1-2 degenerative changes but no dens fracture. The spinal canal is quite generous. No spinal or significant foraminal stenosis. The lung apices are clear.  IMPRESSION: 1. No acute intracranial findings or skull fracture. 2. Mild degenerative cervical spondylosis but no acute cervical spine fracture.   Electronically Signed   By: Rudie Meyer M.D.   On: 01/17/2015 15:21   Ct Cervical Spine Wo Contrast  01/17/2015   CLINICAL DATA:  Larey Seat downstairs today.  Hit head.  EXAM: CT HEAD WITHOUT CONTRAST  CT CERVICAL SPINE WITHOUT CONTRAST  TECHNIQUE: Multidetector CT imaging of the head and cervical  spine was performed following the standard protocol without intravenous contrast. Multiplanar CT image reconstructions of the cervical spine were also generated.  COMPARISON:  Head CT 08/14/2009  FINDINGS: CT HEAD FINDINGS  The ventricles are normal in size and configuration. No extra-axial fluid collections are identified. The gray-white differentiation is normal. No CT findings for acute intracranial process such as hemorrhage or infarction. No mass lesions. The brainstem and cerebellum are grossly normal.  The bony structures are intact. The paranasal sinuses and mastoid air cells are clear. The globes are intact.  CT CERVICAL SPINE FINDINGS  Degenerative cervical spondylosis with mild multilevel disc disease and facet disease. The cervical vertebral bodies are normally aligned. No acute fracture. The facets are normally aligned. Moderate C1-2 degenerative changes but no dens fracture. The spinal canal is quite generous. No spinal or significant foraminal stenosis. The lung apices are clear.  IMPRESSION: 1. No acute intracranial findings or skull fracture. 2. Mild degenerative cervical spondylosis but no acute cervical spine fracture.   Electronically Signed   By: Rudie Meyer M.D.   On: 01/17/2015 15:21   Dg Chest Port 1 View  01/31/2015   Ricarda Frame, MD     01/31/2015  1:33 AM Central Venous Catheter Insertion Procedure Note UNKNOWN FLANNIGAN [id number] 03-Jul-1945  Procedure: Insertion of Central Venous Catheter Indications: Drug and/or fluid administration  Procedure Details Consent: Risks of procedure as well as the alternatives and risks  of each were explained to the (patient/caregiver).  Consent for  procedure obtained. Time Out: Verified patient identification, verified procedure,  site/side was marked, verified correct patient position, special  equipment/implants available, medications/allergies/relevent  history reviewed, required imaging and test results available.   Performed  Maximum sterile  technique was used including antiseptics, cap,  gloves, gown, hand hygiene, mask and sheet. Skin prep: Chlorhexidine; local anesthetic administered A antimicrobial bonded/coated triple lumen catheter was placed in  the right internal jugular vein using the Seldinger technique.  Evaluation Blood flow good Complications: No apparent complications Patient did tolerate procedure well. Chest X-ray ordered to verify placement.  CXR: pending.  Ricarda Frame 01/31/2015, 1:32 AM    Dg Chest Port 1 View  03-01-15   CLINICAL DATA:  Acute onset of generalized weakness. Initial encounter.  EXAM: PORTABLE CHEST - 1 VIEW  COMPARISON:  Chest radiograph performed 01/19/2015  FINDINGS: The lungs are well-aerated. Minimal bilateral atelectasis is noted. There is no evidence of pleural effusion or pneumothorax.  The cardiomediastinal silhouette is within normal limits. No acute osseous abnormalities are seen.  IMPRESSION: Minimal bilateral atelectasis noted.  Lungs otherwise clear.   Electronically Signed   By: Roanna Raider M.D.   On: 2015/03/01 02:33   Ct Renal Stone Study  01/19/2015   CLINICAL DATA:  Severe vomiting and hypotension.  EXAM: CT ABDOMEN AND PELVIS WITHOUT CONTRAST  TECHNIQUE: Multidetector CT imaging of the abdomen and pelvis was performed following the standard protocol without IV contrast.  COMPARISON:  Abdomen CT 07/05/2005  FINDINGS: Lower chest: No acute findings.  Hepatobiliary: No mass visualized on this unenhanced exam. Gallbladder is unremarkable.  Pancreas: No mass or inflammatory process visualized on this unenhanced exam. Diffuse fatty replacement of pancreas noted.  Spleen:  Within normal limits in size.  Adrenal Glands:  No masses identified.  Kidneys/Urinary tract: No evidence of urolithiasis or hydronephrosis.  Stomach/Bowel/Peritoneum: Sigmoid diverticulosis is noted. No evidence of diverticulitis or other inflammatory process. Normal appendix visualized.  Vascular/Lymphatic: No pathologically  enlarged lymph nodes identified. No abdominal aortic aneurysm or other significant retroperitoneal abnormality demonstrated.  Reproductive:  No mass or other significant abnormality noted.  Other:  None.  Musculoskeletal:  No suspicious bone lesions identified.  IMPRESSION: Sigmoid diverticulosis. No radiographic evidence of diverticulitis or other acute findings.   Electronically Signed   By: Myles Rosenthal M.D.   On: 01/19/2015 14:14     CBC  Recent Labs Lab 02/08/2015 0214 01/31/15 0520 02/01/15 0508 02/03/15 0429 02/04/15 0455  WBC 13.1* 21.0* 16.1* 19.7* 16.7*  HGB 11.2* 9.5* 8.6* 8.3* 9.1*  HCT 34.5* 29.1* 25.9* 24.7* 26.8*  PLT 356 260 227 243 259  MCV 86.1 84.0 84.6 82.6 84.0  MCH 27.9 27.3 28.2 27.6 28.5  MCHC 32.4 32.5 33.3 33.5 34.0  RDW 14.3 14.2 14.6* 14.2 14.4  LYMPHSABS 1.5  --   --   --   --   MONOABS 0.8  --   --   --   --   EOSABS 0.8*  --   --   --   --   BASOSABS 0.1  --   --   --   --     Chemistries   Recent Labs Lab 02/10/2015 0214  01/31/15 0520 02/01/15 0508 02/02/15 0315 02/03/15 0429 02/04/15 0455  NA 126*  < > 128* 127* 133* 136 134*  K 3.9  < > 3.4* 3.7 3.7 3.1* 4.1  CL 94*  < > 108 109 113* 115* 112*  CO2 14*  < > 15* 13* 15* 16* 13*  GLUCOSE 312*  < > 166* 234* 206* 104* 217*  BUN 17  < > 9 8 9 11 15   CREATININE 1.99*  < > 1.49* 1.47* 1.25* 1.19* 1.22*  CALCIUM 8.9  < > 6.8* 6.8* 7.6* 7.5* 7.6*  MG 1.6*  --  1.6* 2.2 1.9 1.8 1.6*  AST 31  --   --   --   --   --   --   ALT 13*  --   --   --   --   --   --   ALKPHOS 155*  --   --   --   --   --   --   BILITOT 1.2  --   --   --   --   --   --   < > = values in this interval not displayed. ------------------------------------------------------------------------------------------------------------------ estimated creatinine clearance is 33.8 mL/min (by C-G formula based on Cr of  1.22). ------------------------------------------------------------------------------------------------------------------ No results for input(s): HGBA1C in the last 72 hours. ------------------------------------------------------------------------------------------------------------------ No results for input(s): CHOL, HDL, LDLCALC, TRIG, CHOLHDL, LDLDIRECT in the last 72 hours. ------------------------------------------------------------------------------------------------------------------ No results for input(s): TSH, T4TOTAL, T3FREE, THYROIDAB in the last 72 hours.  Invalid input(s): FREET3 ------------------------------------------------------------------------------------------------------------------ No results for input(s): VITAMINB12, FOLATE, FERRITIN, TIBC, IRON, RETICCTPCT in the last 72 hours.  Coagulation profile No results for input(s): INR, PROTIME in the last 168 hours.  No results for input(s): DDIMER in the last 72 hours.  Cardiac Enzymes  Recent Labs Lab 02/26/2015 0214  TROPONINI 0.05*   ------------------------------------------------------------------------------------------------------------------ Invalid input(s): POCBNP    Assessment & Plan   #1 DKA (diabetic ketoacidoses) -on insulin drip now. Clinically   improving. Possibly can wean off the insulin drip. Continue current diet. We will transition her to sliding scale with coverage.  #2 Dehydration ;improving; #3 septic shock:due to cdiff colitis. /pneumonia;Continue Flagyl./vancomycin, Zosyn. wean off levaphed.    #4 . Acute renal failure with hyponatremia: improving with fluids.hypokalemia;replace . Has ckd stgae 3;appreciate nephro f ollowing, I did speak with Dr. Wynelle Link ,he  will see the patient.  #5recent ribs fracture- when necessary pain control, incentive spirometry try to avoid narcotics which may be playing a role in her lethargy and weakness  #6 Hypothyroidism - continue  Iv  levothyroxine 7.acute respiratory failure due to bilateral pneumonia;continue vanco, zosyn,o2,clinically improving  #8. Severe  Malnutrition;due to  her acute illness started on dysphagia 1 diet by speech,.seen by RD, continue [employer] instant breakfast.  7. CODE STATUS DO NOT RESUSCITATE. Discussed this with patient's daughter did express that patient wants to be DO NOT RESUSCITATE. We will place the order in the chart.     Code Status Orders        Start     Ordered   02/11/2015 0505  Full code   Continuous     02/23/2015 0504           Consults endocrinology   DVT Prophylaxis  heparin  Lab Results  Component Value Date   PLT 259 02/04/2015     Time Spent in minutes  45 minutes of critical care time patient still on insulin drip blood glucose and needs to be monitored closely Bassam Dresch M.D on 02/04/2015 at 10:22 AM  Between 7am to 6pm - Pager - [phone]  After 6pm go to [email or web address] - password [email or web address] [facility]  [facility]   Office  [phone]

## 2015-02-04 NOTE — Progress Notes (Signed)
Pharmacy Consult for Electrolyte Management   No Known Allergies  Patient Measurements: Height:  (154.9 cm) Weight: 119 lb 11.4 oz (54.3 kg) IBW/kg (Calculated) : 47.8  Vital Signs: Temp: 98.4 F (36.9 C) (09/06 0400) Temp Source: Oral (09/06 0100) BP: 102/51 mmHg (09/06 0600) Pulse Rate: 88 (09/06 0600) Intake/Output from previous day: 09/05 0701 - 09/06 0700 In: 2356.4 [I.V.:1306.4; IV Piggyback:1050] Out: 500 [Urine:500] Intake/Output from this shift:    Labs:  Recent Labs  02/03/15 0429 02/04/15 0455  WBC 19.7* 16.7*  HGB 8.3* 9.1*  HCT 24.7* 26.8*  PLT 243 259    Recent Labs  02/02/15 0315 02/03/15 0429 02/04/15 0455  NA 133* 136 134*  K 3.7 3.1* 4.1  CL 113* 115* 112*  CO2 15* 16* 13*  GLUCOSE 206* 104* 217*  BUN CREATININE 1.25* 1.19* 1.22*  CALCIUM 7.6* 7.5* 7.6*  MG 1.9 1.8 1.6*  PHOS 3.2 2.6 2.3*   Estimated Creatinine Clearance: 33.8 mL/min (by C-G formula based on Cr of 1.22).    Recent Labs  02/04/15 0408 02/04/15 0458 02/04/15 0602  GLUCAP 191* 201* 255*    Medical History: Past Medical History  Diagnosis Date  . Diabetes mellitus type 1     (prior saw Dr. Lodema Hong endo, would like to establish locally)  . Generalized headaches     frequent  . Hypothyroidism   . Convulsions/seizures     unknown type, nml EEG  . History of chicken pox   . Anemia   . Vitamin B12 deficiency 03/2012    normal IF    Medications:  Scheduled:  . antiseptic oral rinse  7 mL Mouth Rinse BID  . heparin  5,000 Units Subcutaneous 3 times per day  . levothyroxine  50 mcg Intravenous Daily  . magnesium sulfate 1 - 4 g bolus IVPB  2 g Intravenous Once  . methylPREDNISolone (SOLU-MEDROL) injection  60 mg Intravenous Q24H  . metronidazole  500 mg Intravenous Q8H  . ofloxacin  1 drop Both Eyes QID  . piperacillin-tazobactam (ZOSYN)  IV  3.375 g Intravenous 3 times per day  . sodium chloride  3 mL Intravenous Q12H  . sodium phosphate   Dextrose 5% IVPB  10 mmol Intravenous Once  . vancomycin  750 mg Intravenous Q24H   Infusions:  . dextrose 5 % and 0.9% NaCl 50 mL/hr at 02/03/15 0700  . insulin (NOVOLIN-R) infusion 3.4 Units/hr (02/04/15 0703)  . norepinephrine 2 mcg/min (02/04/15 0150)   PRN: acetaminophen **OR** acetaminophen, ipratropium-albuterol, ondansetron **OR** ondansetron (ZOFRAN) IV, sodium chloride, sodium chloride  Assessment: Phamracy consulted to assist in managing electrolytes in this 69 y/o F with DKA.   Plan:  Phosphorus and magnesium are low so will replace with magnesium sulfate 2 g iv once and sodium phosphate 10 mmol iv once. Will recheck electrolytes with AM labs.  Pharmacy to follow per consult.   Valentina Gu, [facility] Clinical Pharmacist   02/04/2015,7:07 AM

## 2015-02-04 NOTE — Progress Notes (Addendum)
ENDOCRINOLOGY Follow up  REFERRING PHYSICIAN: Auburn Bilberry, MD CONSULTING PHYSICIAN:  Doylene Canning, MD  CHIEF COMPLAINT: Type 1 DM on insulin pump  HPI: 69 y.o. female seen in consultation for diabetes mellitus, type 1 on insulin pump therapy admitted with septic shock and DKA.   24  Hr events:  Her mental status has improved. She has started to take food by mouth. Reports mild nausea but no vomiting or abdominal pain. She does have C diff colitis and associated diarrhea, flagyl started over the weekend. She has continued on continuous insulin infusion. The levophed gtt was weaned off this am. She has been on stress dose steroids as well. No hypoglycemia.   CURRENT MEDICATIONS:  . antiseptic oral rinse  7 mL Mouth Rinse BID  . heparin  5,000 Units Subcutaneous 3 times per day  . levothyroxine  50 mcg Intravenous Daily  . methylPREDNISolone (SOLU-MEDROL) injection  60 mg Intravenous Q24H  . metronidazole  500 mg Intravenous Q8H  . ofloxacin  1 drop Both Eyes QID  . piperacillin-tazobactam (ZOSYN)  IV  3.375 g Intravenous 3 times per day  . sodium bicarbonate  650 mg Oral TID  . sodium chloride  3 mL Intravenous Q12H  . sodium phosphate  Dextrose 5% IVPB  10 mmol Intravenous Once  . vancomycin  750 mg Intravenous Q24H    ALLERGIES:  No Known Allergies   PHYSICAL EXAMINATION:  BP 102/51 mmHg  Pulse 88  Temp(Src) 97.9 F (36.6 C) (Oral)  Resp 20  Ht 5\' 1"  (1.549 m)  Wt 54.3 kg (119 lb 11.4 oz)  BMI 22.63 kg/m2  SpO2 98%  GENERAL: awake, alert, in NAD. HEENT:  Dry mucus membranes CARDIAC:  Regular rate and rhythm  PULMONARY:  Clear to auscultation anteriorly.  ABDOMEN:  Diffusely soft, nontender, nondistended, normoactive bowel sounds.  EXTREMITIES:  No peripheral edema is present, 2+ pedal pulses bilaterally, no ulcerations.    SKIN:  Warm, dry.  LABORATORY DATA:  01/17/15 -- Hgb A1c 10.7%  Results for orders placed or performed during the hospital encounter of  February 16, 2015 (from the past 24 hour(s))  Glucose, capillary     Status: Abnormal   Collection Time: 02/03/15 11:30 AM  Result Value Ref Range   Glucose-Capillary 235 (H) 65 - 99 mg/dL  Glucose, capillary     Status: Abnormal   Collection Time: 02/03/15 12:02 PM  Result Value Ref Range   Glucose-Capillary 233 (H) 65 - 99 mg/dL  Glucose, capillary     Status: Abnormal   Collection Time: 02/03/15 12:40 PM  Result Value Ref Range   Glucose-Capillary 212 (H) 65 - 99 mg/dL  Glucose, capillary     Status: Abnormal   Collection Time: 02/03/15  1:45 PM  Result Value Ref Range   Glucose-Capillary 175 (H) 65 - 99 mg/dL  Glucose, capillary     Status: Abnormal   Collection Time: 02/03/15  2:50 PM  Result Value Ref Range   Glucose-Capillary 135 (H) 65 - 99 mg/dL  Glucose, capillary     Status: Abnormal   Collection Time: 02/03/15  3:44 PM  Result Value Ref Range   Glucose-Capillary 153 (H) 65 - 99 mg/dL  Glucose, capillary     Status: Abnormal   Collection Time: 02/03/15  5:04 PM  Result Value Ref Range   Glucose-Capillary 139 (H) 65 - 99 mg/dL  Glucose, capillary     Status: Abnormal   Collection Time: 02/03/15  6:12 PM  Result Value Ref Range  Glucose-Capillary 137 (H) 65 - 99 mg/dL  Glucose, capillary     Status: Abnormal   Collection Time: 02/03/15  7:13 PM  Result Value Ref Range   Glucose-Capillary 183 (H) 65 - 99 mg/dL  Glucose, capillary     Status: Abnormal   Collection Time: 02/03/15  8:03 PM  Result Value Ref Range   Glucose-Capillary 209 (H) 65 - 99 mg/dL  Glucose, capillary     Status: Abnormal   Collection Time: 02/03/15  9:09 PM  Result Value Ref Range   Glucose-Capillary 193 (H) 65 - 99 mg/dL  Glucose, capillary     Status: Abnormal   Collection Time: 02/03/15 10:19 PM  Result Value Ref Range   Glucose-Capillary 168 (H) 65 - 99 mg/dL  Glucose, capillary     Status: Abnormal   Collection Time: 02/03/15 11:23 PM  Result Value Ref Range   Glucose-Capillary 108 (H) 65  - 99 mg/dL  Glucose, capillary     Status: None   Collection Time: 02/04/15 12:30 AM  Result Value Ref Range   Glucose-Capillary 92 65 - 99 mg/dL  Glucose, capillary     Status: Abnormal   Collection Time: 02/04/15  1:22 AM  Result Value Ref Range   Glucose-Capillary 103 (H) 65 - 99 mg/dL  Glucose, capillary     Status: Abnormal   Collection Time: 02/04/15  2:04 AM  Result Value Ref Range   Glucose-Capillary 122 (H) 65 - 99 mg/dL  Glucose, capillary     Status: Abnormal   Collection Time: 02/04/15  3:02 AM  Result Value Ref Range   Glucose-Capillary 168 (H) 65 - 99 mg/dL  Glucose, capillary     Status: Abnormal   Collection Time: 02/04/15  4:08 AM  Result Value Ref Range   Glucose-Capillary 191 (H) 65 - 99 mg/dL  Magnesium     Status: Abnormal   Collection Time: 02/04/15  4:55 AM  Result Value Ref Range   Magnesium 1.6 (L) 1.7 - 2.4 mg/dL  Phosphorus     Status: Abnormal   Collection Time: 02/04/15  4:55 AM  Result Value Ref Range   Phosphorus 2.3 (L) 2.5 - 4.6 mg/dL  CBC     Status: Abnormal   Collection Time: 02/04/15  4:55 AM  Result Value Ref Range   WBC 16.7 (H) 3.6 - 11.0 K/uL   RBC 3.18 (L) 3.80 - 5.20 MIL/uL   Hemoglobin 9.1 (L) 12.0 - 16.0 g/dL   HCT 95.6 (L) 21.3 - 08.6 %   MCV 84.0 80.0 - 100.0 fL   MCH 28.5 26.0 - 34.0 pg   MCHC 34.0 32.0 - 36.0 g/dL   RDW 57.8 46.9 - 62.9 %   Platelets 259 150 - 440 K/uL  Basic metabolic panel     Status: Abnormal   Collection Time: 02/04/15  4:55 AM  Result Value Ref Range   Sodium 134 (L) 135 - 145 mmol/L   Potassium 4.1 3.5 - 5.1 mmol/L   Chloride 112 (H) 101 - 111 mmol/L   CO2 13 (L) 22 - 32 mmol/L   Glucose, Bld 217 (H) 65 - 99 mg/dL   BUN 15 6 - 20 mg/dL   Creatinine, Ser 5.28 (H) 0.44 - 1.00 mg/dL   Calcium 7.6 (L) 8.9 - 10.3 mg/dL   GFR calc non Af Amer 45 (L) >60 mL/min   GFR calc Af Amer 52 (L) >60 mL/min   Anion gap 9 5 - 15  Glucose, capillary  Status: Abnormal   Collection Time: 02/04/15  4:58 AM   Result Value Ref Range   Glucose-Capillary 201 (H) 65 - 99 mg/dL  Glucose, capillary     Status: Abnormal   Collection Time: 02/04/15  6:02 AM  Result Value Ref Range   Glucose-Capillary 255 (H) 65 - 99 mg/dL  Glucose, capillary     Status: Abnormal   Collection Time: 02/04/15  7:02 AM  Result Value Ref Range   Glucose-Capillary 173 (H) 65 - 99 mg/dL  Glucose, capillary     Status: Abnormal   Collection Time: 02/04/15  8:06 AM  Result Value Ref Range   Glucose-Capillary 120 (H) 65 - 99 mg/dL  Glucose, capillary     Status: None   Collection Time: 02/04/15  9:14 AM  Result Value Ref Range   Glucose-Capillary 85 65 - 99 mg/dL  Glucose, capillary     Status: Abnormal   Collection Time: 02/04/15 10:17 AM  Result Value Ref Range   Glucose-Capillary 119 (H) 65 - 99 mg/dL    ASSESSMENT:  1. DKA - resolved 2. Uncontrolled type 1 diabetes on insulin pump 3. Acute renal failure due to dehydration, improving 4. Septic shock, improving 5. Hypothyroidism, stable   PLAN: 1. It would be appropriate to transition patient back to her insulin pump given her mental status is now at baseline. Advised that she have family bring in her pump and pump supplies.  Alternatively, she could be transitioned to Sub-q insulin Lantus 6 units once daily plus novolog 1 unit for every 15 grams carbohydrate consumed with each meal. Would avoid correction scale insulin for now. 2. Would overlap the insulin IV infusion with subQ insulin for at least one hour before discontinuation of the gtt. Please call once patient has her pump and supplies for assistance with the transition. 3. Continue IV insulin per protocol for now, BG goal 140-180 mg/dL with hourly glucometer checks. 4. Switch from IV levothyroxine 50 mcg daily to oral 100 mcg daily if her swallowing function is now improved.  Will follow along with you.  Thank you for this consult.   Doylene Canning, MD [facility] Endocrinology

## 2015-02-04 NOTE — Care Management Important Message (Signed)
Important Message  Patient Details  Name: [name] MRN: [id number] Date of Birth: August 18, 1945   Medicare Important Message Given:  Yes-fourth notification given    Marily Memos, RN 02/04/2015, 11:01 AM

## 2015-02-04 NOTE — Consult Note (Signed)
[facility]  CONSULT NOTE    Date: 02/04/2015                  Patient Name:  [name]  MRN: [id number]  DOB: [birth year]  Age / Sex: 69 y.o., female         PCP: Kirk Ruths., MD                 Service Requesting Consult: Dr. Vianne Bulls                 Reason for Consult: Metabolic Acidosis            History of Present Illness: [name] is a 69 y.o. white  female with insulin dependent diabetes mellitus, hypertension, hyperlipidemia, hypothyroidism, seizure disorder, anemia  who was admitted to [facility] on 02/06/2015 for DKA and hospitalization complicated by c. Diff colitis.  Patient is currently on insulin gtt, norepinephrine gtt, and IV fluids of D5NS at 50. However continues to have anion gap acidosis and nephrology has been consulted.  On admission, creatinine of 1.99, this has trended to 1.22 today with eGFR of 45. Hemoglobin and wbc trending down. Currently on pip/tazo, vanco and metronidazole.    Medications: Outpatient medications: Prescriptions prior to admission  Medication Sig Dispense Refill Last Dose  . Calcium Carbonate-Vit D-Min (CALCIUM/VITAMIN D/MINERALS) 600-200 MG-UNIT TABS Take 1 tablet by mouth daily.   unknown at unknown  . cyanocobalamin 100 MCG tablet Take 100 mcg by mouth daily.   unknown at unknown  . nystatin (MYCOSTATIN) 100000 UNIT/ML suspension Take 10 mLs by mouth 4 (four) times daily.   unknown at unknown  . ondansetron (ZOFRAN) 4 MG tablet Take 8 mg by mouth every 8 (eight) hours as needed for nausea or vomiting.   unknown at unknown  . tuberculin 5 UNIT/0.1ML injection Inject 5 Units into the skin every 14 (fourteen) days.   unknown at unknown  . Blood Glucose Monitoring Suppl (ONE TOUCH ULTRA 2) W/DEVICE KIT Every 4 hours   unknown at unknown  . insulin lispro (HUMALOG) 100 UNIT/ML injection Inject 3 Units into the skin every 4 (four) hours.    unknown at unknown  . levothyroxine (SYNTHROID,  LEVOTHROID) 100 MCG tablet Take 100 mcg by mouth daily before breakfast.  3 unknown at unknown  . loratadine (CLARITIN) 10 MG tablet Take 10 mg by mouth daily.   unknown at unknown  . traMADol (ULTRAM) 50 MG tablet Take 1 tablet (50 mg total) by mouth every 12 (twelve) hours as needed for moderate pain. 30 tablet 0     Current medications: Current Facility-Administered Medications  Medication Dose Route Frequency Provider Last Rate Last Dose  . acetaminophen (TYLENOL) tablet 650 mg  650 mg Oral Q6H PRN Lance Coon, MD       Or  . acetaminophen (TYLENOL) suppository 650 mg  650 mg Rectal Q6H PRN Lance Coon, MD      . antiseptic oral rinse (CPC / CETYLPYRIDINIUM CHLORIDE 0.05%) solution 7 mL  7 mL Mouth Rinse BID Lance Coon, MD   7 mL at 02/04/15 1000  . dextrose 5 %-0.9 % sodium chloride infusion   Intravenous Continuous Epifanio Lesches, MD 50 mL/hr at 02/04/15 0831    . heparin injection 5,000 Units  5,000 Units Subcutaneous 3 times per day Lance Coon, MD   5,000 Units at 02/04/15 0606  . insulin regular (NOVOLIN R,HUMULIN R) 250 Units in sodium chloride 0.9 % 250 mL (  1 Units/mL) infusion   Intravenous Continuous Lance Coon, MD 0.3 mL/hr at 02/04/15 0915 0.3 Units/hr at 02/04/15 0915  . ipratropium-albuterol (DUONEB) 0.5-2.5 (3) MG/3ML nebulizer solution 3 mL  3 mL Nebulization Q4H PRN Epifanio Lesches, MD      . levothyroxine (SYNTHROID, LEVOTHROID) injection 50 mcg  50 mcg Intravenous Daily Abby Percell Locus, MD   50 mcg at 02/03/15 1053  . methylPREDNISolone sodium succinate (SOLU-MEDROL) 125 mg/2 mL injection 60 mg  60 mg Intravenous Q24H Epifanio Lesches, MD   60 mg at 02/03/15 1053  . metroNIDAZOLE (FLAGYL) IVPB 500 mg  500 mg Intravenous Q8H Epifanio Lesches, MD   500 mg at 02/04/15 0916  . norepinephrine (LEVOPHED) 21m in D5W 2582mpremix infusion  0-40 mcg/min Intravenous Titrated DaLance CoonMD 7.5 mL/hr at 02/04/15 0150 2 mcg/min at 02/04/15 0150  . ofloxacin  (OCUFLOX) 0.3 % ophthalmic solution 1 drop  1 drop Both Eyes QID ShDustin FlockMD   1 drop at 02/04/15 0916  . ondansetron (ZOFRAN) tablet 4 mg  4 mg Oral Q6H PRN DaLance CoonMD       Or  . ond[facility]etron ([facility] mg  4 mg Intravenous Q6H PRN DaLance CoonMD   4 mg at 01/31/15 2104  . piperacillin-tazobactam (ZOSYN) IVPB 3.375 g  3.375 g Intravenous 3 times per day DaLance CoonMD   3.375 g at 02/04/15 0607  . sodium chloride 0.9 % bolus 1,000 mL  1,000 mL Intravenous PRN DaLance CoonMD   1,000 mL at 02/06/2015 2145  . sodium chloride 0.9 % bolus 1,000 mL  1,000 mL Intravenous PRN DaLance CoonMD   1,000 mL at 02/18/2015 2300  . sodium chloride 0.9 % injection 3 mL  3 mL Intravenous Q12H DaLance CoonMD   3 mL at 02/03/15 2232  . sodium phosphate 10 mmol in dextrose 5 % 250 mL infusion  10 mmol Intravenous Once SnEpifanio LeschesMD   10 mmol at 02/04/15 0810  . vancomycin (VANCOCIN) IVPB 750 mg/150 ml premix  750 mg Intravenous Q24H ShDustin FlockMD   750 mg at 02/04/15 0501      Allergies: No Known Allergies    Past Medical History: Past Medical History  Diagnosis Date  . Diabetes mellitus type 1     (prior saw Dr. SiMoshe Ciprondo, would like to establish locally)  . Generalized headaches     frequent  . Hypothyroidism   . Convulsions/seizures     unknown type, nml EEG  . History of chicken pox   . Anemia   . Vitamin B12 deficiency 03/2012    normal IF     Past Surgical History: Past Surgical History  Procedure Laterality Date  . Cesarean section  1919   Family History: Family History  Problem Relation Age of Onset  . Stroke Mother   . Hypertension Mother   . Hypertension Father   . Coronary artery disease Father [id number]  MI  . Hypertension Sister   . Diabetes Cousin     type 1  . Cancer Maternal Aunt     brain  . Hyperlipidemia Brother      Social History: Social History   Social History  . Marital Status: Significant Other    Spouse  Name: N/A  . Number of Children: N/A  . Years of Education: N/A   Occupational History  . Not on file.   Social History Main Topics  . Smoking status:  Never Smoker   . Smokeless tobacco: Never Used  . Alcohol Use: 0.6 oz/week    1 Glasses of wine, 0 Cans of beer, 0 Shots of liquor per week     Comment: Socially (on weekends)  . Drug Use: No  . Sexual Activity: No   Other Topics Concern  . Not on file   Social History Narrative   Lives with daughter, 2 dogs   Occupation: retired, was Radiographer, therapeutic   Activity: no regular activity   Diet: good water, fruits/vegetables daily     Review of Systems: Review of Systems  Constitutional: Positive for weight loss, malaise/fatigue and diaphoresis. Negative for fever and chills.  HENT: Negative for congestion, ear discharge, ear pain, hearing loss, nosebleeds, sore throat and tinnitus.   Eyes: Negative.  Negative for blurred vision, double vision, photophobia, pain, discharge and redness.  Respiratory: Negative.  Negative for cough, hemoptysis, sputum production, shortness of breath, wheezing and stridor.   Cardiovascular: Negative for chest pain, palpitations, orthopnea, claudication, leg swelling and PND.  Gastrointestinal: Positive for nausea, vomiting and diarrhea. Negative for heartburn, abdominal pain, constipation, blood in stool and melena.  Genitourinary: Negative.  Negative for dysuria, urgency, frequency, hematuria and flank pain.  Musculoskeletal: Positive for falls and neck pain. Negative for myalgias, back pain and joint pain.  Skin: Negative for itching and rash.  Neurological: Positive for dizziness, seizures, loss of consciousness, weakness and headaches. Negative for tingling, tremors, sensory change, speech change and focal weakness.  Endo/Heme/Allergies: Negative.   Psychiatric/Behavioral: Negative.     Vital Signs: Blood pressure 102/51, pulse 88, temperature 97.9 F (36.6 C), temperature source Oral, resp.  rate 20, height '5\' 1"'  (1.549 m), weight 54.3 kg (119 lb 11.4 oz), SpO2 98 %.  Weight trends: Filed Weights   02/08/2015 0210 02/14/2015 0500  Weight: 53.071 kg (117 lb) 54.3 kg (119 lb 11.4 oz)    Physical Exam: General: Critically ill appearing  Head: Normocephalic, atraumatic. Dry mucosal membranes  Eyes: Anicteric, PERRL  Neck: Right IJ central line  Lungs:  Clear to auscultation  Heart: Regular rate and rhythm  Abdomen:  Soft, nontender  Extremities: no peripheral edema.  Neurologic: Nonfocal, moving all four extremities  Skin: No lesions        Lab results: Basic Metabolic Panel:  Recent Labs Lab 01/31/15 0520 02/01/15 0508 02/02/15 0315 02/03/15 0429 02/04/15 0455  NA 128* 127* 133* 136 134*  K 3.4* 3.7 3.7 3.1* 4.1  CL 108 109 113* 115* 112*  CO2 15* 13* 15* 16* 13*  GLUCOSE 166* 234* 206* 104* 217*  BUN '9 8 9 11 15  ' CREATININE 1.49* 1.47* 1.25* 1.19* 1.22*  CALCIUM 6.8* 6.8* 7.6* 7.5* 7.6*  MG 1.6* 2.2 1.9 1.8 1.6*  PHOS 2.7 2.2* 3.2 2.6 2.3*    Liver Function Tests:  Recent Labs Lab 02/06/2015 0214  AST 31  ALT 13*  ALKPHOS 155*  BILITOT 1.2  PROT 6.8  ALBUMIN 3.1*   No results for input(s): LIPASE, AMYLASE in the last 168 hours. No results for input(s): AMMONIA in the last 168 hours.  CBC:  Recent Labs Lab 02/08/2015 0214 01/31/15 0520 02/01/15 0508 02/03/15 0429 02/04/15 0455  WBC 13.1* 21.0* 16.1* 19.7* 16.7*  NEUTROABS 10.0*  --   --   --   --   HGB 11.2* 9.5* 8.6* 8.3* 9.1*  HCT 34.5* 29.1* 25.9* 24.7* 26.8*  MCV 86.1 84.0 84.6 82.6 84.0  PLT 356 260 227 243 259  Cardiac Enzymes:  Recent Labs Lab 02/20/2015 0214  TROPONINI 0.05*    BNP: Invalid input(s): POCBNP  CBG:  Recent Labs Lab 02/04/15 0458 02/04/15 0602 02/04/15 0702 02/04/15 0806 02/04/15 0914  GLUCAP 201* 255* 173* 120* 85    Microbiology: Results for orders placed or performed during the hospital encounter of 02/01/2015  Culture, blood (routine x 2)      Status: None   Collection Time: 02/04/2015  3:34 AM  Result Value Ref Range Status   Specimen Description BLOOD BLOOD RIGHT FOREARM  Final   Special Requests BOTTLES DRAWN AEROBIC AND ANAEROBIC 5ML  Final   Culture  Setup Time   Final    GRAM POSITIVE RODS IN BOTH AEROBIC AND ANAEROBIC BOTTLES CRITICAL RESULT CALLED TO, READ BACK BY AND VERIFIED WITH: BRITTANY RUDD ON 02/16/2015 AT 1752 BY TB CONFIRMED BY TB/MS.    Culture   Final    BACILLUS SPECIES IN BOTH AEROBIC AND ANAEROBIC BOTTLES Results consistent with contamination.    Report Status 02/03/2015 FINAL  Final  Culture, blood (routine x 2)     Status: None   Collection Time: 02/17/2015  3:34 AM  Result Value Ref Range Status   Specimen Description BLOOD RIGHT HAND  Final   Special Requests BOTTLES DRAWN AEROBIC AND ANAEROBIC 5ML  Final   Culture NO GROWTH 5 DAYS  Final   Report Status 02/04/2015 FINAL  Final  MRSA PCR Screening     Status: None   Collection Time: 02/25/2015  5:15 AM  Result Value Ref Range Status   MRSA by PCR NEGATIVE NEGATIVE Final    Comment:        The GeneXpert MRSA Assay (FDA approved for NASAL specimens only), is one component of a comprehensive MRSA colonization surveillance program. It is not intended to diagnose MRSA infection nor to guide or monitor treatment for MRSA infections.   C difficile quick scan w PCR reflex     Status: Abnormal   Collection Time: 02/02/15  4:12 PM  Result Value Ref Range Status   C Diff antigen POSITIVE (A) NEGATIVE Final   C Diff toxin POSITIVE (A) NEGATIVE Final   C Diff interpretation   Final    Positive for toxigenic C. difficile, active toxin production present.    Comment: CRITICAL RESULT CALLED TO, READ BACK BY AND VERIFIED WITH: CHRISTINA MILES ON 02/02/15 AT 1715 BY [facility]     Coagulation Studies: No results for input(s): LABPROT, INR in the last 72 hours.  Urinalysis: No results for input(s): COLORURINE, LABSPEC, PHURINE, GLUCOSEU, HGBUR, BILIRUBINUR,  KETONESUR, PROTEINUR, UROBILINOGEN, NITRITE, LEUKOCYTESUR in the last 72 hours.  Invalid input(s): APPERANCEUR    Imaging:  No results found.   Assessment & Plan: [name] is a 69 y.o. white  female with insulin dependent diabetes mellitus, hypertension, hyperlipidemia, hypothyroidism, seizure disorder, anemia  who was admitted to [facility] on 02/14/2015 for DKA and hospitalization complicated by c. Diff colitis.   1. Acute Renal Failure on chronic kidney disease stage III: baseline creatinien from 01/22/15 shows a eGFR of 57. However with multiple episodes of acute renal injury. Chronic kidney disease secondary to diabetic nephropathy and hypertension. Hematuria on urinalysis on admission. No proteinuria Acute renal failure from Diabetic ketoacidosis, infective colitis and hypovolemia. Creatinine and urine output improving.  - Continue IV fluids: D5NS - Check renal ultrasound - Unclear why patient is not on an ACE-I/ARB as outpatient. Will need to start prior to discharge  2. Metabolic Acidosis:  with anion gap. Lactic acid at goal. Most likely due to diabetic ketoacidosis compounded with acute renal failure.  - Agree with closing anion gap with insulin and dextrose - Will start oral sodium bicarbonate as well.   3. Anemia: hemoglobin of 9.1. With iron deficiency.  - continue to monitor CBC - recommend iron supplements.      LOS: 5 Marvell Stavola 9/6/201610:15 AM

## 2015-02-05 ENCOUNTER — Inpatient Hospital Stay: Payer: Medicare Other

## 2015-02-05 LAB — GLUCOSE, CAPILLARY
GLUCOSE-CAPILLARY: 102 mg/dL — AB (ref 65–99)
GLUCOSE-CAPILLARY: 107 mg/dL — AB (ref 65–99)
GLUCOSE-CAPILLARY: 110 mg/dL — AB (ref 65–99)
GLUCOSE-CAPILLARY: 115 mg/dL — AB (ref 65–99)
GLUCOSE-CAPILLARY: 115 mg/dL — AB (ref 65–99)
GLUCOSE-CAPILLARY: 130 mg/dL — AB (ref 65–99)
GLUCOSE-CAPILLARY: 144 mg/dL — AB (ref 65–99)
GLUCOSE-CAPILLARY: 180 mg/dL — AB (ref 65–99)
GLUCOSE-CAPILLARY: 185 mg/dL — AB (ref 65–99)
GLUCOSE-CAPILLARY: 202 mg/dL — AB (ref 65–99)
GLUCOSE-CAPILLARY: 217 mg/dL — AB (ref 65–99)
Glucose-Capillary: 154 mg/dL — ABNORMAL HIGH (ref 65–99)
Glucose-Capillary: 125 mg/dL — ABNORMAL HIGH (ref 65–99)
Glucose-Capillary: 169 mg/dL — ABNORMAL HIGH (ref 65–99)
Glucose-Capillary: 214 mg/dL — ABNORMAL HIGH (ref 65–99)
Glucose-Capillary: 218 mg/dL — ABNORMAL HIGH (ref 65–99)
Glucose-Capillary: 206 mg/dL — ABNORMAL HIGH (ref 65–99)
Glucose-Capillary: 167 mg/dL — ABNORMAL HIGH (ref 65–99)
Glucose-Capillary: 116 mg/dL — ABNORMAL HIGH (ref 65–99)
Glucose-Capillary: 139 mg/dL — ABNORMAL HIGH (ref 65–99)
Glucose-Capillary: 153 mg/dL — ABNORMAL HIGH (ref 65–99)
Glucose-Capillary: 192 mg/dL — ABNORMAL HIGH (ref 65–99)
Glucose-Capillary: 210 mg/dL — ABNORMAL HIGH (ref 65–99)
Glucose-Capillary: 114 mg/dL — ABNORMAL HIGH (ref 65–99)

## 2015-02-05 LAB — CBC
HCT: 26.8 % — ABNORMAL LOW (ref 35.0–47.0)
HEMOGLOBIN: 9 g/dL — AB (ref 12.0–16.0)
MCH: 27.8 pg (ref 26.0–34.0)
MCHC: 33.5 g/dL (ref 32.0–36.0)
MCV: 83 fL (ref 80.0–100.0)
PLATELETS: 262 10*3/uL (ref 150–440)
RBC: 3.23 MIL/uL — AB (ref 3.80–5.20)
RDW: 14.5 % (ref 11.5–14.5)
WBC: 18 10*3/uL — ABNORMAL HIGH (ref 3.6–11.0)

## 2015-02-05 LAB — BASIC METABOLIC PANEL
Anion gap: 5 (ref 5–15)
BUN: 15 mg/dL (ref 6–20)
CHLORIDE: 110 mmol/L (ref 101–111)
CO2: 18 mmol/L — ABNORMAL LOW (ref 22–32)
CREATININE: 1.05 mg/dL — AB (ref 0.44–1.00)
Calcium: 7.5 mg/dL — ABNORMAL LOW (ref 8.9–10.3)
GFR, EST NON AFRICAN AMERICAN: 54 mL/min — AB (ref >60)
Glucose, Bld: 145 mg/dL — ABNORMAL HIGH (ref 65–99)
POTASSIUM: 3.3 mmol/L — AB (ref 3.5–5.1)
SODIUM: 133 mmol/L — AB (ref 135–145)

## 2015-02-05 LAB — PHOSPHORUS: PHOSPHORUS: 2.6 mg/dL (ref 2.5–4.6)

## 2015-02-05 LAB — MAGNESIUM: MAGNESIUM: 1.8 mg/dL (ref 1.7–2.4)

## 2015-02-05 MED ORDER — LEVOTHYROXINE SODIUM 100 MCG PO TABS
100.0000 ug | ORAL_TABLET | Freq: Every day | ORAL | Status: DC
  Administered 2015-02-07 – 2015-02-09 (×3): 100 ug via ORAL
  Filled 2015-02-05 (×4): qty 1

## 2015-02-05 MED ORDER — POTASSIUM CHLORIDE 10 MEQ/100ML IV SOLN
10.0000 meq | INTRAVENOUS | Status: AC
  Administered 2015-02-05 (×2): 10 meq via INTRAVENOUS
  Filled 2015-02-05 (×2): qty 100

## 2015-02-05 MED ORDER — SODIUM CHLORIDE 0.9 % IV SOLN
1.0000 g | Freq: Once | INTRAVENOUS | Status: AC
  Administered 2015-02-05: 1 g via INTRAVENOUS
  Filled 2015-02-05: qty 10

## 2015-02-05 MED ORDER — INSULIN PUMP: Freq: Three times a day (TID) | SUBCUTANEOUS | Status: DC

## 2015-02-05 NOTE — Progress Notes (Signed)
ENDOCRINOLOGY Follow up  CHIEF COMPLAINT: Type 1 DM on insulin pump  HPI: 69 y.o. female seen in consultation for diabetes mellitus, type 1 on insulin pump therapy admitted with septic shock and DKA.   24  Hr events:  Her mental status is at baseline. She is tolerating a modified diet. Denies nausea, vomiting, or abdominal pain. Awaiting her family to bring in insulin pump and supplies. She continues on IV insulin infusion per protocol but has had steroid-induced hyperglycemia.   CURRENT MEDICATIONS:  . antiseptic oral rinse  7 mL Mouth Rinse BID  . enoxaparin (LOVENOX) injection  40 mg Subcutaneous Q24H  . levothyroxine  50 mcg Intravenous Daily  . metronidazole  500 mg Intravenous Q8H  . ofloxacin  1 drop Both Eyes QID  . piperacillin-tazobactam (ZOSYN)  IV  3.375 g Intravenous 3 times per day  . predniSONE  40 mg Oral Q breakfast  . sodium bicarbonate  650 mg Oral TID  . sodium chloride  3 mL Intravenous Q12H  . vancomycin  750 mg Intravenous Q24H    ALLERGIES:  No Known Allergies   PHYSICAL EXAMINATION:  BP 91/49 mmHg  Pulse 81  Temp(Src) 97.7 F (36.5 C) (Oral)  Resp 20  Ht  (1.549 m)  Wt 54.3 kg (119 lb 11.4 oz)  BMI 22.63 kg/m2  SpO2 99%  GENERAL: awake, alert, in NAD. CARDIAC:  Regular rate and rhythm  PULMONARY:  Clear to auscultation anteriorly.  ABDOMEN:  Diffusely soft, nontender, nondistended, normoactive bowel sounds.  EXTREMITIES:  No peripheral edema is present.    SKIN:  Warm, dry.   LABORATORY DATA:  01/17/15 -- Hgb A1c 10.7%  Results for orders placed or performed during the hospital encounter of 02/05/2015 (from the past 24 hour(s))  Glucose, capillary     Status: Abnormal   Collection Time: 02/04/15 11:20 AM  Result Value Ref Range   Glucose-Capillary 154 (H) 65 - 99 mg/dL  Glucose, capillary     Status: Abnormal   Collection Time: 02/04/15 12:25 PM  Result Value Ref Range   Glucose-Capillary 207 (H) 65 - 99 mg/dL  Glucose, capillary      Status: Abnormal   Collection Time: 02/04/15  1:29 PM  Result Value Ref Range   Glucose-Capillary 218 (H) 65 - 99 mg/dL  Glucose, capillary     Status: Abnormal   Collection Time: 02/04/15  2:44 PM  Result Value Ref Range   Glucose-Capillary 232 (H) 65 - 99 mg/dL  Glucose, capillary     Status: Abnormal   Collection Time: 02/04/15  3:49 PM  Result Value Ref Range   Glucose-Capillary 203 (H) 65 - 99 mg/dL  Glucose, capillary     Status: Abnormal   Collection Time: 02/04/15  4:52 PM  Result Value Ref Range   Glucose-Capillary 151 (H) 65 - 99 mg/dL  Glucose, capillary     Status: Abnormal   Collection Time: 02/04/15  5:56 PM  Result Value Ref Range   Glucose-Capillary 109 (H) 65 - 99 mg/dL  Glucose, capillary     Status: None   Collection Time: 02/04/15  6:58 PM  Result Value Ref Range   Glucose-Capillary 69 65 - 99 mg/dL  Glucose, capillary     Status: Abnormal   Collection Time: 02/04/15  7:18 PM  Result Value Ref Range   Glucose-Capillary 101 (H) 65 - 99 mg/dL  Glucose, capillary     Status: None   Collection Time: 02/04/15  8:20 PM  Result  Value Ref Range   Glucose-Capillary 99 65 - 99 mg/dL  Glucose, capillary     Status: Abnormal   Collection Time: 02/04/15  9:10 PM  Result Value Ref Range   Glucose-Capillary 145 (H) 65 - 99 mg/dL  Glucose, capillary     Status: Abnormal   Collection Time: 02/04/15 10:15 PM  Result Value Ref Range   Glucose-Capillary 211 (H) 65 - 99 mg/dL  Glucose, capillary     Status: Abnormal   Collection Time: 02/04/15 11:22 PM  Result Value Ref Range   Glucose-Capillary 217 (H) 65 - 99 mg/dL  Glucose, capillary     Status: Abnormal   Collection Time: 02/05/15 12:13 AM  Result Value Ref Range   Glucose-Capillary 217 (H) 65 - 99 mg/dL  Glucose, capillary     Status: Abnormal   Collection Time: 02/05/15  1:17 AM  Result Value Ref Range   Glucose-Capillary 180 (H) 65 - 99 mg/dL  Glucose, capillary     Status: Abnormal   Collection Time:  02/05/15  2:06 AM  Result Value Ref Range   Glucose-Capillary 154 (H) 65 - 99 mg/dL  Glucose, capillary     Status: Abnormal   Collection Time: 02/05/15  3:08 AM  Result Value Ref Range   Glucose-Capillary 115 (H) 65 - 99 mg/dL  Glucose, capillary     Status: Abnormal   Collection Time: 02/05/15  4:05 AM  Result Value Ref Range   Glucose-Capillary 102 (H) 65 - 99 mg/dL  Glucose, capillary     Status: Abnormal   Collection Time: 02/05/15  5:08 AM  Result Value Ref Range   Glucose-Capillary 125 (H) 65 - 99 mg/dL  Magnesium     Status: None   Collection Time: 02/05/15  5:14 AM  Result Value Ref Range   Magnesium 1.8 1.7 - 2.4 mg/dL  Phosphorus     Status: None   Collection Time: 02/05/15  5:14 AM  Result Value Ref Range   Phosphorus 2.6 2.5 - 4.6 mg/dL  CBC     Status: Abnormal   Collection Time: 02/05/15  5:14 AM  Result Value Ref Range   WBC 18.0 (H) 3.6 - 11.0 K/uL   RBC 3.23 (L) 3.80 - 5.20 MIL/uL   Hemoglobin 9.0 (L) 12.0 - 16.0 g/dL   HCT 16.1 (L) 09.6 - 04.5 %   MCV 83.0 80.0 - 100.0 fL   MCH 27.8 26.0 - 34.0 pg   MCHC 33.5 32.0 - 36.0 g/dL   RDW 40.9 81.1 - 91.4 %   Platelets 262 150 - 440 K/uL  Basic metabolic panel     Status: Abnormal   Collection Time: 02/05/15  5:14 AM  Result Value Ref Range   Sodium 133 (L) 135 - 145 mmol/L   Potassium 3.3 (L) 3.5 - 5.1 mmol/L   Chloride 110 101 - 111 mmol/L   CO2 18 (L) 22 - 32 mmol/L   Glucose, Bld 145 (H) 65 - 99 mg/dL   BUN 15 6 - 20 mg/dL   Creatinine, Ser 7.82 (H) 0.44 - 1.00 mg/dL   Calcium 7.5 (L) 8.9 - 10.3 mg/dL   GFR calc non Af Amer 54 (L) >60 mL/min   GFR calc Af Amer >60 >60 mL/min   Anion gap 5 5 - 15  Glucose, capillary     Status: Abnormal   Collection Time: 02/05/15  6:12 AM  Result Value Ref Range   Glucose-Capillary 169 (H) 65 - 99 mg/dL  Glucose, capillary  Status: Abnormal   Collection Time: 02/05/15  7:19 AM  Result Value Ref Range   Glucose-Capillary 214 (H) 65 - 99 mg/dL  Glucose,  capillary     Status: Abnormal   Collection Time: 02/05/15  8:04 AM  Result Value Ref Range   Glucose-Capillary 218 (H) 65 - 99 mg/dL  Glucose, capillary     Status: Abnormal   Collection Time: 02/05/15  9:29 AM  Result Value Ref Range   Glucose-Capillary 206 (H) 65 - 99 mg/dL  Glucose, capillary     Status: Abnormal   Collection Time: 02/05/15 10:15 AM  Result Value Ref Range   Glucose-Capillary 167 (H) 65 - 99 mg/dL    ASSESSMENT:  1. DKA - resolved 2. Steroid-induced hyperglycemia 3. Acute renal failure due to dehydration, improving 4. Septic shock, improving 5. Hypothyroidism, stable   PLAN: 1. Recommend discontinuation of steroids as soon as possible. Glycemic control will be difficult with ongoing steroid administration. A slow taper is not necessary. 2. Will transition patient back to her insulin pump once the family brings in her pump and pump supplies.  3. Would overlap the insulin IV infusion with subQ insulin for at least one hour before discontinuation of the gtt. Please call once patient has her pump and supplies for assistance with the transition. 4. Continue IV insulin per protocol for now, BG goal 140-180 mg/dL with hourly glucometer checks. 5. Switch from IV levothyroxine 50 mcg daily to oral 100 mcg daily.  Will follow along with you.  Thank you for this consult.   Doylene Canning, MD [facility] Endocrinology

## 2015-02-05 NOTE — Progress Notes (Signed)
Endocrinology note:  Attempted to transition patient back to her insulin pump at the bedside but was unable to. Patient's family brought the pump and supplies but no insulin. Called pharmacy to request a vial of novolog but could not be dispensed. Patient called her daughter who will bring the insulin later on tonight.   Continue the insulin gtt per protocol overnight. Will transition to her insulin pump in the am.   Doylene Canning, MD [facility] Endocrinology

## 2015-02-05 NOTE — Progress Notes (Signed)
[facility]  ROUNDING NOTE   Subjective:   Sitting up in bed. States her nausea has improved.  Serum bicarbonate improved to 18.   Objective:  Vital signs in last 24 hours:  Temp:  [97.3 F (36.3 C)-98 F (36.7 C)] 97.7 F (36.5 C) (09/07 0800) Pulse Rate:  [76-95] 81 (09/07 0600) Resp:  [14-28] 20 (09/07 0600) BP: (73-118)/(43-76) 91/49 mmHg (09/07 0600) SpO2:  [97 %-100 %] 99 % (09/07 0600)  Weight change:  Filed Weights   02/01/2015 0210 02/08/2015 0500  Weight: 53.071 kg (117 lb) 54.3 kg (119 lb 11.4 oz)    Intake/Output: I/O last 3 completed shifts: In: 8675 [P.O.:120; I.V.:2106.7; IV Piggyback:1403.3] Out: 450 [Urine:450]   Intake/Output this shift:     Physical Exam: General: NAD  Head: Normocephalic, atraumatic. Moist oral mucosal membranes  Eyes: Anicteric, PERRL  Neck: Supple, trachea midline  Lungs:  Clear to auscultation  Heart: Regular rate and rhythm  Abdomen:  Soft, nontender,   Extremities: No peripheral edema.  Neurologic: Nonfocal, moving all four extremities  Skin: No lesions       Basic Metabolic Panel:  Recent Labs Lab 02/01/15 0508 02/02/15 0315 02/03/15 0429 02/04/15 0455 02/05/15 0514  NA 127* 133* 136 134* 133*  K 3.7 3.7 3.1* 4.1 3.3*  CL 109 113* 115* 112* 110  CO2 13* 15* 16* 13* 18*  GLUCOSE 234* 206* 104* 217* 145*  BUN '8 9 11 15 15  ' CREATININE 1.47* 1.25* 1.19* 1.22* 1.05*  CALCIUM 6.8* 7.6* 7.5* 7.6* 7.5*  MG 2.2 1.9 1.8 1.6* 1.8  PHOS 2.2* 3.2 2.6 2.3* 2.6    Liver Function Tests:  Recent Labs Lab 02/05/2015 0214  AST 31  ALT 13*  ALKPHOS 155*  BILITOT 1.2  PROT 6.8  ALBUMIN 3.1*   No results for input(s): LIPASE, AMYLASE in the last 168 hours. No results for input(s): AMMONIA in the last 168 hours.  CBC:  Recent Labs Lab 02/17/2015 0214 01/31/15 0520 02/01/15 0508 02/03/15 0429 02/04/15 0455 02/05/15 0514  WBC 13.1* 21.0* 16.1* 19.7* 16.7* 18.0*  NEUTROABS 10.0*  --   --   --   --    --   HGB 11.2* 9.5* 8.6* 8.3* 9.1* 9.0*  HCT 34.5* 29.1* 25.9* 24.7* 26.8* 26.8*  MCV 86.1 84.0 84.6 82.6 84.0 83.0  PLT 356 260 227 243 259 262    Cardiac Enzymes:  Recent Labs Lab 02/02/2015 0214  TROPONINI 0.05*    BNP: Invalid input(s): POCBNP  CBG:  Recent Labs Lab 02/05/15 0405 02/05/15 0508 02/05/15 0612 02/05/15 0719 02/05/15 0804  GLUCAP 102* 125* 169* 214* 218*    Microbiology: Results for orders placed or performed during the hospital encounter of 02/19/2015  Culture, blood (routine x 2)     Status: None   Collection Time: 02/02/2015  3:34 AM  Result Value Ref Range Status   Specimen Description BLOOD BLOOD RIGHT FOREARM  Final   Special Requests BOTTLES DRAWN AEROBIC AND ANAEROBIC 5ML  Final   Culture  Setup Time   Final    GRAM POSITIVE RODS IN BOTH AEROBIC AND ANAEROBIC BOTTLES CRITICAL RESULT CALLED TO, READ BACK BY AND VERIFIED WITH: BRITTANY RUDD ON 02/07/2015 AT 1752 BY TB CONFIRMED BY TB/MS.    Culture   Final    BACILLUS SPECIES IN BOTH AEROBIC AND ANAEROBIC BOTTLES Results consistent with contamination.    Report Status 02/03/2015 FINAL  Final  Culture, blood (routine x 2)  Status: None   Collection Time: 02/23/2015  3:34 AM  Result Value Ref Range Status   Specimen Description BLOOD RIGHT HAND  Final   Special Requests BOTTLES DRAWN AEROBIC AND ANAEROBIC 5ML  Final   Culture NO GROWTH 5 DAYS  Final   Report Status 02/04/2015 FINAL  Final  MRSA PCR Screening     Status: None   Collection Time: 02/01/2015  5:15 AM  Result Value Ref Range Status   MRSA by PCR NEGATIVE NEGATIVE Final    Comment:        The GeneXpert MRSA Assay (FDA approved for NASAL specimens only), is one component of a comprehensive MRSA colonization surveillance program. It is not intended to diagnose MRSA infection nor to guide or monitor treatment for MRSA infections.   C difficile quick scan w PCR reflex     Status: Abnormal   Collection Time: 02/02/15  4:12 PM   Result Value Ref Range Status   C Diff antigen POSITIVE (A) NEGATIVE Final   C Diff toxin POSITIVE (A) NEGATIVE Final   C Diff interpretation   Final    Positive for toxigenic C. difficile, active toxin production present.    Comment: CRITICAL RESULT CALLED TO, READ BACK BY AND VERIFIED WITH: CHRISTINA MILES ON 02/02/15 AT 1715 BY [facility]     Coagulation Studies: No results for input(s): LABPROT, INR in the last 72 hours.  Urinalysis: No results for input(s): COLORURINE, LABSPEC, PHURINE, GLUCOSEU, HGBUR, BILIRUBINUR, KETONESUR, PROTEINUR, UROBILINOGEN, NITRITE, LEUKOCYTESUR in the last 72 hours.  Invalid input(s): APPERANCEUR    Imaging: No results found.   Medications:   . dextrose 5 % and 0.9% NaCl 50 mL/hr at 02/04/15 0831  . insulin (NOVOLIN-R) infusion 3.1 Units/hr (02/05/15 0832)  . norepinephrine Stopped (02/05/15 0834)   . antiseptic oral rinse  7 mL Mouth Rinse BID  . enoxaparin (LOVENOX) injection  40 mg Subcutaneous Q24H  . levothyroxine  50 mcg Intravenous Daily  . metronidazole  500 mg Intravenous Q8H  . ofloxacin  1 drop Both Eyes QID  . piperacillin-tazobactam (ZOSYN)  IV  3.375 g Intravenous 3 times per day  . potassium chloride  10 mEq Intravenous Q1 Hr x 2  . predniSONE  40 mg Oral Q breakfast  . sodium bicarbonate  650 mg Oral TID  . sodium chloride  3 mL Intravenous Q12H  . vancomycin  750 mg Intravenous Q24H   acetaminophen **OR** acetaminophen, ipratropium-albuterol, ondansetron **OR** ondansetron (ZOFRAN) IV, sodium chloride, sodium chloride  Assessment/ Plan:  Ms. ANACAREN KOHAN is a 69 y.o. white female with insulin dependent diabetes mellitus, hypertension, hyperlipidemia, hypothyroidism, seizure disorder, anemia who was admitted to [facility] on 01/31/2015 for DKA and hospitalization complicated by c. Diff colitis.   1. Acute Renal Failure on chronic kidney disease stage III: baseline creatinine from 01/22/15 shows a eGFR of 57. However with multiple  episodes of acute renal injury.  Today, creatinine improved to 1.05 with eGFR of 54 Chronic kidney disease secondary to diabetic nephropathy and hypertension. Hematuria on urinalysis on admission. No proteinuria Acute renal failure from Diabetic ketoacidosis, infective colitis and hypovolemia. Creatinine and urine output improving.  - Continue IV fluids: D5NS - Pending renal ultrasound - Patient is not on ACE-I/ARB as outpatient. Will need to start prior to discharge  2. Metabolic Acidosis: with anion gap. Lactic acid at goal. Most likely due to diabetic ketoacidosis compounded with acute renal failure and diarrhea. Diarrhea has now improved.  - Agree with closing anion  gap with insulin and dextrose - Continue oral sodium bicarbonate   3. Anemia: hemoglobin of 9. With iron deficiency.  - continue to monitor CBC - recommend iron supplements.   4. 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 Insulin pump scheduled.    LOS: Cabell, Jannette Cotham 9/7/20168:53 AM

## 2015-02-05 NOTE — Progress Notes (Signed)
ANTIBIOTIC CONSULT NOTE - Follow up  Pharmacy Consult for vancomycin/Zosyn Indication: PNA  No Known Allergies  Patient Measurements: Height: 5\' 1"  (154.9 cm) Weight: 119 lb 11.4 oz (54.3 kg) IBW/kg (Calculated) : 47.8 Adjusted Body Weight: 50 kg  Vital Signs: Temp: 97.7 F (36.5 C) (09/07 0800) Temp Source: Oral (09/07 0800) BP: 86/40 mmHg (09/07 1100) Pulse Rate: 85 (09/07 1100) Intake/Output from previous day: 09/06 0701 - 09/07 0700 In: 2533.9 [P.O.:120; I.V.:1510.6; IV Piggyback:903.3] Out: 125 [Urine:125] Intake/Output from this shift: Total I/O In: 500 [I.V.:200; IV Piggyback:300] Out: -   Labs:  Recent Labs  02/03/15 0429 02/04/15 0455 02/05/15 0514  WBC 19.7* 16.7* 18.0*  HGB 8.3* 9.1* 9.0*  PLT 243 259 262  CREATININE 1.19* 1.22* 1.05*   Estimated Creatinine Clearance: 39.2 mL/min (by C-G formula based on Cr of 1.05). No results for input(s): VANCOTROUGH, VANCOPEAK, VANCORANDOM, GENTTROUGH, GENTPEAK, GENTRANDOM, TOBRATROUGH, TOBRAPEAK, TOBRARND, AMIKACINPEAK, AMIKACINTROU, AMIKACIN in the last 72 hours.   Microbiology: Recent Results (from the past 720 hour(s))  MRSA PCR Screening     Status: None   Collection Time: 01/20/15  1:15 PM  Result Value Ref Range Status   MRSA by PCR NEGATIVE NEGATIVE Final    Comment:        The GeneXpert MRSA Assay (FDA approved for NASAL specimens only), is one component of a comprehensive MRSA colonization surveillance program. It is not intended to diagnose MRSA infection nor to guide or monitor treatment for MRSA infections.   Urine culture     Status: None   Collection Time: 01/22/15 12:55 PM  Result Value Ref Range Status   Specimen Description URINE, RANDOM  Final   Special Requests NONE  Final   Culture NO GROWTH 1 DAY  Final   Report Status 01/23/2015 FINAL  Final  Culture, blood (routine x 2)     Status: None   Collection Time: 02/03/2015  3:34 AM  Result Value Ref Range Status   Specimen Description  BLOOD BLOOD RIGHT FOREARM  Final   Special Requests BOTTLES DRAWN AEROBIC AND ANAEROBIC  Final   Culture  Setup Time   Final    GRAM POSITIVE RODS IN BOTH AEROBIC AND ANAEROBIC BOTTLES CRITICAL RESULT CALLED TO, READ BACK BY AND VERIFIED WITH: BRITTANY RUDD ON 02/28/2015 AT 1752 BY TB CONFIRMED BY TB/MS.    Culture   Final    BACILLUS SPECIES IN BOTH AEROBIC AND ANAEROBIC BOTTLES Results consistent with contamination.    Report Status 02/03/2015 FINAL  Final  Culture, blood (routine x 2)     Status: None   Collection Time: 02/09/2015  3:34 AM  Result Value Ref Range Status   Specimen Description BLOOD RIGHT HAND  Final   Special Requests BOTTLES DRAWN AEROBIC AND ANAEROBIC  Final   Culture NO GROWTH 5 DAYS  Final   Report Status 02/04/2015 FINAL  Final  MRSA PCR Screening     Status: None   Collection Time: 02/24/2015  5:15 AM  Result Value Ref Range Status   MRSA by PCR NEGATIVE NEGATIVE Final    Comment:        The GeneXpert MRSA Assay (FDA approved for NASAL specimens only), is one component of a comprehensive MRSA colonization surveillance program. It is not intended to diagnose MRSA infection nor to guide or monitor treatment for MRSA infections.   C difficile quick scan w PCR reflex     Status: Abnormal   Collection Time: 02/02/15  4:12 PM  Result Value Ref Range Status   C Diff antigen POSITIVE (A) NEGATIVE Final   C Diff toxin POSITIVE (A) NEGATIVE Final   C Diff interpretation   Final    Positive for toxigenic C. difficile, active toxin production present.    Comment: CRITICAL RESULT CALLED TO, READ BACK BY AND VERIFIED WITH: CHRISTINA MILES ON 02/02/15 AT 1715 BY [facility]     Medical History: Past Medical History  Diagnosis Date  . Diabetes mellitus type 1     (prior saw Dr. Lodema Hong endo, would like to establish locally)  . Generalized headaches     frequent  . Hypothyroidism   . Convulsions/seizures     unknown type, nml EEG  . History of chicken pox   .  Anemia   . Vitamin B12 deficiency 03/2012    normal IF    Medications:  Infusions:  . dextrose 5 % and 0.9% NaCl 50 mL/hr at 02/04/15 0831  . insulin (NOVOLIN-R) infusion 2.5 Units/hr (02/05/15 1110)  . norepinephrine 2 mcg/min (02/05/15 1110)   Assessment:   Pharmacy consulted to dose vancomycin and Zosyn for 69 yo female on antibiotics for sepsis. Patient is currently ordered vancomycin  IV Q24hr and Zosyn EI 3.375g IV Q8hr.    Goal of Therapy:  Vancomycin trough level 15-20 mcg/ml  Plan:  Vancomycin level therapeutic on 9/4 and SCr remains stable. Will continue current therapy.   Pharmacy will continue to monitor and adjust per consult.  Luisa Hart, PharmD Clinical Pharmacist   02/05/2015,11:43 AM

## 2015-02-05 NOTE — Progress Notes (Signed)
[facility] - [place] at [facility]                                                                                                                                                                                            Patient Demographics   [name], is a 69 y.o. female, DOB - May 27, 1946, ZOX:[id number]  Admit date - 02/21/2015   Admitting Physician Oralia Manis, MD  Outpatient Primary MD for the patient is Lauro Regulus., MD   LOS - 6  Subjective: Patient was recently hospitalized with nausea vomiting and DKA. He was discharged to rehabilitation. Patient at the skilled nursing facility was noted to have hypotension.on insulin drip since admission.seen today, alert and oriented. Patient denies any nausea, vomiting, diarrhea.  Review of Systems:   Limited due to patient being lethargic   Vitals:   Filed Vitals:   02/05/15 0800 02/05/15 0900 02/05/15 1000 02/05/15 1100  BP: 96/52 96/58 105/72 86/40  Pulse: 86 92 96 85  Temp: 97.7 F (36.5 C)     TempSrc: Oral     Resp: 14 27 25 28   Height:      Weight:      SpO2: 98% 97% 98% 91%    Wt Readings from Last 3 Encounters:  02/01/2015 54.3 kg (119 lb 11.4 oz)  01/22/15 56.8 kg (125 lb 3.5 oz)  01/17/15 53.071 kg (117 lb)     Intake/Output Summary (Last 24 hours) at 02/05/15 1203 Last data filed at 02/05/15 1100  Gross per 24 hour  Intake 2250.16 ml  Output      0 ml  Net 2250.16 ml    Physical Exam:   GENERAL lethargic HEAD, EYES, EARS, NOSE AND THROAT: Atraumatic, normocephalic. Extraocular muscles are intact. Pupils equal and reactive to light. Sclerae anicteric. No conjunctival injection. No oro-pharyngeal erythema.  NECK: Supple. There is no jugular venous distention. No bruits, no lymphadenopathy, no thyromegaly.  HEART: Regular rate and rhythm,. No murmurs, no rubs, no clicks.  LUNGS: Bilateral expiratory wheeze in all lung fields. ABDOMEN: Soft, flat, nontender, nondistended.  Has good bowel sounds. No hepatosplenomegaly appreciated.  EXTREMITIES: No evidence of any cyanosis, clubbing, or peripheral edema.  +2 pedal and radial pulses bilaterally.  NEUROLOGIC: Patient is sleepy but able to move all extremities no focal deficit appreciated SKIN: Moist and warm with no rashes appreciated.  Psych: Not anxious, depressed LN: No inguinal LN enlargement    Antibiotics   Anti-infectives    Start     Dose/Rate Route Frequency Ordered Stop   02/02/15 1815  metroNIDAZOLE (FLAGYL) IVPB 500 mg  500 mg 100 mL/hr over 60 Minutes Intravenous Every 8 hours 02/02/15 1804     01/31/15 1600  vancomycin (VANCOCIN) IVPB 750 mg/150 ml premix  Status:  Discontinued     750 mg 150 mL/hr over 60 Minutes Intravenous Every 36 hours 02/04/2015 0516 02/09/2015 1228   01/31/15 0400  vancomycin (VANCOCIN) IVPB 750 mg/150 ml premix  Status:  Discontinued     750 mg 150 mL/hr over 60 Minutes Intravenous Every 24 hours 02/21/2015 1228 02/05/15 1148   02/10/2015 0600  piperacillin-tazobactam (ZOSYN) IVPB 3.375 g  Status:  Discontinued     3.375 g 12.5 mL/hr over 240 Minutes Intravenous 3 times per day 02/11/2015 0516 02/05/15 1148   02/13/2015 0345  vancomycin (VANCOCIN) IVPB 1000 mg/200 mL premix     1,000 mg 200 mL/hr over 60 Minutes Intravenous  Once 02/17/2015 0331 02/17/2015 0506   02/14/2015 0345  piperacillin-tazobactam (ZOSYN) IVPB 3.375 g  Status:  Discontinued     3.375 g 12.5 mL/hr over 240 Minutes Intravenous  Once 02/22/2015 0331 02/05/2015 0745      Medications   Scheduled Meds: . antiseptic oral rinse  7 mL Mouth Rinse BID  . enoxaparin (LOVENOX) injection  40 mg Subcutaneous Q24H  . [START ON 02/06/2015] levothyroxine  100 mcg Oral QAC breakfast  . metronidazole  500 mg Intravenous Q8H  . ofloxacin  1 drop Both Eyes QID  . sodium bicarbonate  650 mg Oral TID  . sodium chloride  3 mL Intravenous Q12H   Continuous Infusions: . dextrose 5 % and 0.9% NaCl 50 mL/hr at 02/04/15 0831  .  insulin (NOVOLIN-R) infusion 2.5 Units/hr (02/05/15 1110)  . norepinephrine 2 mcg/min (02/05/15 1110)   PRN Meds:.acetaminophen **OR** acetaminophen, ipratropium-albuterol, ondansetron **OR** ondansetron (ZOFRAN) IV, sodium chloride, sodium chloride   Data Review:   Micro Results Recent Results (from the past 240 hour(s))  Culture, blood (routine x 2)     Status: None   Collection Time: 02/23/2015  3:34 AM  Result Value Ref Range Status   Specimen Description BLOOD BLOOD RIGHT FOREARM  Final   Special Requests BOTTLES DRAWN AEROBIC AND ANAEROBIC  Final   Culture  Setup Time   Final    GRAM POSITIVE RODS IN BOTH AEROBIC AND ANAEROBIC BOTTLES CRITICAL RESULT CALLED TO, READ BACK BY AND VERIFIED WITH: BRITTANY RUDD ON 02/17/2015 AT 1752 BY TB CONFIRMED BY TB/MS.    Culture   Final    BACILLUS SPECIES IN BOTH AEROBIC AND ANAEROBIC BOTTLES Results consistent with contamination.    Report Status 02/03/2015 FINAL  Final  Culture, blood (routine x 2)     Status: None   Collection Time: 02/03/2015  3:34 AM  Result Value Ref Range Status   Specimen Description BLOOD RIGHT HAND  Final   Special Requests BOTTLES DRAWN AEROBIC AND ANAEROBIC  Final   Culture NO GROWTH 5 DAYS  Final   Report Status 02/04/2015 FINAL  Final  MRSA PCR Screening     Status: None   Collection Time: 02/08/2015  5:15 AM  Result Value Ref Range Status   MRSA by PCR NEGATIVE NEGATIVE Final    Comment:        The GeneXpert MRSA Assay (FDA approved for NASAL specimens only), is one component of a comprehensive MRSA colonization surveillance program. It is not intended to diagnose MRSA infection nor to guide or monitor treatment for MRSA infections.   C difficile quick scan w PCR reflex  Status: Abnormal   Collection Time: 02/02/15  4:12 PM  Result Value Ref Range Status   C Diff antigen POSITIVE (A) NEGATIVE Final   C Diff toxin POSITIVE (A) NEGATIVE Final   C Diff interpretation   Final    Positive for  toxigenic C. difficile, active toxin production present.    Comment: CRITICAL RESULT CALLED TO, READ BACK BY AND VERIFIED WITH: CHRISTINA MILES ON 02/02/15 AT 1715 BY [facility]     Radiology Reports Dg Chest 1 View  02/01/2015   CLINICAL DATA:  69 year old female admitted to the hospital on 02/23/2015 for hypotension. New onset of wheezing.  EXAM: CHEST  1 VIEW  COMPARISON:  Chest x-ray 01/31/2015.  FINDINGS: There is a right-sided internal jugular central venous catheter with tip terminating in the distal superior vena cava. Lung volumes are low. Patchy multifocal asymmetrically distributed interstitial and airspace disease, most evident throughout the mid to lower lungs bilaterally (left greater than right). Small bilateral pleural effusions (left greater than right). Pulmonary vasculature does not appear engorged. Heart size left is normal. The patient is rotated to the left on today's exam, resulting in distortion of the mediastinal contours and reduced diagnostic sensitivity and specificity for mediastinal pathology. Atherosclerosis in the thoracic aorta.  IMPRESSION: 1. Interval development of asymmetrically distributed patchy multifocal interstitial and airspace disease in the lungs bilaterally (left greater than right), concerning for multilobar pneumonia. 2. Small bilateral pleural effusions (left greater than right). 3. Atherosclerosis.   Electronically Signed   By: Trudie Reed M.D.   On: 02/01/2015 11:45   Dg Chest 2 View  01/19/2015   CLINICAL DATA:  Status post left fourth through sixth rib fractures 01/17/2015 after a fall. Vomiting and diarrhea today. Subsequent encounter.  EXAM: CHEST  2 VIEW  COMPARISON:  Plain films of the chest and left ribs 01/17/2015. PA and lateral chest 05/15/2013. CT chest 01/06/2010.  FINDINGS: Left fourth through seventh rib fractures are noted. No new fracture is identified. The lungs are clear. No pneumothorax or pleural effusion is identified. Heart size is  normal. Aortic atherosclerosis is noted.  IMPRESSION: Left fourth through seventh rib fractures. Negative for pneumothorax or acute cardiopulmonary disease.   Electronically Signed   By: Drusilla Kanner M.D.   On: 01/19/2015 14:14   Dg Ribs Unilateral W/chest Left  01/17/2015   CLINICAL DATA:  Status post fall today complaining of left posterior rib pain.  EXAM: LEFT RIBS AND CHEST - 3+ VIEW  COMPARISON:  May 15, 2013  FINDINGS: There are displaced fractures of the left fourth, fifth, sixth, seventh ribs. There questioned fractures of the left second and third ribs. There is no pneumothorax. The lungs are clear. The mediastinal contour and cardiac silhouette are normal.  IMPRESSION: Fractures of left ribs as described.   Electronically Signed   By: Sherian Rein M.D.   On: 01/17/2015 16:53   Dg Thoracic Spine 2 View  01/17/2015   CLINICAL DATA:  Patient status post fall while walking up the stairs. Left posterior rib and thoracic spine pain worse when breathing.  EXAM: THORACIC SPINE 2 VIEWS  COMPARISON:  Chest radiograph 05/15/2013  FINDINGS: Superior thoracic vertebral bodies are not well visualized. The mid and lower thoracic vertebral bodies are visualized and appear in normal height with normal intervertebral disc spaces. There is rightward curvature of the proximal thoracic spine demonstrated on the AP view. Multiple left-sided rib fractures, incompletely evaluated on current exam.  IMPRESSION: Rightward curvature of the proximal thoracic spine.  Recommend correlation for point tenderness at this location.  Otherwise relative preservation the vertebral body heights.  Incompletely visualized probable left rib fractures. See dedicated chest and rib report.   Electronically Signed   By: Annia Belt M.D.   On: 01/17/2015 16:52   Ct Head Wo Contrast  01/31/2015   CLINICAL DATA:  Lethargy and decreased responsiveness today.  EXAM: CT HEAD WITHOUT CONTRAST  TECHNIQUE: Contiguous axial images were  obtained from the base of the skull through the vertex without intravenous contrast.  COMPARISON:  CT head without contrast 01/19/2015. MRI brain 08/15/2009.  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 No acute infarct, hemorrhage, or mass lesion is otherwise present. The ventricles are of normal size. No significant extra-axial fluid collection is present. The basal ganglia are intact. The insular ribbon is intact.  The paranasal sinuses and mastoid air cells are clear.  IMPRESSION: 1. No acute intracranial abnormality or significant interval change. 2. 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   Electronically Signed   By: Marin Roberts M.D.   On: 01/31/2015 16:26   Ct Head Wo Contrast  01/19/2015   CLINICAL DATA:  Intractable vomiting over the last few days.  EXAM: CT HEAD WITHOUT CONTRAST  TECHNIQUE: Contiguous axial images were obtained from the base of the skull through the vertex without intravenous contrast.  COMPARISON:  Head CT 01/17/2015  FINDINGS: Brainstem is normal. Chronically known 1 cm lesion of the left cerebellum is again visible, consistent with a chronic cavernous angioma. Some adjacent brain low density is present as has been seen previously. No sign that there is any active process in this region. The cerebral hemispheres are normal. No evidence of old or acute infarction, mass lesion, hemorrhage, hydrocephalus or extra-axial collection. Chronic benign left calvarial lucency likely related to a hemangioma. No acute calvarial finding. Sinuses, middle ears and mastoids are clear.  IMPRESSION: No acute intracranial finding. 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 Based on the unchanged appearance, this would be on likely to relate to the clinical presentation.   Electronically Signed   By: Paulina Fusi M.D.   On: 01/19/2015 13:31   Ct Head Wo  Contrast  01/17/2015   CLINICAL DATA:  Larey Seat downstairs today.  Hit head.  EXAM: CT HEAD WITHOUT CONTRAST  CT CERVICAL SPINE WITHOUT CONTRAST  TECHNIQUE: Multidetector CT imaging of the head and cervical spine was performed following the standard protocol without intravenous contrast. Multiplanar CT image reconstructions of the cervical spine were also generated.  COMPARISON:  Head CT 08/14/2009  FINDINGS: CT HEAD FINDINGS  The ventricles are normal in size and configuration. No extra-axial fluid collections are identified. The gray-white differentiation is normal. No CT findings for acute intracranial process such as hemorrhage or infarction. No mass lesions. The brainstem and cerebellum are grossly normal.  The bony structures are intact. The paranasal sinuses and mastoid air cells are clear. The globes are intact.  CT CERVICAL SPINE FINDINGS  Degenerative cervical spondylosis with mild multilevel disc disease and facet disease. The cervical vertebral bodies are normally aligned. No acute fracture. The facets are normally aligned. Moderate C1-2 degenerative changes but no dens fracture. The spinal canal is quite generous. No spinal or significant foraminal stenosis. The lung apices are clear.  IMPRESSION: 1. No acute intracranial findings or skull fracture. 2. Mild degenerative cervical spondylosis but no acute cervical spine fracture.   Electronically Signed  By: Rudie Meyer M.D.   On: 01/17/2015 15:21   Ct Cervical Spine Wo Contrast  01/17/2015   CLINICAL DATA:  Larey Seat downstairs today.  Hit head.  EXAM: CT HEAD WITHOUT CONTRAST  CT CERVICAL SPINE WITHOUT CONTRAST  TECHNIQUE: Multidetector CT imaging of the head and cervical spine was performed following the standard protocol without intravenous contrast. Multiplanar CT image reconstructions of the cervical spine were also generated.  COMPARISON:  Head CT 08/14/2009  FINDINGS: CT HEAD FINDINGS  The ventricles are normal in size and configuration. No  extra-axial fluid collections are identified. The gray-white differentiation is normal. No CT findings for acute intracranial process such as hemorrhage or infarction. No mass lesions. The brainstem and cerebellum are grossly normal.  The bony structures are intact. The paranasal sinuses and mastoid air cells are clear. The globes are intact.  CT CERVICAL SPINE FINDINGS  Degenerative cervical spondylosis with mild multilevel disc disease and facet disease. The cervical vertebral bodies are normally aligned. No acute fracture. The facets are normally aligned. Moderate C1-2 degenerative changes but no dens fracture. The spinal canal is quite generous. No spinal or significant foraminal stenosis. The lung apices are clear.  IMPRESSION: 1. No acute intracranial findings or skull fracture. 2. Mild degenerative cervical spondylosis but no acute cervical spine fracture.   Electronically Signed   By: Rudie Meyer M.D.   On: 01/17/2015 15:21   Dg Chest Port 1 View  01/31/2015   Ricarda Frame, MD     01/31/2015  1:33 AM Central Venous Catheter Insertion Procedure Note JOURNII NIERMAN [id number] 11-03-1945  Procedure: Insertion of Central Venous Catheter Indications: Drug and/or fluid administration  Procedure Details Consent: Risks of procedure as well as the alternatives and risks  of each were explained to the (patient/caregiver).  Consent for  procedure obtained. Time Out: Verified patient identification, verified procedure,  site/side was marked, verified correct patient position, special  equipment/implants available, medications/allergies/relevent  history reviewed, required imaging and test results available.   Performed  Maximum sterile technique was used including antiseptics, cap,  gloves, gown, hand hygiene, mask and sheet. Skin prep: Chlorhexidine; local anesthetic administered A antimicrobial bonded/coated triple lumen catheter was placed in  the right internal jugular vein using the Seldinger technique.   Evaluation Blood flow good Complications: No apparent complications Patient did tolerate procedure well. Chest X-ray ordered to verify placement.  CXR: pending.  Ricarda Frame 01/31/2015, 1:32 AM    Dg Chest Port 1 View  02/01/2015   CLINICAL DATA:  Acute onset of generalized weakness. Initial encounter.  EXAM: PORTABLE CHEST - 1 VIEW  COMPARISON:  Chest radiograph performed 01/19/2015  FINDINGS: The lungs are well-aerated. Minimal bilateral atelectasis is noted. There is no evidence of pleural effusion or pneumothorax.  The cardiomediastinal silhouette is within normal limits. No acute osseous abnormalities are seen.  IMPRESSION: Minimal bilateral atelectasis noted.  Lungs otherwise clear.   Electronically Signed   By: Roanna Raider M.D.   On: 02/21/2015 02:33   Ct Renal Stone Study  01/19/2015   CLINICAL DATA:  Severe vomiting and hypotension.  EXAM: CT ABDOMEN AND PELVIS WITHOUT CONTRAST  TECHNIQUE: Multidetector CT imaging of the abdomen and pelvis was performed following the standard protocol without IV contrast.  COMPARISON:  Abdomen CT 07/05/2005  FINDINGS: Lower chest: No acute findings.  Hepatobiliary: No mass visualized on this unenhanced exam. Gallbladder is unremarkable.  Pancreas: No mass or inflammatory process visualized on this unenhanced exam. Diffuse fatty replacement of pancreas noted.  Spleen:  Within normal limits in size.  Adrenal Glands:  No masses identified.  Kidneys/Urinary tract: No evidence of urolithiasis or hydronephrosis.  Stomach/Bowel/Peritoneum: Sigmoid diverticulosis is noted. No evidence of diverticulitis or other inflammatory process. Normal appendix visualized.  Vascular/Lymphatic: No pathologically enlarged lymph nodes identified. No abdominal aortic aneurysm or other significant retroperitoneal abnormality demonstrated.  Reproductive:  No mass or other significant abnormality noted.  Other:  None.  Musculoskeletal:  No suspicious bone lesions identified.  IMPRESSION:  Sigmoid diverticulosis. No radiographic evidence of diverticulitis or other acute findings.   Electronically Signed   By: Myles Rosenthal M.D.   On: 01/19/2015 14:14     CBC  Recent Labs Lab 02/27/2015 0214 01/31/15 0520 02/01/15 0508 02/03/15 0429 02/04/15 0455 02/05/15 0514  WBC 13.1* 21.0* 16.1* 19.7* 16.7* 18.0*  HGB 11.2* 9.5* 8.6* 8.3* 9.1* 9.0*  HCT 34.5* 29.1* 25.9* 24.7* 26.8* 26.8*  PLT 356 260 227 243 259 262  MCV 86.1 84.0 84.6 82.6 84.0 83.0  MCH 27.9 27.3 28.2 27.6 28.5 27.8  MCHC 32.4 32.5 33.3 33.5 34.0 33.5  RDW 14.3 14.2 14.6* 14.2 14.4 14.5  LYMPHSABS 1.5  --   --   --   --   --   MONOABS 0.8  --   --   --   --   --   EOSABS 0.8*  --   --   --   --   --   BASOSABS 0.1  --   --   --   --   --     Chemistries   Recent Labs Lab 02/24/2015 0214  02/01/15 0508 02/02/15 0315 02/03/15 0429 02/04/15 0455 02/05/15 0514  NA 126*  < > 127* 133* 136 134* 133*  K 3.9  < > 3.7 3.7 3.1* 4.1 3.3*  CL 94*  < > 109 113* 115* 112* 110  CO2 14*  < > 13* 15* 16* 13* 18*  GLUCOSE 312*  < > 234* 206* 104* 217* 145*  BUN 17  < > CREATININE 1.99*  < > 1.47* 1.25* 1.19* 1.22* 1.05*  CALCIUM 8.9  < > 6.8* 7.6* 7.5* 7.6* 7.5*  MG 1.6*  < > 2.2 1.9 1.8 1.6* 1.8  AST 31  --   --   --   --   --   --   ALT 13*  --   --   --   --   --   --   ALKPHOS 155*  --   --   --   --   --   --   BILITOT 1.2  --   --   --   --   --   --   < > = values in this interval not displayed. ------------------------------------------------------------------------------------------------------------------ estimated creatinine clearance is 39.2 mL/min (by C-G formula based on Cr of 1.05). ------------------------------------------------------------------------------------------------------------------ No results for input(s): HGBA1C in the last 72 hours. ------------------------------------------------------------------------------------------------------------------ No results for input(s):  CHOL, HDL, LDLCALC, TRIG, CHOLHDL, LDLDIRECT in the last 72 hours. ------------------------------------------------------------------------------------------------------------------ No results for input(s): TSH, T4TOTAL, T3FREE, THYROIDAB in the last 72 hours.  Invalid input(s): FREET3 ------------------------------------------------------------------------------------------------------------------ No results for input(s): VITAMINB12, FOLATE, FERRITIN, TIBC, IRON, RETICCTPCT in the last 72 hours.  Coagulation profile No results for input(s): INR, PROTIME in the last 168 hours.  No results for input(s): DDIMER in the last 72 hours.  Cardiac Enzymes  Recent Labs Lab 02/26/2015 0214  TROPONINI 0.05*   ------------------------------------------------------------------------------------------------------------------ Invalid  input(s): POCBNP    Assessment & Plan   #1 DKA (diabetic ketoacidoses) -resolved. Transition from insulin drip to insulin pump, subcutaneous insulin. Appreciate endocrinology following. Waiting for family members to get insulin pump. #2 Dehydration ;improved #3 septic shock:due to cdiff colitis. /pneumonia;Continue Flagyl. 4.pneumonia he already finished 7 days of IV vancomycin and Zosyn. We will discontinue vancomycin, Zosyn. #5,. Acute renal failure with hyponatremia: Improved with fluids. Does have a metabolic acidosis started on bicarbonate tablets by nephrology. Patient nausea is improved.. Bicarbonate also improved. Acute renal  failure on chronic kidney disease stage III: Acute renal failure disease from diabetic ketoacidosis, infective colitis, hypovolemia. All this is improving.  #5recent ribs fracture- when necessary pain control, incentive spirometry try to avoid narcotics which may be playing a role in her lethargy and weakness  #6 Hypothyroidism - changed to by mouth thyroxine. 7.acute respiratory failure due to bilateral pneumonia; improved.Oxygen.  Finished IV vancomycin and Zosyn for 7 days. dis continue steroids at this time. Because she does not have any more wheezing.  #8. Severe  Malnutrition;due to  her acute illness started on dysphagia 1 diet by speech,.seen by RD, continue [employer] instant breakfast.  7. CODE STATUS DO NOT RESUSCITATE. Discussed this with patient's daughter did express that patient wants to be DO NOT RESUSCITATE. We will place the order in the chart.     Code Status Orders        Start     Ordered   Feb 28, 2015 0505  Full code   Continuous     February 28, 2015 0504           Consults endocrinology   DVT Prophylaxis  heparin  Lab Results  Component Value Date   PLT 262 02/05/2015     Time Spent in minutes  45 minutes of critical care time patient still on insulin drip blood glucose and needs to be monitored closely Mckenley Birenbaum M.D on 02/05/2015 at 12:03 PM  Between 7am to 6pm - Pager - [phone]  After 6pm go to [email or web address] - password [email or web address] [facility]  [facility]   Office  [phone]

## 2015-02-05 NOTE — Progress Notes (Signed)
Pharmacy Consult for Electrolyte Management   No Known Allergies  Patient Measurements: Height:  (154.9 cm) Weight: 119 lb 11.4 oz (54.3 kg) IBW/kg (Calculated) : 47.8  Vital Signs: Temp: 97.3 F (36.3 C) (09/07 0400) Temp Source: Oral (09/07 0400) BP: 95/52 mmHg (09/07 0500) Pulse Rate: 79 (09/07 0500) Intake/Output from previous day: 09/06 0701 - 09/07 0700 In: 2376.4 [P.O.:120; I.V.:1403.1; IV Piggyback:853.3] Out: 125 [Urine:125] Intake/Output from this shift: Total I/O In: 891.9 [I.V.:591.9; IV Piggyback:300] Out: -   Labs:  Recent Labs  02/03/15 0429 02/04/15 0455 02/05/15 0514  WBC 19.7* 16.7* 18.0*  HGB 8.3* 9.1* 9.0*  HCT 24.7* 26.8* 26.8*  PLT 243 259 262    Recent Labs  02/03/15 0429 02/04/15 0455 02/05/15 0514  NA 136 134* 133*  K 3.1* 4.1 3.3*  CL 115* 112* 110  CO2 16* 13* 18*  GLUCOSE 104* 217* 145*  BUN CREATININE 1.19* 1.22* 1.05*  CALCIUM 7.5* 7.6* 7.5*  MG 1.8 1.6* 1.8  PHOS 2.6 2.3* 2.6   Estimated Creatinine Clearance: 39.2 mL/min (by C-G formula based on Cr of 1.05).    Recent Labs  02/05/15 0308 02/05/15 0405 02/05/15 0508  GLUCAP 115* 102* 125*    Medical History: Past Medical History  Diagnosis Date  . Diabetes mellitus type 1     (prior saw Dr. Lodema Hong endo, would like to establish locally)  . Generalized headaches     frequent  . Hypothyroidism   . Convulsions/seizures     unknown type, nml EEG  . History of chicken pox   . Anemia   . Vitamin B12 deficiency 03/2012    normal IF    Medications:  Scheduled:  . antiseptic oral rinse  7 mL Mouth Rinse BID  . enoxaparin (LOVENOX) injection  40 mg Subcutaneous Q24H  . levothyroxine  50 mcg Intravenous Daily  . metronidazole  500 mg Intravenous Q8H  . ofloxacin  1 drop Both Eyes QID  . piperacillin-tazobactam (ZOSYN)  IV  3.375 g Intravenous 3 times per day  . predniSONE  40 mg Oral Q breakfast  . sodium bicarbonate  650 mg Oral TID  . sodium  chloride  3 mL Intravenous Q12H  . vancomycin  750 mg Intravenous Q24H   Infusions:  . dextrose 5 % and 0.9% NaCl 50 mL/hr at 02/04/15 0831  . insulin (NOVOLIN-R) infusion Stopped (02/05/15 0500)  . norepinephrine 2 mcg/min (02/04/15 0150)   PRN: acetaminophen **OR** acetaminophen, ipratropium-albuterol, ondansetron **OR** ondansetron (ZOFRAN) IV, sodium chloride, sodium chloride  Assessment: Phamracy consulted to assist in managing electrolytes in this 69 y/o F with DKA.   Plan:  Phosphorus and magnesium are low so will replace with magnesium sulfate 2 g iv once and sodium phosphate 10 mmol iv once. Will recheck electrolytes with AM labs.  Pharmacy to follow per consult.   9/7 Potassium 3.4 and Calcium 8.2 corrected. Will order KCl 20 mEq IV and 1 gram calcium gluconate IV. F/u labs in AM.  Erich Montane, [facility] Clinical Pharmacist   02/05/2015,6:08 AM

## 2015-02-06 ENCOUNTER — Encounter: Payer: Self-pay | Admitting: *Deleted

## 2015-02-06 DIAGNOSIS — Z09 Encounter for follow-up examination after completed treatment for conditions other than malignant neoplasm: Secondary | ICD-10-CM

## 2015-02-06 LAB — GLUCOSE, CAPILLARY
GLUCOSE-CAPILLARY: 159 mg/dL — AB (ref 65–99)
GLUCOSE-CAPILLARY: 76 mg/dL (ref 65–99)
GLUCOSE-CAPILLARY: 114 mg/dL — AB (ref 65–99)
GLUCOSE-CAPILLARY: 153 mg/dL — AB (ref 65–99)
GLUCOSE-CAPILLARY: 164 mg/dL — AB (ref 65–99)
GLUCOSE-CAPILLARY: 181 mg/dL — AB (ref 65–99)
Glucose-Capillary: 129 mg/dL — ABNORMAL HIGH (ref 65–99)
Glucose-Capillary: 151 mg/dL — ABNORMAL HIGH (ref 65–99)
Glucose-Capillary: 160 mg/dL — ABNORMAL HIGH (ref 65–99)
Glucose-Capillary: 178 mg/dL — ABNORMAL HIGH (ref 65–99)
Glucose-Capillary: 205 mg/dL — ABNORMAL HIGH (ref 65–99)
Glucose-Capillary: 210 mg/dL — ABNORMAL HIGH (ref 65–99)
Glucose-Capillary: 223 mg/dL — ABNORMAL HIGH (ref 65–99)
Glucose-Capillary: 223 mg/dL — ABNORMAL HIGH (ref 65–99)
Glucose-Capillary: 236 mg/dL — ABNORMAL HIGH (ref 65–99)
Glucose-Capillary: 81 mg/dL (ref 65–99)

## 2015-02-06 LAB — BASIC METABOLIC PANEL
Anion gap: 8 (ref 5–15)
BUN: 16 mg/dL (ref 6–20)
CHLORIDE: 110 mmol/L (ref 101–111)
CO2: 16 mmol/L — AB (ref 22–32)
CREATININE: 1.08 mg/dL — AB (ref 0.44–1.00)
Calcium: 7.7 mg/dL — ABNORMAL LOW (ref 8.9–10.3)
GFR calc Af Amer: >60 mL/min (ref >60)
GFR calc non Af Amer: 52 mL/min — ABNORMAL LOW (ref >60)
Glucose, Bld: 230 mg/dL — ABNORMAL HIGH (ref 65–99)
POTASSIUM: 3.4 mmol/L — AB (ref 3.5–5.1)
Sodium: 134 mmol/L — ABNORMAL LOW (ref 135–145)

## 2015-02-06 LAB — PHOSPHORUS: PHOSPHORUS: 2.5 mg/dL (ref 2.5–4.6)

## 2015-02-06 LAB — MAGNESIUM: Magnesium: 1.6 mg/dL — ABNORMAL LOW (ref 1.7–2.4)

## 2015-02-06 MED ORDER — POTASSIUM CHLORIDE 10 MEQ/100ML IV SOLN
10.0000 meq | INTRAVENOUS | Status: AC
  Administered 2015-02-06 (×2): 10 meq via INTRAVENOUS
  Filled 2015-02-06 (×2): qty 100

## 2015-02-06 MED ORDER — METRONIDAZOLE 500 MG PO TABS
500.0000 mg | ORAL_TABLET | Freq: Three times a day (TID) | ORAL | Status: DC
  Administered 2015-02-06 – 2015-02-15 (×27): 500 mg via ORAL
  Filled 2015-02-06 (×27): qty 1

## 2015-02-06 MED ORDER — INSULIN PUMP
SUBCUTANEOUS | Status: DC
  Administered 2015-02-06: 14:00:00 via SUBCUTANEOUS
  Filled 2015-02-06: qty 1

## 2015-02-06 MED ORDER — SODIUM CHLORIDE 0.9 % IV SOLN
1.0000 g | Freq: Once | INTRAVENOUS | Status: AC
  Administered 2015-02-06: 1 g via INTRAVENOUS
  Filled 2015-02-06: qty 10

## 2015-02-06 MED ORDER — ALPRAZOLAM 1 MG PO TABS
1.0000 mg | ORAL_TABLET | Freq: Three times a day (TID) | ORAL | Status: DC | PRN
  Administered 2015-02-07: 1 mg via ORAL
  Filled 2015-02-06: qty 1

## 2015-02-06 MED ORDER — CLOTRIMAZOLE 10 MG MT TROC
10.0000 mg | Freq: Every day | OROMUCOSAL | Status: DC
  Administered 2015-02-06 – 2015-02-15 (×40): 10 mg via ORAL
  Filled 2015-02-06 (×47): qty 1

## 2015-02-06 MED ORDER — MAGNESIUM SULFATE 2 GM/50ML IV SOLN
2.0000 g | Freq: Once | INTRAVENOUS | Status: AC
  Administered 2015-02-06: 2 g via INTRAVENOUS
  Filled 2015-02-06: qty 50

## 2015-02-06 NOTE — Progress Notes (Signed)
Speech Therapy note: was contacted by Dietician that pt continues to have a poor appetite and overall oral intake. Pt has expressed desire for a "salad". Pt is currently on a modified (food) consistency diet but will upgrade consistency to allow pt to have requested item. NSG to monitor for toleration and any s/s of aspiration. ST will f/u next 1-2 days as well. Reviewed chart notes.

## 2015-02-06 NOTE — Progress Notes (Signed)
ENDOCRINOLOGY Follow up  CHIEF COMPLAINT: Type 1 DM on insulin pump  HPI: 69 y.o. female seen in consultation for diabetes mellitus, type 1 on insulin pump therapy admitted with septic shock and DKA.   [name] is doing fine. She was seen early this am. Transitioned from insulin gtt to subQ insulin pump successfully. She is eating without any issues. No particular complaints.   CURRENT MEDICATIONS:  . antiseptic oral rinse  7 mL Mouth Rinse BID  . clotrimazole  10 mg Oral 5 X Daily  . enoxaparin (LOVENOX) injection  40 mg Subcutaneous Q24H  . levothyroxine  100 mcg Oral QAC breakfast  . metroNIDAZOLE  500 mg Oral 3 times per day  . ofloxacin  1 drop Both Eyes QID  . sodium bicarbonate  650 mg Oral TID  . sodium chloride  3 mL Intravenous Q12H    ALLERGIES:  No Known Allergies  PHYSICAL EXAMINATION:  BP 118/58 mmHg  Pulse 85  Temp(Src) 97.2 F (36.2 C) (Axillary)  Resp 25  Ht 5\' 1"  (1.549 m)  Wt 66.1 kg (145 lb 11.6 oz)  BMI 27.55 kg/m2  SpO2 97%  GENERAL: awake, alert, in NAD. CARDIAC:  Regular rate and rhythm  PULMONARY: breathing unlabored on room air.  ABDOMEN:  Diffusely soft, nontender, nondistended, normoactive bowel sounds.  EXTREMITIES:  No peripheral edema is present.    SKIN:  Warm, dry.   LABORATORY DATA:  01/17/15 -- Hgb A1c 10.7%  Results for orders placed or performed during the hospital encounter of 02/11/2015 (from the past 24 hour(s))  Glucose, capillary     Status: Abnormal   Collection Time: 02/05/15  3:08 PM  Result Value Ref Range   Glucose-Capillary 153 (H) 65 - 99 mg/dL  Glucose, capillary     Status: Abnormal   Collection Time: 02/05/15  4:15 PM  Result Value Ref Range   Glucose-Capillary 192 (H) 65 - 99 mg/dL  Glucose, capillary     Status: Abnormal   Collection Time: 02/05/15  5:24 PM  Result Value Ref Range   Glucose-Capillary 210 (H) 65 - 99 mg/dL  Glucose, capillary     Status: Abnormal   Collection Time: 02/05/15  6:09 PM  Result  Value Ref Range   Glucose-Capillary 202 (H) 65 - 99 mg/dL  Glucose, capillary     Status: Abnormal   Collection Time: 02/05/15  7:16 PM  Result Value Ref Range   Glucose-Capillary 185 (H) 65 - 99 mg/dL  Glucose, capillary     Status: Abnormal   Collection Time: 02/05/15  8:19 PM  Result Value Ref Range   Glucose-Capillary 130 (H) 65 - 99 mg/dL  Glucose, capillary     Status: Abnormal   Collection Time: 02/05/15  9:18 PM  Result Value Ref Range   Glucose-Capillary 114 (H) 65 - 99 mg/dL  Glucose, capillary     Status: Abnormal   Collection Time: 02/05/15 10:19 PM  Result Value Ref Range   Glucose-Capillary 115 (H) 65 - 99 mg/dL  Glucose, capillary     Status: Abnormal   Collection Time: 02/05/15 11:18 PM  Result Value Ref Range   Glucose-Capillary 107 (H) 65 - 99 mg/dL  Glucose, capillary     Status: Abnormal   Collection Time: 02/06/15 12:17 AM  Result Value Ref Range   Glucose-Capillary 129 (H) 65 - 99 mg/dL  Glucose, capillary     Status: Abnormal   Collection Time: 02/06/15  1:19 AM  Result Value Ref Range  Glucose-Capillary 160 (H) 65 - 99 mg/dL  Glucose, capillary     Status: Abnormal   Collection Time: 02/06/15  2:32 AM  Result Value Ref Range   Glucose-Capillary 223 (H) 65 - 99 mg/dL  Glucose, capillary     Status: Abnormal   Collection Time: 02/06/15  3:22 AM  Result Value Ref Range   Glucose-Capillary 236 (H) 65 - 99 mg/dL  Glucose, capillary     Status: Abnormal   Collection Time: 02/06/15  4:21 AM  Result Value Ref Range   Glucose-Capillary 223 (H) 65 - 99 mg/dL  Basic metabolic panel     Status: Abnormal   Collection Time: 02/06/15  4:27 AM  Result Value Ref Range   Sodium 134 (L) 135 - 145 mmol/L   Potassium 3.4 (L) 3.5 - 5.1 mmol/L   Chloride 110 101 - 111 mmol/L   CO2 16 (L) 22 - 32 mmol/L   Glucose, Bld 230 (H) 65 - 99 mg/dL   BUN 16 6 - 20 mg/dL   Creatinine, Ser 4.09 (H) 0.44 - 1.00 mg/dL   Calcium 7.7 (L) 8.9 - 10.3 mg/dL   GFR calc non Af Amer  52 (L) >60 mL/min   GFR calc Af Amer >60 >60 mL/min   Anion gap 8 5 - 15  Magnesium     Status: Abnormal   Collection Time: 02/06/15  4:27 AM  Result Value Ref Range   Magnesium 1.6 (L) 1.7 - 2.4 mg/dL  Phosphorus     Status: None   Collection Time: 02/06/15  4:27 AM  Result Value Ref Range   Phosphorus 2.5 2.5 - 4.6 mg/dL  Glucose, capillary     Status: Abnormal   Collection Time: 02/06/15  5:28 AM  Result Value Ref Range   Glucose-Capillary 178 (H) 65 - 99 mg/dL  Glucose, capillary     Status: Abnormal   Collection Time: 02/06/15  6:15 AM  Result Value Ref Range   Glucose-Capillary 151 (H) 65 - 99 mg/dL  Glucose, capillary     Status: Abnormal   Collection Time: 02/06/15  7:14 AM  Result Value Ref Range   Glucose-Capillary 159 (H) 65 - 99 mg/dL  Glucose, capillary     Status: None   Collection Time: 02/06/15  8:49 AM  Result Value Ref Range   Glucose-Capillary 76 65 - 99 mg/dL  Glucose, capillary     Status: None   Collection Time: 02/06/15  9:04 AM  Result Value Ref Range   Glucose-Capillary 81 65 - 99 mg/dL  Glucose, capillary     Status: Abnormal   Collection Time: 02/06/15  9:55 AM  Result Value Ref Range   Glucose-Capillary 114 (H) 65 - 99 mg/dL  Glucose, capillary     Status: Abnormal   Collection Time: 02/06/15 11:06 AM  Result Value Ref Range   Glucose-Capillary 153 (H) 65 - 99 mg/dL  Glucose, capillary     Status: Abnormal   Collection Time: 02/06/15 12:08 PM  Result Value Ref Range   Glucose-Capillary 164 (H) 65 - 99 mg/dL  Glucose, capillary     Status: Abnormal   Collection Time: 02/06/15  2:04 PM  Result Value Ref Range   Glucose-Capillary 181 (H) 65 - 99 mg/dL    ASSESSMENT:  1. DKA - resolved 2. Steroid-induced hyperglycemia - improving 3. Acute renal failure due to dehydration, improving 4. Septic shock, resolved 5. Hypothyroidism, stable   PLAN: 1. Continue subQ insulin via pump. Assisted patient in resuming  her pump this morning after all  supplies and insulin were available. Her home settings were left unchanged as follows:  Basal rates: 0000 0.30 units/hr 0800 0.30 units/hr 2200 0.25 units/hr  Bolus settings Insulin:carb ratio 1:21 Sensitivity: 100 BG target: 140 Insulin on board: 5 hours  2. IV insulin discontinued 1 hour after insulin pump was restarted and  Hourly BG checks were recommended x 3 hours. 3. Glucometer checks 5 x daily starting now, AC/HS/3am 4. Will ensure pt has f/u with Dr. Tedd Sias upon discharge 5. Continue levothyroxine 100 mcg daily.  Will follow along with you. Please page with any concerns.  Thank you for this consult.   Doylene Canning, MD [facility]

## 2015-02-06 NOTE — Patient Outreach (Signed)
[facility] Customer service manager [facility]) Care Management  02/06/2015  [name] March 22, 1946 [id number]     Pt still inpatient. Will outreach at discharge.   Plan: communication order sent to [facility] to outreach pt if sent back to SNF [facility] will outreach pt if sent home.   Costella Hatcher RN, BSN  [facility] Care Management [phone])

## 2015-02-06 NOTE — Progress Notes (Signed)
Pharmacy Consult for Electrolyte Management   No Known Allergies  Patient Measurements: Height:  (154.9 cm) Weight: 145 lb 11.6 oz (66.1 kg) IBW/kg (Calculated) : 47.8  Vital Signs: Temp: 97.7 F (36.5 C) (09/08 0500) Temp Source: Oral (09/08 0500) BP: 101/82 mmHg (09/08 0600) Pulse Rate: 101 (09/08 0600) Intake/Output from previous day: 09/07 0701 - 09/08 0700 In: 1855.9 [I.V.:1355.9; IV Piggyback:500] Out: 150 [Urine:150] Intake/Output from this shift: Total I/O In: 835.4 [I.V.:735.4; IV Piggyback:100] Out: 150 [Urine:150]  Labs:  Recent Labs  02/04/15 0455 02/05/15 0514  WBC 16.7* 18.0*  HGB 9.1* 9.0*  HCT 26.8* 26.8*  PLT 259 262    Recent Labs  02/04/15 0455 02/05/15 0514 02/06/15 0427  NA 134* 133* 134*  K 4.1 3.3* 3.4*  CL 112* 110 110  CO2 13* 18* 16*  GLUCOSE 217* 145* 230*  BUN CREATININE 1.22* 1.05* 1.08*  CALCIUM 7.6* 7.5* 7.7*  MG 1.6* 1.8 1.6*  PHOS 2.3* 2.6 2.5   Estimated Creatinine Clearance: 44 mL/min (by C-G formula based on Cr of 1.08).    Recent Labs  02/06/15 0421 02/06/15 0528 02/06/15 0615  GLUCAP 223* 178* 151*    Medical History: Past Medical History  Diagnosis Date  . Diabetes mellitus type 1     (prior saw Dr. Lodema Hong endo, would like to establish locally)  . Generalized headaches     frequent  . Hypothyroidism   . Convulsions/seizures     unknown type, nml EEG  . History of chicken pox   . Anemia   . Vitamin B12 deficiency 03/2012    normal IF    Medications:  Scheduled:  . antiseptic oral rinse  7 mL Mouth Rinse BID  . calcium gluconate  1 g Intravenous Once  . enoxaparin (LOVENOX) injection  40 mg Subcutaneous Q24H  . levothyroxine  100 mcg Oral QAC breakfast  . magnesium sulfate 1 - 4 g bolus IVPB  2 g Intravenous Once  . metronidazole  500 mg Intravenous Q8H  . ofloxacin  1 drop Both Eyes QID  . potassium chloride  10 mEq Intravenous Q1 Hr x 2  . sodium bicarbonate  650 mg Oral  TID  . sodium chloride  3 mL Intravenous Q12H   Infusions:  . dextrose 5 % and 0.9% NaCl 50 mL/hr at 02/05/15 1633  . insulin (NOVOLIN-R) infusion 2.7 Units/hr (02/06/15 1610)  . norepinephrine 2 mcg/min (02/05/15 1110)   PRN: acetaminophen **OR** acetaminophen, ipratropium-albuterol, ondansetron **OR** ondansetron (ZOFRAN) IV, sodium chloride, sodium chloride  Assessment: Phamracy consulted to assist in managing electrolytes in this 69 y/o F with DKA.   Plan:  Phosphorus and magnesium are low so will replace with magnesium sulfate 2 g iv once and sodium phosphate 10 mmol iv once. Will recheck electrolytes with AM labs.  Pharmacy to follow per consult.   9/7 Potassium 3.4 and Calcium 8.2 corrected. Will order KCl 20 mEq IV and 1 gram calcium gluconate IV. F/u labs in AM.  9/8 Potassium 3.4, Mg 1.6, and Ca 7.7. KCl 20 mEq, calcium gluconate 1 gram and magnesium sulfate 2 grams ordered. F/u labs in AM.  Aaren Krog S, [facility] Clinical Pharmacist   02/06/2015,6:55 AM

## 2015-02-06 NOTE — Progress Notes (Signed)
[facility] [place] at [facility]                                                                                                                                                                                            Patient Demographics   [name], is a 69 y.o. female, DOB - 1945/07/07, ZOX:[id number]  Admit date - 02/23/2015   Admitting Physician Oralia Manis, MD  Outpatient Primary MD for the patient is Lauro Regulus., MD   LOS - 7  Subjective: Patient was recently hospitalized with nausea vomiting and DKA. He was discharged to rehabilitation. Patient at the skilled nursing facility was noted to have hypotension.on insulin drip since admission. Seen today,says that she is better but does not like that food that she is on now.off  levaphed. Review of Systems:   Limited due to patient being lethargic   Vitals:   Filed Vitals:   02/06/15 0539 02/06/15 0600 02/06/15 0700 02/06/15 0800  BP:  101/82 131/72 118/58  Pulse:  101 89 85  Temp:   97.5 F (36.4 C)   TempSrc:   Oral   Resp:  26 24 25   Height:      Weight: 66.1 kg (145 lb 11.6 oz)     SpO2:  99% 97% 97%    Wt Readings from Last 3 Encounters:  02/06/15 66.1 kg (145 lb 11.6 oz)  01/22/15 56.8 kg (125 lb 3.5 oz)  01/17/15 53.071 kg (117 lb)     Intake/Output Summary (Last 24 hours) at 02/06/15 0915 Last data filed at 02/06/15 0853  Gross per 24 hour  Intake 1858.87 ml  Output    150 ml  Net 1708.87 ml    Physical Exam:   GENERAL lethargic HEAD, EYES, EARS, NOSE AND THROAT: Atraumatic, normocephalic. Extraocular muscles are intact. Pupils equal and reactive to light. Sclerae anicteric. No conjunctival injection. No oro-pharyngeal erythema.  NECK: Supple. There is no jugular venous distention. No bruits, no lymphadenopathy, no thyromegaly.  HEART: Regular rate and rhythm,. No murmurs, no rubs, no clicks.  LUNGS: Bilateral expiratory wheeze in all lung fields. ABDOMEN:  Soft, flat, nontender, nondistended. Has good bowel sounds. No hepatosplenomegaly appreciated.  EXTREMITIES: No evidence of any cyanosis, clubbing, or peripheral edema.  +2 pedal and radial pulses bilaterally.  NEUROLOGIC: Patient is sleepy but able to move all extremities no focal deficit appreciated SKIN: Moist and warm with no rashes appreciated.  Psych: Not anxious, depressed LN: No inguinal LN enlargement    Antibiotics   Anti-infectives    Start     Dose/Rate Route Frequency Ordered Stop   02/02/15  1815  metroNIDAZOLE (FLAGYL) IVPB 500 mg     500 mg 100 mL/hr over 60 Minutes Intravenous Every 8 hours 02/02/15 1804     01/31/15 1600  vancomycin (VANCOCIN) IVPB 750 mg/150 ml premix  Status:  Discontinued     750 mg 150 mL/hr over 60 Minutes Intravenous Every 36 hours 02-10-2015 0516 02-10-15 1228   01/31/15 0400  vancomycin (VANCOCIN) IVPB 750 mg/150 ml premix  Status:  Discontinued     750 mg 150 mL/hr over 60 Minutes Intravenous Every 24 hours 2015-02-10 1228 02/05/15 1148   10-Feb-2015 0600  piperacillin-tazobactam (ZOSYN) IVPB 3.375 g  Status:  Discontinued     3.375 g 12.5 mL/hr over 240 Minutes Intravenous 3 times per day 02/10/15 0516 02/05/15 1148   02/10/2015 0345  vancomycin (VANCOCIN) IVPB 1000 mg/200 mL premix     1,000 mg 200 mL/hr over 60 Minutes Intravenous  Once 02-10-2015 0331 10-Feb-2015 0506   Feb 10, 2015 0345  piperacillin-tazobactam (ZOSYN) IVPB 3.375 g  Status:  Discontinued     3.375 g 12.5 mL/hr over 240 Minutes Intravenous  Once 2015/02/10 0331 2015/02/10 0745      Medications   Scheduled Meds: . antiseptic oral rinse  7 mL Mouth Rinse BID  . calcium gluconate  1 g Intravenous Once  . enoxaparin (LOVENOX) injection  40 mg Subcutaneous Q24H  . levothyroxine  100 mcg Oral QAC breakfast  . magnesium sulfate 1 - 4 g bolus IVPB  2 g Intravenous Once  . metronidazole  500 mg Intravenous Q8H  . ofloxacin  1 drop Both Eyes QID  . potassium chloride  10 mEq Intravenous Q1 Hr  x 2  . sodium bicarbonate  650 mg Oral TID  . sodium chloride  3 mL Intravenous Q12H   Continuous Infusions: . dextrose 5 % and 0.9% NaCl 50 mL/hr at 02/05/15 1633  . insulin (NOVOLIN-R) infusion 3 mL/hr at 02/06/15 0700  . norepinephrine 2 mcg/min (02/05/15 1110)   PRN Meds:.acetaminophen **OR** acetaminophen, ipratropium-albuterol, ondansetron **OR** ondansetron (ZOFRAN) IV, sodium chloride, sodium chloride   Data Review:   Micro Results Recent Results (from the past 240 hour(s))  Culture, blood (routine x 2)     Status: None   Collection Time: 10-Feb-2015  3:34 AM  Result Value Ref Range Status   Specimen Description BLOOD BLOOD RIGHT FOREARM  Final   Special Requests BOTTLES DRAWN AEROBIC AND ANAEROBIC  Final   Culture  Setup Time   Final    GRAM POSITIVE RODS IN BOTH AEROBIC AND ANAEROBIC BOTTLES CRITICAL RESULT CALLED TO, READ BACK BY AND VERIFIED WITH: BRITTANY RUDD ON 02/10/2015 AT 1752 BY TB CONFIRMED BY TB/MS.    Culture   Final    BACILLUS SPECIES IN BOTH AEROBIC AND ANAEROBIC BOTTLES Results consistent with contamination.    Report Status 02/03/2015 FINAL  Final  Culture, blood (routine x 2)     Status: None   Collection Time: February 10, 2015  3:34 AM  Result Value Ref Range Status   Specimen Description BLOOD RIGHT HAND  Final   Special Requests BOTTLES DRAWN AEROBIC AND ANAEROBIC  Final   Culture NO GROWTH 5 DAYS  Final   Report Status 02/04/2015 FINAL  Final  MRSA PCR Screening     Status: None   Collection Time: 10-Feb-2015  5:15 AM  Result Value Ref Range Status   MRSA by PCR NEGATIVE NEGATIVE Final    Comment:        The GeneXpert MRSA Assay (FDA  approved for NASAL specimens only), is one component of a comprehensive MRSA colonization surveillance program. It is not intended to diagnose MRSA infection nor to guide or monitor treatment for MRSA infections.   C difficile quick scan w PCR reflex     Status: Abnormal   Collection Time: 02/02/15  4:12 PM   Result Value Ref Range Status   C Diff antigen POSITIVE (A) NEGATIVE Final   C Diff toxin POSITIVE (A) NEGATIVE Final   C Diff interpretation   Final    Positive for toxigenic C. difficile, active toxin production present.    Comment: CRITICAL RESULT CALLED TO, READ BACK BY AND VERIFIED WITH: CHRISTINA MILES ON 02/02/15 AT 1715 BY [facility]     Radiology Reports Dg Chest 1 View  02/01/2015   CLINICAL DATA:  69 year old female admitted to the hospital on 02/09/2015 for hypotension. New onset of wheezing.  EXAM: CHEST  1 VIEW  COMPARISON:  Chest x-ray 01/31/2015.  FINDINGS: There is a right-sided internal jugular central venous catheter with tip terminating in the distal superior vena cava. Lung volumes are low. Patchy multifocal asymmetrically distributed interstitial and airspace disease, most evident throughout the mid to lower lungs bilaterally (left greater than right). Small bilateral pleural effusions (left greater than right). Pulmonary vasculature does not appear engorged. Heart size left is normal. The patient is rotated to the left on today's exam, resulting in distortion of the mediastinal contours and reduced diagnostic sensitivity and specificity for mediastinal pathology. Atherosclerosis in the thoracic aorta.  IMPRESSION: 1. Interval development of asymmetrically distributed patchy multifocal interstitial and airspace disease in the lungs bilaterally (left greater than right), concerning for multilobar pneumonia. 2. Small bilateral pleural effusions (left greater than right). 3. Atherosclerosis.   Electronically Signed   By: Trudie Reed M.D.   On: 02/01/2015 11:45   Dg Chest 2 View  01/19/2015   CLINICAL DATA:  Status post left fourth through sixth rib fractures 01/17/2015 after a fall. Vomiting and diarrhea today. Subsequent encounter.  EXAM: CHEST  2 VIEW  COMPARISON:  Plain films of the chest and left ribs 01/17/2015. PA and lateral chest 05/15/2013. CT chest 01/06/2010.  FINDINGS: Left  fourth through seventh rib fractures are noted. No new fracture is identified. The lungs are clear. No pneumothorax or pleural effusion is identified. Heart size is normal. Aortic atherosclerosis is noted.  IMPRESSION: Left fourth through seventh rib fractures. Negative for pneumothorax or acute cardiopulmonary disease.   Electronically Signed   By: Drusilla Kanner M.D.   On: 01/19/2015 14:14   Dg Ribs Unilateral W/chest Left  01/17/2015   CLINICAL DATA:  Status post fall today complaining of left posterior rib pain.  EXAM: LEFT RIBS AND CHEST - 3+ VIEW  COMPARISON:  May 15, 2013  FINDINGS: There are displaced fractures of the left fourth, fifth, sixth, seventh ribs. There questioned fractures of the left second and third ribs. There is no pneumothorax. The lungs are clear. The mediastinal contour and cardiac silhouette are normal.  IMPRESSION: Fractures of left ribs as described.   Electronically Signed   By: Sherian Rein M.D.   On: 01/17/2015 16:53   Dg Thoracic Spine 2 View  01/17/2015   CLINICAL DATA:  Patient status post fall while walking up the stairs. Left posterior rib and thoracic spine pain worse when breathing.  EXAM: THORACIC SPINE 2 VIEWS  COMPARISON:  Chest radiograph 05/15/2013  FINDINGS: Superior thoracic vertebral bodies are not well visualized. The mid and lower thoracic vertebral  bodies are visualized and appear in normal height with normal intervertebral disc spaces. There is rightward curvature of the proximal thoracic spine demonstrated on the AP view. Multiple left-sided rib fractures, incompletely evaluated on current exam.  IMPRESSION: Rightward curvature of the proximal thoracic spine. Recommend correlation for point tenderness at this location.  Otherwise relative preservation the vertebral body heights.  Incompletely visualized probable left rib fractures. See dedicated chest and rib report.   Electronically Signed   By: Annia Belt M.D.   On: 01/17/2015 16:52   Ct Head Wo  Contrast  01/31/2015   CLINICAL DATA:  Lethargy and decreased responsiveness today.  EXAM: CT HEAD WITHOUT CONTRAST  TECHNIQUE: Contiguous axial images were obtained from the base of the skull through the vertex without intravenous contrast.  COMPARISON:  CT head without contrast 01/19/2015. MRI brain 08/15/2009.  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 No acute infarct, hemorrhage, or mass lesion is otherwise present. The ventricles are of normal size. No significant extra-axial fluid collection is present. The basal ganglia are intact. The insular ribbon is intact.  The paranasal sinuses and mastoid air cells are clear.  IMPRESSION: 1. No acute intracranial abnormality or significant interval change. 2. 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   Electronically Signed   By: Marin Roberts M.D.   On: 01/31/2015 16:26   Ct Head Wo Contrast  01/19/2015   CLINICAL DATA:  Intractable vomiting over the last few days.  EXAM: CT HEAD WITHOUT CONTRAST  TECHNIQUE: Contiguous axial images were obtained from the base of the skull through the vertex without intravenous contrast.  COMPARISON:  Head CT 01/17/2015  FINDINGS: Brainstem is normal. Chronically known 1 cm lesion of the left cerebellum is again visible, consistent with a chronic cavernous angioma. Some adjacent brain low density is present as has been seen previously. No sign that there is any active process in this region. The cerebral hemispheres are normal. No evidence of old or acute infarction, mass lesion, hemorrhage, hydrocephalus or extra-axial collection. Chronic benign left calvarial lucency likely related to a hemangioma. No acute calvarial finding. Sinuses, middle ears and mastoids are clear.  IMPRESSION: No acute intracranial finding. 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 Based on the unchanged appearance, this would  be on likely to relate to the clinical presentation.   Electronically Signed   By: Paulina Fusi M.D.   On: 01/19/2015 13:31   Ct Head Wo Contrast  01/17/2015   CLINICAL DATA:  Larey Seat downstairs today.  Hit head.  EXAM: CT HEAD WITHOUT CONTRAST  CT CERVICAL SPINE WITHOUT CONTRAST  TECHNIQUE: Multidetector CT imaging of the head and cervical spine was performed following the standard protocol without intravenous contrast. Multiplanar CT image reconstructions of the cervical spine were also generated.  COMPARISON:  Head CT 08/14/2009  FINDINGS: CT HEAD FINDINGS  The ventricles are normal in size and configuration. No extra-axial fluid collections are identified. The gray-white differentiation is normal. No CT findings for acute intracranial process such as hemorrhage or infarction. No mass lesions. The brainstem and cerebellum are grossly normal.  The bony structures are intact. The paranasal sinuses and mastoid air cells are clear. The globes are intact.  CT CERVICAL SPINE FINDINGS  Degenerative cervical spondylosis with mild multilevel disc disease and facet disease. The cervical vertebral bodies are normally aligned. No acute fracture. The facets are normally aligned. Moderate C1-2 degenerative changes but no dens  fracture. The spinal canal is quite generous. No spinal or significant foraminal stenosis. The lung apices are clear.  IMPRESSION: 1. No acute intracranial findings or skull fracture. 2. Mild degenerative cervical spondylosis but no acute cervical spine fracture.   Electronically Signed   By: Rudie Meyer M.D.   On: 01/17/2015 15:21   Ct Cervical Spine Wo Contrast  01/17/2015   CLINICAL DATA:  Larey Seat downstairs today.  Hit head.  EXAM: CT HEAD WITHOUT CONTRAST  CT CERVICAL SPINE WITHOUT CONTRAST  TECHNIQUE: Multidetector CT imaging of the head and cervical spine was performed following the standard protocol without intravenous contrast. Multiplanar CT image reconstructions of the cervical spine were  also generated.  COMPARISON:  Head CT 08/14/2009  FINDINGS: CT HEAD FINDINGS  The ventricles are normal in size and configuration. No extra-axial fluid collections are identified. The gray-white differentiation is normal. No CT findings for acute intracranial process such as hemorrhage or infarction. No mass lesions. The brainstem and cerebellum are grossly normal.  The bony structures are intact. The paranasal sinuses and mastoid air cells are clear. The globes are intact.  CT CERVICAL SPINE FINDINGS  Degenerative cervical spondylosis with mild multilevel disc disease and facet disease. The cervical vertebral bodies are normally aligned. No acute fracture. The facets are normally aligned. Moderate C1-2 degenerative changes but no dens fracture. The spinal canal is quite generous. No spinal or significant foraminal stenosis. The lung apices are clear.  IMPRESSION: 1. No acute intracranial findings or skull fracture. 2. Mild degenerative cervical spondylosis but no acute cervical spine fracture.   Electronically Signed   By: Rudie Meyer M.D.   On: 01/17/2015 15:21   US Renal  02/05/2015   CLINICAL DATA:  Acute renal failure  EXAM: RENAL / URINARY TRACT ULTRASOUND COMPLETE  COMPARISON:  None.  FINDINGS: Right Kidney:  Length: 8.4 cm. Echogenicity within normal limits. No mass or hydronephrosis visualized.  Left Kidney:  Length: 9.1 cm. Echogenicity within normal limits. No mass or hydronephrosis visualized.  Bladder:  Appears normal for degree of bladder distention.  Note is made of mild ascites.  IMPRESSION: Mild ascites.  Normal-appearing kidneys.   Electronically Signed   By: Alcide Clever M.D.   On: 02/05/2015 12:36   Dg Chest Port 1 View  01/31/2015   Ricarda Frame, MD     01/31/2015  1:33 AM Central Venous Catheter Insertion Procedure Note CESIAH WESTLEY [id number] July 05, 1945  Procedure: Insertion of Central Venous Catheter Indications: Drug and/or fluid administration  Procedure Details Consent: Risks of  procedure as well as the alternatives and risks  of each were explained to the (patient/caregiver).  Consent for  procedure obtained. Time Out: Verified patient identification, verified procedure,  site/side was marked, verified correct patient position, special  equipment/implants available, medications/allergies/relevent  history reviewed, required imaging and test results available.   Performed  Maximum sterile technique was used including antiseptics, cap,  gloves, gown, hand hygiene, mask and sheet. Skin prep: Chlorhexidine; local anesthetic administered A antimicrobial bonded/coated triple lumen catheter was placed in  the right internal jugular vein using the Seldinger technique.  Evaluation Blood flow good Complications: No apparent complications Patient did tolerate procedure well. Chest X-ray ordered to verify placement.  CXR: pending.  Ricarda Frame 01/31/2015, 1:32 AM    Dg Chest Port 1 View  02/25/2015   CLINICAL DATA:  Acute onset of generalized weakness. Initial encounter.  EXAM: PORTABLE CHEST - 1 VIEW  COMPARISON:  Chest radiograph performed 01/19/2015  FINDINGS: The  lungs are well-aerated. Minimal bilateral atelectasis is noted. There is no evidence of pleural effusion or pneumothorax.  The cardiomediastinal silhouette is within normal limits. No acute osseous abnormalities are seen.  IMPRESSION: Minimal bilateral atelectasis noted.  Lungs otherwise clear.   Electronically Signed   By: Roanna Raider M.D.   On: 02/26/2015 02:33   Ct Renal Stone Study  01/19/2015   CLINICAL DATA:  Severe vomiting and hypotension.  EXAM: CT ABDOMEN AND PELVIS WITHOUT CONTRAST  TECHNIQUE: Multidetector CT imaging of the abdomen and pelvis was performed following the standard protocol without IV contrast.  COMPARISON:  Abdomen CT 07/05/2005  FINDINGS: Lower chest: No acute findings.  Hepatobiliary: No mass visualized on this unenhanced exam. Gallbladder is unremarkable.  Pancreas: No mass or inflammatory process  visualized on this unenhanced exam. Diffuse fatty replacement of pancreas noted.  Spleen:  Within normal limits in size.  Adrenal Glands:  No masses identified.  Kidneys/Urinary tract: No evidence of urolithiasis or hydronephrosis.  Stomach/Bowel/Peritoneum: Sigmoid diverticulosis is noted. No evidence of diverticulitis or other inflammatory process. Normal appendix visualized.  Vascular/Lymphatic: No pathologically enlarged lymph nodes identified. No abdominal aortic aneurysm or other significant retroperitoneal abnormality demonstrated.  Reproductive:  No mass or other significant abnormality noted.  Other:  None.  Musculoskeletal:  No suspicious bone lesions identified.  IMPRESSION: Sigmoid diverticulosis. No radiographic evidence of diverticulitis or other acute findings.   Electronically Signed   By: Myles Rosenthal M.D.   On: 01/19/2015 14:14     CBC  Recent Labs Lab 01/31/15 0520 02/01/15 0508 02/03/15 0429 02/04/15 0455 02/05/15 0514  WBC 21.0* 16.1* 19.7* 16.7* 18.0*  HGB 9.5* 8.6* 8.3* 9.1* 9.0*  HCT 29.1* 25.9* 24.7* 26.8* 26.8*  PLT 260 227 243 259 262  MCV 84.0 84.6 82.6 84.0 83.0  MCH 27.3 28.2 27.6 28.5 27.8  MCHC 32.5 33.3 33.5 34.0 33.5  RDW 14.2 14.6* 14.2 14.4 14.5    Chemistries   Recent Labs Lab 02/02/15 0315 02/03/15 0429 02/04/15 0455 02/05/15 0514 02/06/15 0427  NA 133* 136 134* 133* 134*  K 3.7 3.1* 4.1 3.3* 3.4*  CL 113* 115* 112* 110 110  CO2 15* 16* 13* 18* 16*  GLUCOSE 206* 104* 217* 145* 230*  BUN CREATININE 1.25* 1.19* 1.22* 1.05* 1.08*  CALCIUM 7.6* 7.5* 7.6* 7.5* 7.7*  MG 1.9 1.8 1.6* 1.8 1.6*   ------------------------------------------------------------------------------------------------------------------ estimated creatinine clearance is 44 mL/min (by C-G formula based on Cr of 1.08). ------------------------------------------------------------------------------------------------------------------ No results for input(s):  HGBA1C in the last 72 hours. ------------------------------------------------------------------------------------------------------------------ No results for input(s): CHOL, HDL, LDLCALC, TRIG, CHOLHDL, LDLDIRECT in the last 72 hours. ------------------------------------------------------------------------------------------------------------------ No results for input(s): TSH, T4TOTAL, T3FREE, THYROIDAB in the last 72 hours.  Invalid input(s): FREET3 ------------------------------------------------------------------------------------------------------------------ No results for input(s): VITAMINB12, FOLATE, FERRITIN, TIBC, IRON, RETICCTPCT in the last 72 hours.  Coagulation profile No results for input(s): INR, PROTIME in the last 168 hours.  No results for input(s): DDIMER in the last 72 hours.  Cardiac Enzymes No results for input(s): CKMB, TROPONINI, MYOGLOBIN in the last 168 hours.  Invalid input(s): CK ------------------------------------------------------------------------------------------------------------------ Invalid input(s): POCBNP    Assessment & Plan   #1 DKA (diabetic ketoacidoses) -resolved. Transition from insulin drip to insulin pump, subcutaneous insulin. Appreciate endocrinology following. Waiting for family members to get insulin pump. #2 Dehydration ;improved #3 septic shock:due to cdiff colitis. /pneumonia;Continue Flagyl.  4.pneumonia he already finished 7 days of IV vancomycin and Zosyn.stopped vanco and zosyn# 5,. Acute  renal failure with hyponatremia: Improved with fluids. Does have a metabolic acidosis started on bicarbonate tablets by nephrology. Patient nausea is improved.. Bicarbonate also improved. Acute renal  failure on chronic kidney disease stage III: Acute renal failure disease from diabetic ketoacidosis, infective colitis, hypovolemia. All this is improving.  #5recent ribs fracture- when necessary pain control, incentive spirometry try to  avoid narcotics which may be playing a role in her lethargy and weakness  #6 Hypothyroidism - changed to by mouth thyroxine. 7.acute respiratory failure due to bilateral pneumonia; improved.Oxygen. Finished IV vancomycin and Zosyn for 7 days. dis continue steroids at this time. Because she does not have any more wheezing.  #8. Severe  Malnutrition;due to  her acute illness started on dysphagia 1 diet by speech,.reeval by speech to see if we can upgrde diet. seen by RD, continue Carn[employer] instant breakfast.  7. CODE STATUS DO NOT RESUSCITATE. Discussed this with patient's daughter did express that patient wants to be DO NOT RESUSCITATE.      Code Status Orders        Start     Ordered   2015-02-11 0505  Full code   Continuous     February 11, 2015 0504           Consults endocrinology   DVT Prophylaxis  heparin  Lab Results  Component Value Date   PLT 262 02/05/2015     Time Spent in minutes  45 minutes of critical care time patient still on insulin drip blood glucose and needs to be monitored closely Ardean Melroy M.D on 02/06/2015 at 9:15 AM  Between 7am to 6pm - Pager - [phone]  After 6pm go to [email or web address] - password [email or web address] [facility]  [facility]   Office  [phone]

## 2015-02-06 NOTE — Progress Notes (Signed)
[facility]  ROUNDING NOTE   Subjective:   Off norepinephrine this morning.  Serum bicarb 16   Objective:  Vital signs in last 24 hours:  Temp:  [97.5 F (36.4 C)-97.8 F (36.6 C)] 97.5 F (36.4 C) (09/08 0700) Pulse Rate:  [73-101] 85 (09/08 0800) Resp:  [12-31] 25 (09/08 0800) BP: (86-131)/(40-90) 118/58 mmHg (09/08 0800) SpO2:  [91 %-100 %] 97 % (09/08 0800) Weight:  [66.1 kg (145 lb 11.6 oz)] 66.1 kg (145 lb 11.6 oz) (09/08 0539)  Weight change:  Filed Weights   02/10/2015 0210 02/11/2015 0500 02/06/15 0539  Weight: 53.071 kg (117 lb) 54.3 kg (119 lb 11.4 oz) 66.1 kg (145 lb 11.6 oz)    Intake/Output: I/O last 3 completed shifts: In: 2958.2 [I.V.:2108.2; IV Piggyback:850] Out: 150 [Urine:150]   Intake/Output this shift:  Total I/O In: 150 [IV Piggyback:150] Out: -   Physical Exam: General: NAD  Head: Normocephalic, atraumatic. Moist oral mucosal membranes  Eyes: Anicteric, PERRL  Neck: Supple, trachea midline  Lungs:  Clear to auscultation  Heart: Regular rate and rhythm  Abdomen:  Soft, nontender,   Extremities: No peripheral edema.  Neurologic: Nonfocal, moving all four extremities  Skin: No lesions       Basic Metabolic Panel:  Recent Labs Lab 02/02/15 0315 02/03/15 0429 02/04/15 0455 02/05/15 0514 02/06/15 0427  NA 133* 136 134* 133* 134*  K 3.7 3.1* 4.1 3.3* 3.4*  CL 113* 115* 112* 110 110  CO2 15* 16* 13* 18* 16*  GLUCOSE 206* 104* 217* 145* 230*  BUN '9 11 15 15 16  ' CREATININE 1.25* 1.19* 1.22* 1.05* 1.08*  CALCIUM 7.6* 7.5* 7.6* 7.5* 7.7*  MG 1.9 1.8 1.6* 1.8 1.6*  PHOS 3.2 2.6 2.3* 2.6 2.5    Liver Function Tests: No results for input(s): AST, ALT, ALKPHOS, BILITOT, PROT, ALBUMIN in the last 168 hours. No results for input(s): LIPASE, AMYLASE in the last 168 hours. No results for input(s): AMMONIA in the last 168 hours.  CBC:  Recent Labs Lab 01/31/15 0520 02/01/15 0508 02/03/15 0429 02/04/15 0455 02/05/15 0514   WBC 21.0* 16.1* 19.7* 16.7* 18.0*  HGB 9.5* 8.6* 8.3* 9.1* 9.0*  HCT 29.1* 25.9* 24.7* 26.8* 26.8*  MCV 84.0 84.6 82.6 84.0 83.0  PLT 260 227 243 259 262    Cardiac Enzymes: No results for input(s): CKTOTAL, CKMB, CKMBINDEX, TROPONINI in the last 168 hours.  BNP: Invalid input(s): POCBNP  CBG:  Recent Labs Lab 02/06/15 0528 02/06/15 0615 02/06/15 0714 02/06/15 0849 02/06/15 0904  GLUCAP 178* 151* 159* 20 81    Microbiology: Results for orders placed or performed during the hospital encounter of 02/23/2015  Culture, blood (routine x 2)     Status: None   Collection Time: 02/20/2015  3:34 AM  Result Value Ref Range Status   Specimen Description BLOOD BLOOD RIGHT FOREARM  Final   Special Requests BOTTLES DRAWN AEROBIC AND ANAEROBIC 5ML  Final   Culture  Setup Time   Final    GRAM POSITIVE RODS IN BOTH AEROBIC AND ANAEROBIC BOTTLES CRITICAL RESULT CALLED TO, READ BACK BY AND VERIFIED WITH: BRITTANY RUDD ON 02/13/2015 AT 1752 BY TB CONFIRMED BY TB/MS.    Culture   Final    BACILLUS SPECIES IN BOTH AEROBIC AND ANAEROBIC BOTTLES Results consistent with contamination.    Report Status 02/03/2015 FINAL  Final  Culture, blood (routine x 2)     Status: None   Collection Time: 02/01/2015  3:34 AM  Result Value Ref Range Status   Specimen Description BLOOD RIGHT HAND  Final   Special Requests BOTTLES DRAWN AEROBIC AND ANAEROBIC 5ML  Final   Culture NO GROWTH 5 DAYS  Final   Report Status 02/04/2015 FINAL  Final  MRSA PCR Screening     Status: None   Collection Time: 02/22/2015  5:15 AM  Result Value Ref Range Status   MRSA by PCR NEGATIVE NEGATIVE Final    Comment:        The GeneXpert MRSA Assay (FDA approved for NASAL specimens only), is one component of a comprehensive MRSA colonization surveillance program. It is not intended to diagnose MRSA infection nor to guide or monitor treatment for MRSA infections.   C difficile quick scan w PCR reflex     Status: Abnormal    Collection Time: 02/02/15  4:12 PM  Result Value Ref Range Status   C Diff antigen POSITIVE (A) NEGATIVE Final   C Diff toxin POSITIVE (A) NEGATIVE Final   C Diff interpretation   Final    Positive for toxigenic C. difficile, active toxin production present.    Comment: CRITICAL RESULT CALLED TO, READ BACK BY AND VERIFIED WITH: CHRISTINA MILES ON 02/02/15 AT 1715 BY [facility]     Coagulation Studies: No results for input(s): LABPROT, INR in the last 72 hours.  Urinalysis: No results for input(s): COLORURINE, LABSPEC, PHURINE, GLUCOSEU, HGBUR, BILIRUBINUR, KETONESUR, PROTEINUR, UROBILINOGEN, NITRITE, LEUKOCYTESUR in the last 72 hours.  Invalid input(s): APPERANCEUR    Imaging: US Renal  02/05/2015   CLINICAL DATA:  Acute renal failure  EXAM: RENAL / URINARY TRACT ULTRASOUND COMPLETE  COMPARISON:  None.  FINDINGS: Right Kidney:  Length: 8.4 cm. Echogenicity within normal limits. No mass or hydronephrosis visualized.  Left Kidney:  Length: 9.1 cm. Echogenicity within normal limits. No mass or hydronephrosis visualized.  Bladder:  Appears normal for degree of bladder distention.  Note is made of mild ascites.  IMPRESSION: Mild ascites.  Normal-appearing kidneys.   Electronically Signed   By: Inez Catalina M.D.   On: 02/05/2015 12:36     Medications:   . dextrose 5 % and 0.9% NaCl 50 mL/hr at 02/05/15 1633  . insulin (NOVOLIN-R) infusion 3 mL/hr at 02/06/15 0700  . norepinephrine 2 mcg/min (02/05/15 1110)   . antiseptic oral rinse  7 mL Mouth Rinse BID  . calcium gluconate  1 g Intravenous Once  . enoxaparin (LOVENOX) injection  40 mg Subcutaneous Q24H  . levothyroxine  100 mcg Oral QAC breakfast  . magnesium sulfate 1 - 4 g bolus IVPB  2 g Intravenous Once  . metronidazole  500 mg Intravenous Q8H  . ofloxacin  1 drop Both Eyes QID  . potassium chloride  10 mEq Intravenous Q1 Hr x 2  . sodium bicarbonate  650 mg Oral TID  . sodium chloride  3 mL Intravenous Q12H   acetaminophen **OR**  acetaminophen, ipratropium-albuterol, ondansetron **OR** ondansetron (ZOFRAN) IV, sodium chloride, sodium chloride  Assessment/ Plan:  [name] is a 69 y.o. white female with insulin dependent diabetes mellitus, hypertension, hyperlipidemia, hypothyroidism, seizure disorder, anemia who was admitted to [facility] on 02/22/2015 for DKA and hospitalization complicated by c. Diff colitis.   1. Acute Renal Failure on chronic kidney disease stage III: baseline creatinine from 01/22/15 shows a eGFR of 57. However with multiple episodes of acute renal injury.  Today, renal function improved with eGFR of 52 Chronic kidney disease secondary to diabetic nephropathy and hypertension. Hematuria  on urinalysis on admission. No proteinuria Acute renal failure from Diabetic ketoacidosis, infective colitis and hypovolemia. Creatinine and urine output improving.  - Continue IV fluids: D5NS - Pending renal ultrasound - Patient is not on ACE-I/ARB as outpatient. Will need to start prior to discharge  2. Metabolic Acidosis: with anion gap. Lactic acid at goal. Most likely due to diabetic ketoacidosis compounded with acute renal failure and diarrhea. Diarrhea has now improved.  - Agree with closing anion gap with insulin and dextrose - Continue oral sodium bicarbonate  3. Anemia: hemoglobin of 9. With iron deficiency.  - continue to monitor CBC - recommend iron supplements.   4. 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 Insulin pump scheduled.    LOS: [place], Janiyla Long 9/8/20169:49 AM

## 2015-02-06 NOTE — Progress Notes (Signed)
Informed by nursing staff patient hypotensive with map 57, patient previously on pressor therapy-Levophed via right IJ central line, given she has essential line we'll check CVP and within therapeutic range 8-11 may require pressor therapy again versus if low will continue hydration with IV fluid and monitor response.

## 2015-02-06 NOTE — Progress Notes (Addendum)
Nutrition Follow-up  DOCUMENTATION CODES:   Severe malnutrition in context of acute illness/injury  INTERVENTION:   Coordination of Care: discussed possible diet advancement with [name] SLP, pt does not like current meal options, on Dysphagia II, requesting caesar salad. SLP planning to advance diet. Also discussed re-weighing pt as weight today does not follow weight trend Meals and Snacks: Cater to patient preferences; obtained meal orders for lunch and dinner today. Discussed pt food preferences and offered different options based on available food items from dining services. Pt requesting sugar free items on meal trays, diabetic diet. Pt reports all she wants for breakfast is the Sugar Free [employer] and Fruit. Orders placed. Also discussed smaller, more frequent meals with pt; pt reports this is what she is supposed to be doing. Snacks ordered for between meals. Discussed with [employer] Therapy: continue Sugar Free Carn[employer] Instant Breakfast Nutrition related medication management: pt admitted with thrush, still reports mouth is sore; discussed medication adjustments during ICU rounds  NUTRITION DIAGNOSIS:   Inadequate oral intake related to acute illness as evidenced by NPO status, other (see comment) (thrush in mouth). Continues but being addressed as upgrading diet, continuing supplement, starting snacks between meals, catering to pt preferences  GOAL:   Patient will meet greater than or equal to 90% of their needs   MONITOR:    (Energy intake, Glucose profile, Electrolyte and renal profile)  ASSESSMENT:    Pt being transitioned off insulin drip to insulin pump today, pt off levophed at present, alert on visit at breakfast today   Diet Order:  DIET DYS 2 Room service appropriate?: Yes with Assist; Fluid consistency:: Thin   Energy Intake: no recorded po intake; pt drank milk at breakfast and 1 bite of banana. Pt reports she is  going to drink her [employer] but has not done so at present. Pt reports appetite is fair, reports she needs smaller portions more frequently. Pt does not like the food she is receiving. Per report, intake has been poor; pt request fruit and jello at lunch yesterday but did not eat much of this.   Digestive System: pt reports no N/V, diarrhea improving, Cdiff positive; pt continues to complain of sore mouth (admitted with thrush)  Electrolyte and Renal Profile:  Recent Labs Lab 02/04/15 0455 02/05/15 0514 02/06/15 0427  BUN CREATININE 1.22* 1.05* 1.08*  NA 134* 133* 134*  K 4.1 3.3* 3.4*  MG 1.6* 1.8 1.6*  PHOS 2.3* 2.6 2.5   Glucose Profile:  Recent Labs  02/06/15 0904 02/06/15 0955 02/06/15 1106  GLUCAP 81 114* 153*   Protein Profile: No results for input(s): ALBUMIN in the last 168 hours. Nutritional Anemia Profile:  CBC Latest Ref Rng 02/05/2015 02/04/2015 02/03/2015  WBC 3.6 - 11.0 K/uL 18.0(H) 16.7(H) 19.7(H)  Hemoglobin 12.0 - 16.0 g/dL 9.0(L) 9.1(L) 8.3(L)  Hematocrit 35.0 - 47.0 % 26.8(L) 26.8(L) 24.7(L)  Platelets 150 - 440 K/uL 262 259 243    Meds: D5-NS at 50 ml/hr, insulin drip, flagyl, KCl, sodium bicarb tablet, antiseptic oral rinse  Height:   Ht Readings from Last 1 Encounters:  02/14/2015  (1.549 m)    Weight:   Wt Readings from Last 1 Encounters:  02/06/15 145 lb 11.6 oz (66.1 kg)    Filed Weights   02/05/2015 0210 02/02/2015 0500 02/06/15 0539  Weight: 117 lb (53.071 kg) 119 lb 11.4 oz (54.3 kg) 145 lb 11.6 oz (66.1 kg)  BMI:  Body mass index is 27.55 kg/(m^2).  Estimated Nutritional Needs:   Kcal:  BEE 1012 kcals (IF 1.0-1.2, AF 1.3) 1447-1710 kcals/d.   Protein:  (1.0-1.2 g/kg) 54-65 g/d  Fluid:  (30-45ml/kg) 1620-1861ml/d  EDUCATION NEEDS:   No education needs identified at this time  HIGH Care Level  Romelle Starcher MS, RD, LDN [phone] Pager

## 2015-02-06 NOTE — Progress Notes (Signed)
Spoke to Dr. Lolly Mustache- ordered to turn off insulin drip at 11- patient's insulin pump started at 10.  Order to continue q1 hr blood sugar until 1pm then she will place orders to check at meals and then before bedtime.  Updated Dr. Luberta Mutter and Dr. Belia Heman- patient anxious - not eating well- dislikes what kind of food that is brought up on the tray.  Levophed off since 7am.  Vitals stable.

## 2015-02-07 LAB — PHOSPHORUS: Phosphorus: 2.5 mg/dL (ref 2.5–4.6)

## 2015-02-07 LAB — BASIC METABOLIC PANEL
ANION GAP: 3 — AB (ref 5–15)
BUN: 15 mg/dL (ref 6–20)
CO2: 20 mmol/L — ABNORMAL LOW (ref 22–32)
Calcium: 7.4 mg/dL — ABNORMAL LOW (ref 8.9–10.3)
Chloride: 111 mmol/L (ref 101–111)
Creatinine, Ser: 0.79 mg/dL (ref 0.44–1.00)
Glucose, Bld: 229 mg/dL — ABNORMAL HIGH (ref 65–99)
POTASSIUM: 3.3 mmol/L — AB (ref 3.5–5.1)
SODIUM: 134 mmol/L — AB (ref 135–145)

## 2015-02-07 LAB — GLUCOSE, CAPILLARY
GLUCOSE-CAPILLARY: 205 mg/dL — AB (ref 65–99)
GLUCOSE-CAPILLARY: 193 mg/dL — AB (ref 65–99)
Glucose-Capillary: 199 mg/dL — ABNORMAL HIGH (ref 65–99)
Glucose-Capillary: 198 mg/dL — ABNORMAL HIGH (ref 65–99)

## 2015-02-07 LAB — MAGNESIUM: MAGNESIUM: 1.9 mg/dL (ref 1.7–2.4)

## 2015-02-07 MED ORDER — INSULIN ASPART 100 UNIT/ML ~~LOC~~ SOLN
0.0000 [IU] | Freq: Three times a day (TID) | SUBCUTANEOUS | Status: DC
  Administered 2015-02-11: 1 [IU] via SUBCUTANEOUS
  Filled 2015-02-07: qty 1

## 2015-02-07 MED ORDER — SODIUM CHLORIDE 0.9 % IV SOLN
2.0000 g | Freq: Once | INTRAVENOUS | Status: AC
  Administered 2015-02-07: 2 g via INTRAVENOUS
  Filled 2015-02-07: qty 20

## 2015-02-07 MED ORDER — CALCIUM CARBONATE ANTACID 500 MG PO CHEW
200.0000 mg | CHEWABLE_TABLET | Freq: Two times a day (BID) | ORAL | Status: DC
  Administered 2015-02-08 – 2015-02-15 (×15): 200 mg via ORAL
  Filled 2015-02-07 (×11): qty 1

## 2015-02-07 MED ORDER — INSULIN ASPART 100 UNIT/ML ~~LOC~~ SOLN
0.0000 [IU] | Freq: Three times a day (TID) | SUBCUTANEOUS | Status: DC
  Administered 2015-02-08 (×2): 1 [IU] via SUBCUTANEOUS
  Filled 2015-02-07 (×2): qty 1

## 2015-02-07 MED ORDER — ALPRAZOLAM 0.25 MG PO TABS: 0.2500 mg | ORAL_TABLET | Freq: Three times a day (TID) | ORAL | Status: DC | PRN

## 2015-02-07 MED ORDER — POTASSIUM CHLORIDE 10 MEQ/100ML IV SOLN
10.0000 meq | INTRAVENOUS | Status: AC
  Administered 2015-02-07 (×4): 10 meq via INTRAVENOUS
  Filled 2015-02-07 (×4): qty 100

## 2015-02-07 MED ORDER — CALCIUM CARBONATE ANTACID 500 MG PO CHEW
1.0000 | CHEWABLE_TABLET | ORAL | Status: DC | PRN
  Administered 2015-02-07 – 2015-02-13 (×4): 200 mg via ORAL
  Filled 2015-02-07 (×7): qty 1

## 2015-02-07 MED ORDER — INSULIN ASPART 100 UNIT/ML ~~LOC~~ SOLN: 0.0000 [IU] | Freq: Every day | SUBCUTANEOUS | Status: DC

## 2015-02-07 MED ORDER — CALCIUM CITRATE 950 (200 CA) MG PO TABS
200.0000 mg | ORAL_TABLET | Freq: Two times a day (BID) | ORAL | Status: DC
Start: 2015-02-07 — End: 2015-02-07
  Filled 2015-02-07 (×2): qty 1

## 2015-02-07 MED ORDER — INSULIN PUMP
Freq: Three times a day (TID) | SUBCUTANEOUS | Status: DC
  Administered 2015-02-07 (×2): via SUBCUTANEOUS
  Filled 2015-02-07: qty 1

## 2015-02-07 MED ORDER — INSULIN GLARGINE 100 UNIT/ML ~~LOC~~ SOLN
6.0000 [IU] | Freq: Every day | SUBCUTANEOUS | Status: DC
  Administered 2015-02-07: 6 [IU] via SUBCUTANEOUS
  Filled 2015-02-07 (×2): qty 0.06

## 2015-02-07 NOTE — Progress Notes (Signed)
ENDOCRINOLOGY Follow up  CHIEF COMPLAINT: Type 1 DM on insulin pump  HPI: 69 y.o. female seen in consultation for diabetes mellitus, type 1 on insulin pump therapy admitted with septic shock and DKA, now resolved.   Per RN, [name] was quite anxious yesterday and received a dose of xanax overnight. [name] has been quite groggy this am. [name] is also not eating much and Nutrition is to start calorie counts. [name] reports [name] is eating but did express to me yesterday that [name] does not like the food [name] is receiving. [name] is on IVF D5 NS 50 mL/hr.   CURRENT MEDICATIONS:  . antiseptic oral rinse  7 mL Mouth Rinse BID  . calcium citrate  200 mg of elemental calcium Oral BID  . calcium gluconate  2 g Intravenous Once  . clotrimazole  10 mg Oral 5 X Daily  . enoxaparin (LOVENOX) injection  40 mg Subcutaneous Q24H  . insulin pump   Subcutaneous TID AC, HS, 0200  . levothyroxine  100 mcg Oral QAC breakfast  . metroNIDAZOLE  500 mg Oral 3 times per day  . ofloxacin  1 drop Both Eyes QID  . potassium chloride  10 mEq Intravenous Q1 Hr x 4  . sodium bicarbonate  650 mg Oral TID  . sodium chloride  3 mL Intravenous Q12H    ALLERGIES:  No Known Allergies  PHYSICAL EXAMINATION:  BP 114/55 mmHg  Pulse 73  Temp(Src) 91.6 F (33.1 C) (Rectal)  Resp 21  Ht 5\' 1"  (1.549 m)  Wt 68.3 kg (150 lb 9.2 oz)  BMI 28.47 kg/m2  SpO2 99%  GENERAL: somnolent but arousable, oriented and appropriately answering all questions. PULMONARY: breathing unlabored on room air.  ABDOMEN:  Diffusely soft, nontender, nondistended, insulin pump infusion site in RLQ with no surrounding erythema, placed on 02/06/2015   EXTREMITIES:  No peripheral edema is present.    SKIN:  Warm, dry.   LABORATORY DATA:  01/17/15 -- Hgb A1c 10.7%  Results for orders placed or performed during the hospital encounter of 02/23/2015 (from the past 24 hour(s))  Glucose, capillary     Status: Abnormal   Collection Time: 02/06/15  9:55 AM  Result  Value Ref Range   Glucose-Capillary 114 (H) 65 - 99 mg/dL  Glucose, capillary     Status: Abnormal   Collection Time: 02/06/15 11:06 AM  Result Value Ref Range   Glucose-Capillary 153 (H) 65 - 99 mg/dL  Glucose, capillary     Status: Abnormal   Collection Time: 02/06/15 12:08 PM  Result Value Ref Range   Glucose-Capillary 164 (H) 65 - 99 mg/dL  Glucose, capillary     Status: Abnormal   Collection Time: 02/06/15  2:04 PM  Result Value Ref Range   Glucose-Capillary 181 (H) 65 - 99 mg/dL  Glucose, capillary     Status: Abnormal   Collection Time: 02/06/15  5:46 PM  Result Value Ref Range   Glucose-Capillary 210 (H) 65 - 99 mg/dL  Glucose, capillary     Status: Abnormal   Collection Time: 02/06/15  8:54 PM  Result Value Ref Range   Glucose-Capillary 205 (H) 65 - 99 mg/dL  Glucose, capillary     Status: Abnormal   Collection Time: 02/07/15  2:50 AM  Result Value Ref Range   Glucose-Capillary 199 (H) 65 - 99 mg/dL  Basic metabolic panel     Status: Abnormal   Collection Time: 02/07/15  4:50 AM  Result Value Ref Range  Sodium 134 (L) 135 - 145 mmol/L   Potassium 3.3 (L) 3.5 - 5.1 mmol/L   Chloride 111 101 - 111 mmol/L   CO2 20 (L) 22 - 32 mmol/L   Glucose, Bld 229 (H) 65 - 99 mg/dL   BUN 15 6 - 20 mg/dL   Creatinine, Ser 1.61 0.44 - 1.00 mg/dL   Calcium 7.4 (L) 8.9 - 10.3 mg/dL   GFR calc non Af Amer >60 >60 mL/min   GFR calc Af Amer >60 >60 mL/min   Anion gap 3 (L) 5 - 15  Magnesium     Status: None   Collection Time: 02/07/15  4:50 AM  Result Value Ref Range   Magnesium 1.9 1.7 - 2.4 mg/dL  Phosphorus     Status: None   Collection Time: 02/07/15  4:50 AM  Result Value Ref Range   Phosphorus 2.5 2.5 - 4.6 mg/dL  Glucose, capillary     Status: Abnormal   Collection Time: 02/07/15  6:55 AM  Result Value Ref Range   Glucose-Capillary 205 (H) 65 - 99 mg/dL  Glucose, capillary     Status: Abnormal   Collection Time: 02/07/15  8:19 AM  Result Value Ref Range    Glucose-Capillary 198 (H) 65 - 99 mg/dL    ASSESSMENT:  1. DKA - resolved 2. Steroid-induced hyperglycemia - on insulin pump 3. Acute renal failure due to dehydration, improving 4. Septic shock, resolved 5. Hypothyroidism, stable   PLAN: 1. Mental status seems to be improving. If [name] remains somnolent into the afternoon, asked her RN to notify me. Would hold off on giving her benzodiazepines or narcotics if possible.  Continue subQ insulin via pump. [name] is somnolent this am but arousable and oriented x 3. Review of her pump reveals [name] is bolusing for correction appropriately entering BG readings. However, [name] has not been eating and is therefore not bolusing for meals. Which is also appropriate. [name] needs additional basal insulin coverage during the day. The adjustments below were made this am:   Basal rates: 0000 0.30 units/hr 0800 0.30 units/hr --> 0.35 u/hr 2200 0.25 units/hr  Bolus settings Insulin:carb ratio 1:21 Sensitivity: 100 --> 90 BG target: 140 Insulin on board: 5 hours  2. Advised RN Pam to check BG q4hrs if [name] is not eating. While eating AC/HS. 3. Continue levothyroxine 100 mcg daily.  Will follow along with you. Please page with any concerns.   Doylene Canning, MD [facility]

## 2015-02-07 NOTE — Progress Notes (Signed)
Pt is very lethargic and drowsy after Xanax1mg  at 3am.  She is NPO until she fully awakens.  Dr Felicity Coyer (endocrinology) called and aware of pt blood sugar, inability to regulate her insulin pump at present. Hopefully, she will become more alert  throughout day.

## 2015-02-07 NOTE — Progress Notes (Signed)
[facility]  ROUNDING NOTE   Subjective:   Placed on warming blanket this morning.  Alprazolam given last night. She is very lethargic this morning.  On Insulin pump.   HCO3 20  Calcium gluconate ordered.    Objective:  Vital signs in last 24 hours:  Temp:  [91.6 F (33.1 C)-97.7 F (36.5 C)] 91.6 F (33.1 C) (09/09 0900) Pulse Rate:  [66-90] 73 (09/09 0700) Resp:  [17-37] 21 (09/09 0900) BP: (76-121)/(50-75) 114/55 mmHg (09/09 0900) SpO2:  [95 %-100 %] 99 % (09/09 0900) Weight:  [68.3 kg (150 lb 9.2 oz)-68.7 kg (151 lb 7.3 oz)] 68.3 kg (150 lb 9.2 oz) (09/09 0500)  Weight change: 2.6 kg (5 lb 11.7 oz) Filed Weights   02/06/15 0539 02/06/15 1200 02/07/15 0500  Weight: 66.1 kg (145 lb 11.6 oz) 68.7 kg (151 lb 7.3 oz) 68.3 kg (150 lb 9.2 oz)    Intake/Output: I/O last 3 completed shifts: In: 2488.4 [I.V.:1938.4; IV Piggyback:550] Out: 150 [Urine:150]   Intake/Output this shift:     Physical Exam: General: NAD  Head: Normocephalic, atraumatic. Moist oral mucosal membranes  Eyes: Anicteric, PERRL  Neck: Supple, trachea midline  Lungs:  Clear to auscultation  Heart: Regular rate and rhythm  Abdomen:  Soft, nontender,   Extremities: No peripheral edema.  Neurologic: Nonfocal, moving all four extremities  Skin: No lesions       Basic Metabolic Panel:  Recent Labs Lab 02/03/15 0429 02/04/15 0455 02/05/15 0514 02/06/15 0427 02/07/15 0450  NA 136 134* 133* 134* 134*  K 3.1* 4.1 3.3* 3.4* 3.3*  CL 115* 112* 110 110 111  CO2 16* 13* 18* 16* 20*  GLUCOSE 104* 217* 145* 230* 229*  BUN '11 15 15 16 15  ' CREATININE 1.19* 1.22* 1.05* 1.08* 0.79  CALCIUM 7.5* 7.6* 7.5* 7.7* 7.4*  MG 1.8 1.6* 1.8 1.6* 1.9  PHOS 2.6 2.3* 2.6 2.5 2.5    Liver Function Tests: No results for input(s): AST, ALT, ALKPHOS, BILITOT, PROT, ALBUMIN in the last 168 hours. No results for input(s): LIPASE, AMYLASE in the last 168 hours. No results for input(s): AMMONIA in the  last 168 hours.  CBC:  Recent Labs Lab 02/01/15 0508 02/03/15 0429 02/04/15 0455 02/05/15 0514  WBC 16.1* 19.7* 16.7* 18.0*  HGB 8.6* 8.3* 9.1* 9.0*  HCT 25.9* 24.7* 26.8* 26.8*  MCV 84.6 82.6 84.0 83.0  PLT 227 243 259 262    Cardiac Enzymes: No results for input(s): CKTOTAL, CKMB, CKMBINDEX, TROPONINI in the last 168 hours.  BNP: Invalid input(s): POCBNP  CBG:  Recent Labs Lab 02/06/15 1746 02/06/15 2054 02/07/15 0250 02/07/15 0655 02/07/15 0819  GLUCAP 210* 205* 199* 205* 198*    Microbiology: Results for orders placed or performed during the hospital encounter of 02/14/2015  Culture, blood (routine x 2)     Status: None   Collection Time: 02/28/2015  3:34 AM  Result Value Ref Range Status   Specimen Description BLOOD BLOOD RIGHT FOREARM  Final   Special Requests BOTTLES DRAWN AEROBIC AND ANAEROBIC 5ML  Final   Culture  Setup Time   Final    GRAM POSITIVE RODS IN BOTH AEROBIC AND ANAEROBIC BOTTLES CRITICAL RESULT CALLED TO, READ BACK BY AND VERIFIED WITH: BRITTANY RUDD ON 02/27/2015 AT 1752 BY TB CONFIRMED BY TB/MS.    Culture   Final    BACILLUS SPECIES IN BOTH AEROBIC AND ANAEROBIC BOTTLES Results consistent with contamination.    Report Status 02/03/2015 FINAL  Final  Culture, blood (routine x 2)     Status: None   Collection Time: 02/22/2015  3:34 AM  Result Value Ref Range Status   Specimen Description BLOOD RIGHT HAND  Final   Special Requests BOTTLES DRAWN AEROBIC AND ANAEROBIC 5ML  Final   Culture NO GROWTH 5 DAYS  Final   Report Status 02/04/2015 FINAL  Final  MRSA PCR Screening     Status: None   Collection Time: 02/01/2015  5:15 AM  Result Value Ref Range Status   MRSA by PCR NEGATIVE NEGATIVE Final    Comment:        The GeneXpert MRSA Assay (FDA approved for NASAL specimens only), is one component of a comprehensive MRSA colonization surveillance program. It is not intended to diagnose MRSA infection nor to guide or monitor treatment  for MRSA infections.   C difficile quick scan w PCR reflex     Status: Abnormal   Collection Time: 02/02/15  4:12 PM  Result Value Ref Range Status   C Diff antigen POSITIVE (A) NEGATIVE Final   C Diff toxin POSITIVE (A) NEGATIVE Final   C Diff interpretation   Final    Positive for toxigenic C. difficile, active toxin production present.    Comment: CRITICAL RESULT CALLED TO, READ BACK BY AND VERIFIED WITH: CHRISTINA MILES ON 02/02/15 AT 1715 BY [facility]     Coagulation Studies: No results for input(s): LABPROT, INR in the last 72 hours.  Urinalysis: No results for input(s): COLORURINE, LABSPEC, PHURINE, GLUCOSEU, HGBUR, BILIRUBINUR, KETONESUR, PROTEINUR, UROBILINOGEN, NITRITE, LEUKOCYTESUR in the last 72 hours.  Invalid input(s): APPERANCEUR    Imaging: US Renal  02/05/2015   CLINICAL DATA:  Acute renal failure  EXAM: RENAL / URINARY TRACT ULTRASOUND COMPLETE  COMPARISON:  None.  FINDINGS: Right Kidney:  Length: 8.4 cm. Echogenicity within normal limits. No mass or hydronephrosis visualized.  Left Kidney:  Length: 9.1 cm. Echogenicity within normal limits. No mass or hydronephrosis visualized.  Bladder:  Appears normal for degree of bladder distention.  Note is made of mild ascites.  IMPRESSION: Mild ascites.  Normal-appearing kidneys.   Electronically Signed   By: Inez Catalina M.D.   On: 02/05/2015 12:36     Medications:   . dextrose 5 % and 0.9% NaCl 50 mL/hr at 02/06/15 1400   . antiseptic oral rinse  7 mL Mouth Rinse BID  . calcium gluconate  2 g Intravenous Once  . clotrimazole  10 mg Oral 5 X Daily  . enoxaparin (LOVENOX) injection  40 mg Subcutaneous Q24H  . insulin pump   Subcutaneous TID AC, HS, 0200  . levothyroxine  100 mcg Oral QAC breakfast  . metroNIDAZOLE  500 mg Oral 3 times per day  . ofloxacin  1 drop Both Eyes QID  . potassium chloride  10 mEq Intravenous Q1 Hr x 4  . sodium bicarbonate  650 mg Oral TID  . sodium chloride  3 mL Intravenous Q12H    acetaminophen **OR** acetaminophen, ALPRAZolam, calcium carbonate, ipratropium-albuterol, ondansetron **OR** ondansetron (ZOFRAN) IV, sodium chloride, sodium chloride  Assessment/ Plan:  [name] is a 69 y.o. white female with insulin dependent diabetes mellitus, hypertension, hyperlipidemia, hypothyroidism, seizure disorder, anemia who was admitted to [facility] on 02/23/2015 for DKA and hospitalization complicated by c. Diff colitis.   1. Acute Renal Failure on chronic kidney disease stage III: baseline creatinine from 01/22/15 shows a eGFR of 57. However with multiple episodes of acute renal injury. Now with creatinine within  normal range.  Chronic kidney disease secondary to diabetic nephropathy and hypertension. Hematuria on urinalysis on admission. No proteinuria Acute renal failure from Diabetic ketoacidosis, infective colitis and hypovolemia.  - Continue IV fluids: D5NS - renal ultrasound reviewed.  - Patient is not on ACE-I/ARB as outpatient. Will need to start prior to discharge.  2. Metabolic Acidosis: with anion gap. Lactic acid at goal. Most likely due to diabetic ketoacidosis compounded with acute renal failure and diarrhea. Diarrhea has now improved.  - Agree with closing anion gap with insulin and dextrose - Continue oral sodium bicarbonate - Due to hypocalcemia: will add calcium citrate  3. Anemia: hemoglobin of 9. With iron deficiency.  - continue to monitor CBC - recommend iron supplements.   4. 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 Insulin pump.  5. Hypocalcemia: corrected calcium of  8.1 - calcium gluconate given IV today - Start calcium citrate to help with hypocalcemia and acidosis.    LOS: [place], [place] 9/9/20169:14 AM

## 2015-02-07 NOTE — Plan of Care (Signed)
Problem: Phase I Progression Outcomes Goal: OOB as tolerated unless otherwise ordered Outcome: Not Progressing PT was in to see this afternoon. Pt dangled with their assist but she was not steady enough to get OOB today Goal: Initial discharge plan identified Outcome: Progressing Pt husband hopeful she will get back to [employer] and then return home.  Problem: Phase II Progression Outcomes Goal: CBGs stable on SQ insulin Outcome: Not Progressing CBG's below 250. Insulin pump was DC'd this eve and pt started on Lantus and sliding scale.  She did give herself 1 unit of insulin coverage while on insulin pump at 1700.  She lacks concentration on the task and needed redirection throughout process.  Dr Felicity Coyer was notified and decision was made to remove and send pump home once lantus was given. Goal: Monitor hydration status Outcome: Progressing IVF d5Ns at 50 ml/hr.  Poor po intake. 2+ generalized edema. Incontinent of dark urine in diapers. Goal: Tolerating PO clear liquid diet Outcome: Progressing On carb control diet. Very poor po intake. Calorie count started Goal: Potassium level normalizing Outcome: Progressing Potassium Iv supplements given today for K+3.3 Goal: Nausea & vomiting resolved Outcome: Progressing No c/o N&V

## 2015-02-07 NOTE — Progress Notes (Signed)
Rectal temp 91, applied bair hugger. Day nurse reported pt refusing and pulling bair hugger off MD are aware.

## 2015-02-07 NOTE — Progress Notes (Signed)
Endocrinology note: Patient remains somnolent. Concerned she cannot operate her insulin pump reliably at this point.  Plan: Administer 6 units lantus daily.  Remove insulin pump once lantus is given.  Advised that pump and supplies be given to pt's daughter for safekeeping. Novolog 1 unit for every 20 grams of carb eaten three times daily.  Modified correction scale beginning at BG 150. Glucometer checks AC/HS and 3 am. Please page with any questions or concerns.  Doylene Canning, MD [facility] Endocrinology

## 2015-02-07 NOTE — Progress Notes (Signed)
Pt complained of having gas pains.  Prime doc, Dr. Clint Guy was present.  Dr. Clint Guy placed an order for tums.  Pt reported relief.

## 2015-02-07 NOTE — Progress Notes (Signed)
[facility] [place] [facility]                                                                                                                                                                                            Patient Demographics   [name], is a 69 y.o. female, DOB - [birth year], WUJ:[id number]  Admit date - 01/31/2015   Admitting Physician Oralia Manis, MD  Outpatient Primary MD for the patient is Lauro Regulus., MD   LOS - 8  Subjective: Patient was recently hospitalized with nausea vomiting and DKA. He was discharged to rehabilitation. Patient at the skilled nursing facility was noted to have hypotension, was on insulin drip, Levophed. He came off insulin drip yesterday, switched  insulin pump. . Last night she was agitated Xanax 15 a.m. this morning she was lethargic. She also has hypothermia temperature was 91.5 right now has a Advice worker.  Review of Systems:   Limited due to patient being lethargic   Vitals:   Filed Vitals:   02/07/15 0805 02/07/15 0900 02/07/15 1000 02/07/15 1100  BP:  114/55 112/59 119/69  Pulse:   71 75  Temp: 91.6 F (33.1 C) 91.6 F (33.1 C) 91.9 F (33.3 C) 92.3 F (33.5 C)  TempSrc: Rectal Rectal    Resp:  21 22 22   Height:      Weight:      SpO2:  99% 100% 100%    Wt Readings from Last 3 Encounters:  02/07/15 68.3 kg (150 lb 9.2 oz)  01/22/15 56.8 kg (125 lb 3.5 oz)  01/17/15 53.071 kg (117 lb)     Intake/Output Summary (Last 24 hours) at 02/07/15 1203 Last data filed at 02/07/15 1107  Gross per 24 hour  Intake   1820 ml  Output      0 ml  Net   1820 ml    Physical Exam:   GENERAL lethargic HEAD, EYES, EARS, NOSE AND THROAT: Atraumatic, normocephalic. Extraocular muscles are intact. Pupils equal and reactive to light. Sclerae anicteric. No conjunctival injection. No oro-pharyngeal erythema.  NECK: Supple. There is no jugular venous distention. No bruits, no lymphadenopathy, no  thyromegaly.  HEART: Regular rate and rhythm,. No murmurs, no rubs, no clicks.  LUNGS: Bilateral expiratory wheeze in all lung fields. ABDOMEN: Soft, flat, nontender, nondistended. Has good bowel sounds. No hepatosplenomegaly appreciated.  EXTREMITIES: No evidence of any cyanosis, clubbing, or peripheral edema.  +2 pedal and radial pulses bilaterally.  NEUROLOGIC: Patient is sleepy but able to move all extremities no focal deficit appreciated SKIN: Moist and warm with no rashes appreciated.  Psych: Not anxious,  depressed LN: No inguinal LN enlargement    Antibiotics   Anti-infectives    Start     Dose/Rate Route Frequency Ordered Stop   02/06/15 1400  metroNIDAZOLE (FLAGYL) tablet 500 mg     500 mg Oral 3 times per day 02/06/15 1051     02/02/15 1815  metroNIDAZOLE (FLAGYL) IVPB 500 mg  Status:  Discontinued     500 mg 100 mL/hr over 60 Minutes Intravenous Every 8 hours 02/02/15 1804 02/06/15 1051   01/31/15 1600  vancomycin (VANCOCIN) IVPB 750 mg/150 ml premix  Status:  Discontinued     750 mg 150 mL/hr over 60 Minutes Intravenous Every 36 hours 02/19/2015 0516 02/10/2015 1228   01/31/15 0400  vancomycin (VANCOCIN) IVPB 750 mg/150 ml premix  Status:  Discontinued     750 mg 150 mL/hr over 60 Minutes Intravenous Every 24 hours 02/17/2015 1228 02/05/15 1148   01/31/2015 0600  piperacillin-tazobactam (ZOSYN) IVPB 3.375 g  Status:  Discontinued     3.375 g 12.5 mL/hr over 240 Minutes Intravenous 3 times per day 02/17/2015 0516 02/05/15 1148   02/18/2015 0345  vancomycin (VANCOCIN) IVPB 1000 mg/200 mL premix     1,000 mg 200 mL/hr over 60 Minutes Intravenous  Once 02/25/2015 0331 02/18/2015 0506   02/08/2015 0345  piperacillin-tazobactam (ZOSYN) IVPB 3.375 g  Status:  Discontinued     3.375 g 12.5 mL/hr over 240 Minutes Intravenous  Once 02/25/2015 0331 02/05/2015 0745      Medications   Scheduled Meds: . antiseptic oral rinse  7 mL Mouth Rinse BID  . calcium citrate  200 mg of elemental calcium Oral  BID  . clotrimazole  10 mg Oral 5 X Daily  . enoxaparin (LOVENOX) injection  40 mg Subcutaneous Q24H  . insulin pump   Subcutaneous TID AC, HS, 0200  . levothyroxine  100 mcg Oral QAC breakfast  . metroNIDAZOLE  500 mg Oral 3 times per day  . ofloxacin  1 drop Both Eyes QID  . potassium chloride  10 mEq Intravenous Q1 Hr x 4  . sodium bicarbonate  650 mg Oral TID  . sodium chloride  3 mL Intravenous Q12H   Continuous Infusions: . dextrose 5 % and 0.9% NaCl 50 mL/hr at 02/07/15 0920   PRN Meds:.acetaminophen **OR** acetaminophen, ALPRAZolam, calcium carbonate, ipratropium-albuterol, ondansetron **OR** ondansetron (ZOFRAN) IV, sodium chloride, sodium chloride   Data Review:   Micro Results Recent Results (from the past 240 hour(s))  Culture, blood (routine x 2)     Status: None   Collection Time: 02/25/2015  3:34 AM  Result Value Ref Range Status   Specimen Description BLOOD BLOOD RIGHT FOREARM  Final   Special Requests BOTTLES DRAWN AEROBIC AND ANAEROBIC  Final   Culture  Setup Time   Final    GRAM POSITIVE RODS IN BOTH AEROBIC AND ANAEROBIC BOTTLES CRITICAL RESULT CALLED TO, READ BACK BY AND VERIFIED WITH: BRITTANY RUDD ON 02/05/2015 AT 1752 BY TB CONFIRMED BY TB/MS.    Culture   Final    BACILLUS SPECIES IN BOTH AEROBIC AND ANAEROBIC BOTTLES Results consistent with contamination.    Report Status 02/03/2015 FINAL  Final  Culture, blood (routine x 2)     Status: None   Collection Time: 02/10/2015  3:34 AM  Result Value Ref Range Status   Specimen Description BLOOD RIGHT HAND  Final   Special Requests BOTTLES DRAWN AEROBIC AND ANAEROBIC  Final   Culture NO GROWTH 5 DAYS  Final  Report Status 02/04/2015 FINAL  Final  MRSA PCR Screening     Status: None   Collection Time: 02-26-15  5:15 AM  Result Value Ref Range Status   MRSA by PCR NEGATIVE NEGATIVE Final    Comment:        The GeneXpert MRSA Assay (FDA approved for NASAL specimens only), is one component of  a comprehensive MRSA colonization surveillance program. It is not intended to diagnose MRSA infection nor to guide or monitor treatment for MRSA infections.   C difficile quick scan w PCR reflex     Status: Abnormal   Collection Time: 02/02/15  4:12 PM  Result Value Ref Range Status   C Diff antigen POSITIVE (A) NEGATIVE Final   C Diff toxin POSITIVE (A) NEGATIVE Final   C Diff interpretation   Final    Positive for toxigenic C. difficile, active toxin production present.    Comment: CRITICAL RESULT CALLED TO, READ BACK BY AND VERIFIED WITH: CHRISTINA MILES ON 02/02/15 AT 1715 BY [facility]     Radiology Reports Dg Chest 1 View  02/01/2015   CLINICAL DATA:  69 year old female admitted to the hospital on 02/26/2015 for hypotension. New onset of wheezing.  EXAM: CHEST  1 VIEW  COMPARISON:  Chest x-ray 01/31/2015.  FINDINGS: There is a right-sided internal jugular central venous catheter with tip terminating in the distal superior vena cava. Lung volumes are low. Patchy multifocal asymmetrically distributed interstitial and airspace disease, most evident throughout the mid to lower lungs bilaterally (left greater than right). Small bilateral pleural effusions (left greater than right). Pulmonary vasculature does not appear engorged. Heart size left is normal. The patient is rotated to the left on today's exam, resulting in distortion of the mediastinal contours and reduced diagnostic sensitivity and specificity for mediastinal pathology. Atherosclerosis in the thoracic aorta.  IMPRESSION: 1. Interval development of asymmetrically distributed patchy multifocal interstitial and airspace disease in the lungs bilaterally (left greater than right), concerning for multilobar pneumonia. 2. Small bilateral pleural effusions (left greater than right). 3. Atherosclerosis.   Electronically Signed   By: Trudie Reed M.D.   On: 02/01/2015 11:45   Dg Chest 2 View  01/19/2015   CLINICAL DATA:  Status post left  fourth through sixth rib fractures 01/17/2015 after a fall. Vomiting and diarrhea today. Subsequent encounter.  EXAM: CHEST  2 VIEW  COMPARISON:  Plain films of the chest and left ribs 01/17/2015. PA and lateral chest 05/15/2013. CT chest 01/06/2010.  FINDINGS: Left fourth through seventh rib fractures are noted. No new fracture is identified. The lungs are clear. No pneumothorax or pleural effusion is identified. Heart size is normal. Aortic atherosclerosis is noted.  IMPRESSION: Left fourth through seventh rib fractures. Negative for pneumothorax or acute cardiopulmonary disease.   Electronically Signed   By: Drusilla Kanner M.D.   On: 01/19/2015 14:14   Dg Ribs Unilateral W/chest Left  01/17/2015   CLINICAL DATA:  Status post fall today complaining of left posterior rib pain.  EXAM: LEFT RIBS AND CHEST - 3+ VIEW  COMPARISON:  May 15, 2013  FINDINGS: There are displaced fractures of the left fourth, fifth, sixth, seventh ribs. There questioned fractures of the left second and third ribs. There is no pneumothorax. The lungs are clear. The mediastinal contour and cardiac silhouette are normal.  IMPRESSION: Fractures of left ribs as described.   Electronically Signed   By: Sherian Rein M.D.   On: 01/17/2015 16:53   Dg Thoracic Spine 2 View  01/17/2015   CLINICAL DATA:  Patient status post fall while walking up the stairs. Left posterior rib and thoracic spine pain worse when breathing.  EXAM: THORACIC SPINE 2 VIEWS  COMPARISON:  Chest radiograph 05/15/2013  FINDINGS: Superior thoracic vertebral bodies are not well visualized. The mid and lower thoracic vertebral bodies are visualized and appear in normal height with normal intervertebral disc spaces. There is rightward curvature of the proximal thoracic spine demonstrated on the AP view. Multiple left-sided rib fractures, incompletely evaluated on current exam.  IMPRESSION: Rightward curvature of the proximal thoracic spine. Recommend correlation for  point tenderness at this location.  Otherwise relative preservation the vertebral body heights.  Incompletely visualized probable left rib fractures. See dedicated chest and rib report.   Electronically Signed   By: Annia Belt M.D.   On: 01/17/2015 16:52   Ct Head Wo Contrast  01/31/2015   CLINICAL DATA:  Lethargy and decreased responsiveness today.  EXAM: CT HEAD WITHOUT CONTRAST  TECHNIQUE: Contiguous axial images were obtained from the base of the skull through the vertex without intravenous contrast.  COMPARISON:  CT head without contrast 01/19/2015. MRI brain 08/15/2009.  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 No acute infarct, hemorrhage, or mass lesion is otherwise present. The ventricles are of normal size. No significant extra-axial fluid collection is present. The basal ganglia are intact. The insular ribbon is intact.  The paranasal sinuses and mastoid air cells are clear.  IMPRESSION: 1. No acute intracranial abnormality or significant interval change. 2. 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   Electronically Signed   By: Marin Roberts M.D.   On: 01/31/2015 16:26   Ct Head Wo Contrast  01/19/2015   CLINICAL DATA:  Intractable vomiting over the last few days.  EXAM: CT HEAD WITHOUT CONTRAST  TECHNIQUE: Contiguous axial images were obtained from the base of the skull through the vertex without intravenous contrast.  COMPARISON:  Head CT 01/17/2015  FINDINGS: Brainstem is normal. Chronically known 1 cm lesion of the left cerebellum is again visible, consistent with a chronic cavernous angioma. Some adjacent brain low density is present as has been seen previously. No sign that there is any active process in this region. The cerebral hemispheres are normal. No evidence of old or acute infarction, mass lesion, hemorrhage, hydrocephalus or extra-axial collection. Chronic benign left calvarial lucency  likely related to a hemangioma. No acute calvarial finding. Sinuses, middle ears and mastoids are clear.  IMPRESSION: No acute intracranial finding. 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 Based on the unchanged appearance, this would be on likely to relate to the clinical presentation.   Electronically Signed   By: Paulina Fusi M.D.   On: 01/19/2015 13:31   Ct Head Wo Contrast  01/17/2015   CLINICAL DATA:  Larey Seat downstairs today.  Hit head.  EXAM: CT HEAD WITHOUT CONTRAST  CT CERVICAL SPINE WITHOUT CONTRAST  TECHNIQUE: Multidetector CT imaging of the head and cervical spine was performed following the standard protocol without intravenous contrast. Multiplanar CT image reconstructions of the cervical spine were also generated.  COMPARISON:  Head CT 08/14/2009  FINDINGS: CT HEAD FINDINGS  The ventricles are normal in size and configuration. No extra-axial fluid collections are identified. The gray-white differentiation is normal. No CT findings for acute intracranial process such as hemorrhage or infarction. No mass lesions. The brainstem and cerebellum are grossly normal.  The bony structures are  intact. The paranasal sinuses and mastoid air cells are clear. The globes are intact.  CT CERVICAL SPINE FINDINGS  Degenerative cervical spondylosis with mild multilevel disc disease and facet disease. The cervical vertebral bodies are normally aligned. No acute fracture. The facets are normally aligned. Moderate C1-2 degenerative changes but no dens fracture. The spinal canal is quite generous. No spinal or significant foraminal stenosis. The lung apices are clear.  IMPRESSION: 1. No acute intracranial findings or skull fracture. 2. Mild degenerative cervical spondylosis but no acute cervical spine fracture.   Electronically Signed   By: Rudie Meyer M.D.   On: 01/17/2015 15:21   Ct Cervical Spine Wo Contrast  01/17/2015   CLINICAL DATA:  Larey Seat downstairs today.  Hit head.   EXAM: CT HEAD WITHOUT CONTRAST  CT CERVICAL SPINE WITHOUT CONTRAST  TECHNIQUE: Multidetector CT imaging of the head and cervical spine was performed following the standard protocol without intravenous contrast. Multiplanar CT image reconstructions of the cervical spine were also generated.  COMPARISON:  Head CT 08/14/2009  FINDINGS: CT HEAD FINDINGS  The ventricles are normal in size and configuration. No extra-axial fluid collections are identified. The gray-white differentiation is normal. No CT findings for acute intracranial process such as hemorrhage or infarction. No mass lesions. The brainstem and cerebellum are grossly normal.  The bony structures are intact. The paranasal sinuses and mastoid air cells are clear. The globes are intact.  CT CERVICAL SPINE FINDINGS  Degenerative cervical spondylosis with mild multilevel disc disease and facet disease. The cervical vertebral bodies are normally aligned. No acute fracture. The facets are normally aligned. Moderate C1-2 degenerative changes but no dens fracture. The spinal canal is quite generous. No spinal or significant foraminal stenosis. The lung apices are clear.  IMPRESSION: 1. No acute intracranial findings or skull fracture. 2. Mild degenerative cervical spondylosis but no acute cervical spine fracture.   Electronically Signed   By: Rudie Meyer M.D.   On: 01/17/2015 15:21   US Renal  02/05/2015   CLINICAL DATA:  Acute renal failure  EXAM: RENAL / URINARY TRACT ULTRASOUND COMPLETE  COMPARISON:  None.  FINDINGS: Right Kidney:  Length: 8.4 cm. Echogenicity within normal limits. No mass or hydronephrosis visualized.  Left Kidney:  Length: 9.1 cm. Echogenicity within normal limits. No mass or hydronephrosis visualized.  Bladder:  Appears normal for degree of bladder distention.  Note is made of mild ascites.  IMPRESSION: Mild ascites.  Normal-appearing kidneys.   Electronically Signed   By: Alcide Clever M.D.   On: 02/05/2015 12:36   Dg Chest Port 1  View  01/31/2015   Ricarda Frame, MD     01/31/2015  1:33 AM Central Venous Catheter Insertion Procedure Note JENNYFER NICKOLSON [id number] [birth year]  Procedure: Insertion of Central Venous Catheter Indications: Drug and/or fluid administration  Procedure Details Consent: Risks of procedure as well as the alternatives and risks  of each were explained to the (patient/caregiver).  Consent for  procedure obtained. Time Out: Verified patient identification, verified procedure,  site/side was marked, verified correct patient position, special  equipment/implants available, medications/allergies/relevent  history reviewed, required imaging and test results available.   Performed  Maximum sterile technique was used including antiseptics, cap,  gloves, gown, hand hygiene, mask and sheet. Skin prep: Chlorhexidine; local anesthetic administered A antimicrobial bonded/coated triple lumen catheter was placed in  the right internal jugular vein using the Seldinger technique.  Evaluation Blood flow good Complications: No apparent complications Patient did tolerate procedure well. Chest  X-ray ordered to verify placement.  CXR: pending.  Ricarda Frame 01/31/2015, 1:32 AM    Dg Chest Port 1 View  2015-02-28   CLINICAL DATA:  Acute onset of generalized weakness. Initial encounter.  EXAM: PORTABLE CHEST - 1 VIEW  COMPARISON:  Chest radiograph performed 01/19/2015  FINDINGS: The lungs are well-aerated. Minimal bilateral atelectasis is noted. There is no evidence of pleural effusion or pneumothorax.  The cardiomediastinal silhouette is within normal limits. No acute osseous abnormalities are seen.  IMPRESSION: Minimal bilateral atelectasis noted.  Lungs otherwise clear.   Electronically Signed   By: Roanna Raider M.D.   On: 02/28/2015 02:33   Ct Renal Stone Study  01/19/2015   CLINICAL DATA:  Severe vomiting and hypotension.  EXAM: CT ABDOMEN AND PELVIS WITHOUT CONTRAST  TECHNIQUE: Multidetector CT imaging of the abdomen and pelvis  was performed following the standard protocol without IV contrast.  COMPARISON:  Abdomen CT 07/05/2005  FINDINGS: Lower chest: No acute findings.  Hepatobiliary: No mass visualized on this unenhanced exam. Gallbladder is unremarkable.  Pancreas: No mass or inflammatory process visualized on this unenhanced exam. Diffuse fatty replacement of pancreas noted.  Spleen:  Within normal limits in size.  Adrenal Glands:  No masses identified.  Kidneys/Urinary tract: No evidence of urolithiasis or hydronephrosis.  Stomach/Bowel/Peritoneum: Sigmoid diverticulosis is noted. No evidence of diverticulitis or other inflammatory process. Normal appendix visualized.  Vascular/Lymphatic: No pathologically enlarged lymph nodes identified. No abdominal aortic aneurysm or other significant retroperitoneal abnormality demonstrated.  Reproductive:  No mass or other significant abnormality noted.  Other:  None.  Musculoskeletal:  No suspicious bone lesions identified.  IMPRESSION: Sigmoid diverticulosis. No radiographic evidence of diverticulitis or other acute findings.   Electronically Signed   By: Myles Rosenthal M.D.   On: 01/19/2015 14:14     CBC  Recent Labs Lab 02/01/15 0508 02/03/15 0429 02/04/15 0455 02/05/15 0514  WBC 16.1* 19.7* 16.7* 18.0*  HGB 8.6* 8.3* 9.1* 9.0*  HCT 25.9* 24.7* 26.8* 26.8*  PLT 227 243 259 262  MCV 84.6 82.6 84.0 83.0  MCH 28.2 27.6 28.5 27.8  MCHC 33.3 33.5 34.0 33.5  RDW 14.6* 14.2 14.4 14.5    Chemistries   Recent Labs Lab 02/03/15 0429 02/04/15 0455 02/05/15 0514 02/06/15 0427 02/07/15 0450  NA 136 134* 133* 134* 134*  K 3.1* 4.1 3.3* 3.4* 3.3*  CL 115* 112* 110 110 111  CO2 16* 13* 18* 16* 20*  GLUCOSE 104* 217* 145* 230* 229*  BUN 11 15 15 16 15   CREATININE 1.19* 1.22* 1.05* 1.08* 0.79  CALCIUM 7.5* 7.6* 7.5* 7.7* 7.4*  MG 1.8 1.6* 1.8 1.6* 1.9    ------------------------------------------------------------------------------------------------------------------ estimated creatinine clearance is 60.3 mL/min (by C-G formula based on Cr of 0.79). ------------------------------------------------------------------------------------------------------------------ No results for input(s): HGBA1C in the last 72 hours. ------------------------------------------------------------------------------------------------------------------ No results for input(s): CHOL, HDL, LDLCALC, TRIG, CHOLHDL, LDLDIRECT in the last 72 hours. ------------------------------------------------------------------------------------------------------------------ No results for input(s): TSH, T4TOTAL, T3FREE, THYROIDAB in the last 72 hours.  Invalid input(s): FREET3 ------------------------------------------------------------------------------------------------------------------ No results for input(s): VITAMINB12, FOLATE, FERRITIN, TIBC, IRON, RETICCTPCT in the last 72 hours.  Coagulation profile No results for input(s): INR, PROTIME in the last 168 hours.  No results for input(s): DDIMER in the last 72 hours.  Cardiac Enzymes No results for input(s): CKMB, TROPONINI, MYOGLOBIN in the last 168 hours.  Invalid input(s): CK ------------------------------------------------------------------------------------------------------------------ Invalid input(s): POCBNP    Assessment & Plan   #1 DKA (diabetic ketoacidoses) -resolved. Transition from insulin drip to  insulin pump, subcutaneous insulin. Appreciate endocrinology following.  #2 Dehydration ;improved #3 septic shock:due to cdiff colitis. /pneumonia;Continue Flagyl.  4.pneumonia he already finished 7 days of IV vancomycin and Zosyn.stopped vanco and zosyn# 5,. Acute renal failure with hyponatremia: Improved with fluids. Does have a metabolic acidosis started on bicarbonate tablets by nephrology. Patient  nausea is improved.. Bicarbonate also improved. Acute renal  failure on chronic kidney disease stage III: Acute renal failure disease from diabetic ketoacidosis, infective colitis, hypovolemia. All this is improving.  #5recent ribs fracture- when necessary pain control, incentive spirometry try to avoid narcotics which may be playing a role in her lethargy and weakness  #6 Hypothyroidism - changed to by mouth thyroxine. 7.acute respiratory failure due to bilateral pneumonia; improved.Oxygen. Finished IV vancomycin and Zosyn for 7 days. dis continue steroids at this time. Because she does not have any more wheezing.  #8. Severe  Malnutrition;due to  her acute illness started on dysphagia 1 diet by speech,.reeval by speech to see if we can upgrde diet. seen by RD, continue Carnation instant breakfast Edges secondary to Xanax that she received. This will further doses of Xanax. Patient is very sensitive to benzodiazepine. Hypothermia likely due to medication induced. Unable to rule out infection, watch closely for fevers repeat cultures blood culture, urine cultures if needed.  7. CODE STATUS DO NOT RESUSCITATE. Discussed this with patient's daughter did express that patient wants to be DO NOT RESUSCITATE.      Code Status Orders        Start     Ordered   02/04/2015 0505  Full code   Continuous     02/11/2015 0504           Consults endocrinology   DVT Prophylaxis  heparin  Lab Results  Component Value Date   PLT 262 02/05/2015     Time Spent in minutes  45 minutes of critical care time patient still on insulin drip blood glucose and needs to be monitored closely Yahel Fuston M.D on 02/07/2015 at 12:03 PM  Between 7am to 6pm - Pager - [phone]  After 6pm go to [email or web address] - password [email or web address] [facility]  [facility]   Office  [phone]

## 2015-02-07 NOTE — Progress Notes (Signed)
Inpatient Diabetes Program Recommendations  AACE/ADA: New Consensus Statement on Inpatient Glycemic Control (2013)  Target Ranges:  Prepandial:   less than 140 mg/dL      Peak postprandial:   less than 180 mg/dL (1-2 hours)      Critically ill patients:  140 - 180 mg/dL   Reason for glycemic review: insulin pump  Spoke with RN this am regarding insulin pump management.  Nurse states that patient is very drowsy from meds overnight- if she does not rouse, she may not be a candidate to remain on her pump.  MD- please change the  insulin pump management orders in the chart to tid, hs and 2 am (verses continuous) so RN can document the insulin the patient is administering.   Susette Racer, RN, BA, MHA, CDE Diabetes Coordinator Inpatient Diabetes Program  [phone] (Team Pager) [phone] [facility] Office) 02/07/2015 7:38 AM

## 2015-02-07 NOTE — Plan of Care (Signed)
Problem: SLP Dysphagia Goals Goal: Misc Dysphagia Goal Pt will safely tolerate po diet of least restrictive consistency w/ no overt s/s of aspiration noted by Staff/pt/family x3 sessions.    

## 2015-02-07 NOTE — Progress Notes (Signed)
Pt. Complained of anxiety during the night.  Pt was given xanax at 2:59.  Pt was able to relax and sleep.  She had not been able to sleep for the past 2 nights.

## 2015-02-07 NOTE — Evaluation (Signed)
Physical Therapy Evaluation Patient Details Name: [name] MRN: [id number] DOB: [birth year] Today's Date: 02/07/2015   History of Present Illness  Pt here recently with rib fx, went to [facility] and returns with DKA, her temperature has been in ~92 deg  Clinical Impression  Pt is lethargic (likely somewhat related to meds) and is unable to fully participate. She needs heavy assist to get to sitting and needs assist to maintain sitting balance.  Pt very weak, does not tolerate prolonged sitting and needs to lay back down less than a minute after getting up.  She shows good effort, but is too weak/lethargic to try standing, etc today.    Follow Up Recommendations SNF    Equipment Recommendations       Recommendations for Other Services       Precautions / Restrictions Precautions Precautions: Fall Restrictions Weight Bearing Restrictions: No      Mobility  Bed Mobility Overal bed mobility: Needs Assistance Bed Mobility: Supine to Sit;Sit to Supine     Supine to sit: Max assist Sit to supine: Max assist   General bed mobility comments: Pt needs assist just to remain supright in sitting.  She has multiple losses of balance backwards and quickly datigues with sitting  Transfers                 General transfer comment: deferred  Ambulation/Gait                Stairs            Wheelchair Mobility    Modified Rankin (Stroke Patients Only)       Balance   Sitting-balance support: Bilateral upper extremity supported                                         Pertinent Vitals/Pain Pain Assessment:  (general pain, no specifics)    Home Living Family/patient expects to be discharged to:: Private residence Living Arrangements: Spouse/significant other Available Help at Discharge: Family   Home Access: Stairs to enter Entrance Stairs-Rails: None (states they are putting them up) Entrance Stairs-Number of Steps: 4           Prior Function Level of Independence: Needs assistance         Comments: Pt has been needing a walker, etc at rehab.  Prior to previous hospitalization she was able to do all she needed w/o AD and w/o issue     Hand Dominance        Extremity/Trunk Assessment   Upper Extremity Assessment: Generalized weakness           Lower Extremity Assessment: Generalized weakness (able to lift legs against gravity, shows some strength)         Communication   Communication: No difficulties  Cognition Arousal/Alertness: Lethargic;Suspect due to medications Behavior During Therapy: [facility] for tasks assessed/performed Overall Cognitive Status:  (pt able to participate, but has slurred speech)                      General Comments      Exercises        Assessment/Plan    PT Assessment Patient needs continued PT services  PT Diagnosis Difficulty walking;Generalized weakness;Acute pain   PT Problem List Decreased strength;Decreased balance;Decreased mobility;Decreased safety awareness;Decreased knowledge of use of DME;Decreased cognition  PT Treatment Interventions DME instruction;Gait training;Therapeutic activities;Therapeutic  exercise;Balance training   PT Goals (Current goals can be found in the Care Plan section) Acute Rehab PT Goals Patient Stated Goal: wants to get back to her normal independent life PT Goal Formulation: With patient Time For Goal Achievement: 02/21/15 Potential to Achieve Goals: Fair    Frequency Min 2X/week   Barriers to discharge        Co-evaluation               End of Session                 Time: 1610-9604 PT Time Calculation (min) (ACUTE ONLY): 19 min   Charges:   PT Evaluation $Initial PT Evaluation Tier I: 1 Procedure     PT G Codes:       Loran Senters, PT, DPT [phone]  Malachi Pro 02/07/2015, 5:35 PM

## 2015-02-07 NOTE — Progress Notes (Signed)
Pharmacy Consult for Electrolyte Management   No Known Allergies  Patient Measurements: Height:  (154.9 cm) Weight: 150 lb 9.2 oz (68.3 kg) IBW/kg (Calculated) : 47.8  Vital Signs: Temp: 97.7 F (36.5 C) (09/08 2300) Temp Source: Oral (09/08 2300) BP: 91/51 mmHg (09/09 0400) Pulse Rate: 70 (09/09 0400) Intake/Output from previous day: 09/08 0701 - 09/09 0700 In: 1000 [I.V.:550; IV Piggyback:450] Out: -  Intake/Output from this shift:    Labs:  Recent Labs  02/05/15 0514  WBC 18.0*  HGB 9.0*  HCT 26.8*  PLT 262    Recent Labs  02/05/15 0514 02/06/15 0427 02/07/15 0450  NA 133* 134* 134*  K 3.3* 3.4* 3.3*  CL 110 110 111  CO2 18* 16* 20*  GLUCOSE 145* 230* 229*  BUN CREATININE 1.05* 1.08* 0.79  CALCIUM 7.5* 7.7* 7.4*  MG 1.8 1.6* 1.9  PHOS 2.6 2.5 2.5   Estimated Creatinine Clearance: 60.3 mL/min (by C-G formula based on Cr of 0.79).    Recent Labs  02/06/15 1746 02/06/15 2054 02/07/15 0250  GLUCAP 210* 205* 199*    Medical History: Past Medical History  Diagnosis Date  . Diabetes mellitus type 1     (prior saw Dr. Lodema Hong endo, would like to establish locally)  . Generalized headaches     frequent  . Hypothyroidism   . Convulsions/seizures     unknown type, nml EEG  . History of chicken pox   . Anemia   . Vitamin B12 deficiency 03/2012    normal IF    Medications:  Scheduled:  . antiseptic oral rinse  7 mL Mouth Rinse BID  . calcium gluconate  2 g Intravenous Once  . clotrimazole  10 mg Oral 5 X Daily  . enoxaparin (LOVENOX) injection  40 mg Subcutaneous Q24H  . levothyroxine  100 mcg Oral QAC breakfast  . metroNIDAZOLE  500 mg Oral 3 times per day  . ofloxacin  1 drop Both Eyes QID  . potassium chloride  10 mEq Intravenous Q1 Hr x 4  . sodium bicarbonate  650 mg Oral TID  . sodium chloride  3 mL Intravenous Q12H   Infusions:  . dextrose 5 % and 0.9% NaCl 50 mL/hr at 02/06/15 1400  . insulin pump     PRN:  acetaminophen **OR** acetaminophen, ALPRAZolam, calcium carbonate, ipratropium-albuterol, ondansetron **OR** ondansetron (ZOFRAN) IV, sodium chloride, sodium chloride  Assessment: Phamracy consulted to assist in managing electrolytes in this 69 y/o F with DKA.   Plan:  Phosphorus and magnesium are low so will replace with magnesium sulfate 2 g iv once and sodium phosphate 10 mmol iv once. Will recheck electrolytes with AM labs.  Pharmacy to follow per consult.   9/7 Potassium 3.4 and Calcium 8.2 corrected. Will order KCl 20 mEq IV and 1 gram calcium gluconate IV. F/u labs in AM.  9/8 Potassium 3.4, Mg 1.6, and Ca 7.7. KCl 20 mEq, calcium gluconate 1 gram and magnesium sulfate 2 grams ordered. F/u labs in AM.  9/9 Potassium 3.3 and calcium 7.4. KCl 40 mEq and calcium gluconate 2 grams x1 ordered. F/u labs in AM.  Lendora Keys S, [facility] Clinical Pharmacist   02/07/2015,6:08 AM

## 2015-02-08 LAB — PHOSPHORUS: PHOSPHORUS: 3.3 mg/dL (ref 2.5–4.6)

## 2015-02-08 LAB — BASIC METABOLIC PANEL
Anion gap: 3 — ABNORMAL LOW (ref 5–15)
BUN: 12 mg/dL (ref 6–20)
CALCIUM: 7.7 mg/dL — AB (ref 8.9–10.3)
CO2: 20 mmol/L — ABNORMAL LOW (ref 22–32)
CREATININE: 0.69 mg/dL (ref 0.44–1.00)
Chloride: 113 mmol/L — ABNORMAL HIGH (ref 101–111)
Glucose, Bld: 253 mg/dL — ABNORMAL HIGH (ref 65–99)
Potassium: 4 mmol/L (ref 3.5–5.1)
SODIUM: 136 mmol/L (ref 135–145)

## 2015-02-08 LAB — GLUCOSE, CAPILLARY
GLUCOSE-CAPILLARY: 231 mg/dL — AB (ref 65–99)
GLUCOSE-CAPILLARY: 171 mg/dL — AB (ref 65–99)
GLUCOSE-CAPILLARY: 147 mg/dL — AB (ref 65–99)
Glucose-Capillary: 177 mg/dL — ABNORMAL HIGH (ref 65–99)
Glucose-Capillary: 254 mg/dL — ABNORMAL HIGH (ref 65–99)
Glucose-Capillary: 175 mg/dL — ABNORMAL HIGH (ref 65–99)
Glucose-Capillary: 206 mg/dL — ABNORMAL HIGH (ref 65–99)

## 2015-02-08 LAB — CBC
HCT: 27.1 % — ABNORMAL LOW (ref 35.0–47.0)
Hemoglobin: 8.8 g/dL — ABNORMAL LOW (ref 12.0–16.0)
MCH: 27.8 pg (ref 26.0–34.0)
MCHC: 32.4 g/dL (ref 32.0–36.0)
MCV: 85.7 fL (ref 80.0–100.0)
PLATELETS: 194 10*3/uL (ref 150–440)
RBC: 3.16 MIL/uL — ABNORMAL LOW (ref 3.80–5.20)
RDW: 14.6 % — ABNORMAL HIGH (ref 11.5–14.5)
WBC: 11.4 10*3/uL — ABNORMAL HIGH (ref 3.6–11.0)

## 2015-02-08 LAB — TSH: TSH: 5.985 u[IU]/mL — ABNORMAL HIGH (ref 0.350–4.500)

## 2015-02-08 LAB — MAGNESIUM: MAGNESIUM: 1.6 mg/dL — AB (ref 1.7–2.4)

## 2015-02-08 LAB — ALBUMIN: Albumin: 1.7 g/dL — ABNORMAL LOW (ref 3.5–5.0)

## 2015-02-08 MED ORDER — INSULIN GLARGINE 100 UNIT/ML ~~LOC~~ SOLN
7.0000 [IU] | Freq: Every day | SUBCUTANEOUS | Status: DC
  Administered 2015-02-08 – 2015-02-09 (×2): 7 [IU] via SUBCUTANEOUS
  Filled 2015-02-08 (×3): qty 0.07

## 2015-02-08 MED ORDER — MAGNESIUM SULFATE 2 GM/50ML IV SOLN
2.0000 g | Freq: Once | INTRAVENOUS | Status: AC
  Administered 2015-02-08: 2 g via INTRAVENOUS
  Filled 2015-02-08: qty 50

## 2015-02-08 MED ORDER — INSULIN ASPART 100 UNIT/ML ~~LOC~~ SOLN
5.0000 [IU] | Freq: Once | SUBCUTANEOUS | Status: AC
  Administered 2015-02-08: 5 [IU] via SUBCUTANEOUS
  Filled 2015-02-08: qty 5

## 2015-02-08 MED ORDER — HALOPERIDOL LACTATE 5 MG/ML IJ SOLN
2.0000 mg | Freq: Once | INTRAMUSCULAR | Status: AC
  Administered 2015-02-08: 2 mg via INTRAVENOUS
  Filled 2015-02-08: qty 1

## 2015-02-08 MED ORDER — INSULIN ASPART 100 UNIT/ML ~~LOC~~ SOLN
0.0000 [IU] | Freq: Three times a day (TID) | SUBCUTANEOUS | Status: DC
  Administered 2015-02-10: 1 [IU] via SUBCUTANEOUS
  Administered 2015-02-11: 2 [IU] via SUBCUTANEOUS
  Administered 2015-02-11 – 2015-02-12 (×2): 1 [IU] via SUBCUTANEOUS
  Filled 2015-02-08 (×2): qty 1
  Filled 2015-02-08: qty 2
  Filled 2015-02-08: qty 1

## 2015-02-08 NOTE — Progress Notes (Signed)
Chart reviewed. Discussed status with Nsg. Pt is tolerating diet but PO's remain poor. Pt is still lethargic and needs extended time to swallow meds and PO's. Per Nsg taking meds whole, with water, one at a time. Slow but no s/s of aspiration. Rec continue with current diet. Will f/u 1-3 days

## 2015-02-08 NOTE — Progress Notes (Signed)
Endocrinology note:  BG trend reviewed. Pt needs additional basal insulin coverage.  Recommend: Increase Lantus to 7 units each evening. Prandial coverage with 1 unit novolog for every 20 grams of carbohydrate with each meal. Correction insulin per custom scale starting at BG 150 mg/dL at mealtimes only.  Please be cautious about giving correction insulin as patient is quite sensitive and hypoglycemia is a risk.  Will continue to follow.   Doylene Canning, MD [facility] Endocrinology

## 2015-02-08 NOTE — Progress Notes (Signed)
Blood glucose 254, notified Dr. Sheryle Hail, one time dose of insulin ordered. Will administer and reassess.

## 2015-02-08 NOTE — Clinical Social Work Note (Signed)
Patient is transitioning from an insulin drip to an insulin pump. Once stable, patient to return to [employer] as documented by a previous CSW note. York Spaniel MSW,LCSW [phone]

## 2015-02-08 NOTE — Progress Notes (Signed)
[facility] - [place] at [facility]                                                                                                                                                                                            Patient Demographics   [name], is a 69 y.o. female, DOB - [birth year], WUJ:[id number]  Admit date - 02/26/2015   Admitting Physician Oralia Manis, MD  Outpatient Primary MD for the patient is Lauro Regulus., MD   LOS - 9  Subjective: Patient was recently hospitalized with nausea vomiting and DKA. He was discharged to rehabilitation. Patient at the skilled nursing facility was noted to have hypotension, was on insulin drip, Levophed. He came off insulin drip . 9/8/,on switched  insulin pump. . Still has hypothermia,temp 91.21F, but refuses to bear hugger. By mouth intake is better.   Review of Systems:   Limited due to patient being lethargic   Vitals:   Filed Vitals:   02/08/15 0450 02/08/15 0500 02/08/15 0600 02/08/15 0812  BP:  89/57 90/52   Pulse:  73 74   Temp:  92.7 F (33.7 C) 92.8 F (33.8 C) 91.7 F (33.2 C)  TempSrc:    Oral  Resp:  23 22   Height:      Weight: 69.5 kg (153 lb 3.5 oz)     SpO2:  99% 99%     Wt Readings from Last 3 Encounters:  02/08/15 69.5 kg (153 lb 3.5 oz)  01/22/15 56.8 kg (125 lb 3.5 oz)  01/17/15 53.071 kg (117 lb)     Intake/Output Summary (Last 24 hours) at 02/08/15 0933 Last data filed at 02/08/15 0811  Gross per 24 hour  Intake   1480 ml  Output      0 ml  Net   1480 ml    Physical Exam:   GENERAL lethargic HEAD, EYES, EARS, NOSE AND THROAT: Atraumatic, normocephalic. Extraocular muscles are intact. Pupils equal and reactive to light. Sclerae anicteric. No conjunctival injection. No oro-pharyngeal erythema.  NECK: Supple. There is no jugular venous distention. No bruits, no lymphadenopathy, no thyromegaly.  HEART: Regular rate and rhythm,. No murmurs, no rubs, no clicks.   LUNGS: Bilateral expiratory wheeze in all lung fields. ABDOMEN: Soft, flat, nontender, nondistended. Has good bowel sounds. No hepatosplenomegaly appreciated.  EXTREMITIES: No evidence of any cyanosis, clubbing, or peripheral edema.  +2 pedal and radial pulses bilaterally.  NEUROLOGIC: Patient is sleepy but able to move all extremities no focal deficit appreciated SKIN: Moist and warm with no rashes appreciated.  Psych: Not anxious, depressed LN: No inguinal LN enlargement  Antibiotics   Anti-infectives    Start     Dose/Rate Route Frequency Ordered Stop   02/06/15 1400  metroNIDAZOLE (FLAGYL) tablet 500 mg     500 mg Oral 3 times per day 02/06/15 1051     02/02/15 1815  metroNIDAZOLE (FLAGYL) IVPB 500 mg  Status:  Discontinued     500 mg 100 mL/hr over 60 Minutes Intravenous Every 8 hours 02/02/15 1804 02/06/15 1051   01/31/15 1600  vancomycin (VANCOCIN) IVPB 750 mg/150 ml premix  Status:  Discontinued     750 mg 150 mL/hr over 60 Minutes Intravenous Every 36 hours 02/06/2015 0516 02/18/2015 1228   01/31/15 0400  vancomycin (VANCOCIN) IVPB 750 mg/150 ml premix  Status:  Discontinued     750 mg 150 mL/hr over 60 Minutes Intravenous Every 24 hours 02/09/2015 1228 02/05/15 1148   02/08/2015 0600  piperacillin-tazobactam (ZOSYN) IVPB 3.375 g  Status:  Discontinued     3.375 g 12.5 mL/hr over 240 Minutes Intravenous 3 times per day 02/20/2015 0516 02/05/15 1148   02/23/2015 0345  vancomycin (VANCOCIN) IVPB 1000 mg/200 mL premix     1,000 mg 200 mL/hr over 60 Minutes Intravenous  Once 02/06/2015 0331 02/21/2015 0506   02/08/2015 0345  piperacillin-tazobactam (ZOSYN) IVPB 3.375 g  Status:  Discontinued     3.375 g 12.5 mL/hr over 240 Minutes Intravenous  Once 02/25/2015 0331 02/01/2015 0745      Medications   Scheduled Meds: . antiseptic oral rinse  7 mL Mouth Rinse BID  . calcium carbonate  200 mg of elemental calcium Oral BID  . clotrimazole  10 mg Oral 5 X Daily  . enoxaparin (LOVENOX) injection   40 mg Subcutaneous Q24H  . insulin aspart  0-10 Units Subcutaneous TID WC  . insulin aspart  0-6 Units Subcutaneous TID WC  . insulin glargine  7 Units Subcutaneous QHS  . levothyroxine  100 mcg Oral QAC breakfast  . metroNIDAZOLE  500 mg Oral 3 times per day  . ofloxacin  1 drop Both Eyes QID  . sodium bicarbonate  650 mg Oral TID  . sodium chloride  3 mL Intravenous Q12H   Continuous Infusions: . dextrose 5 % and 0.9% NaCl 50 mL/hr at 02/08/15 0700   PRN Meds:.acetaminophen **OR** acetaminophen, ALPRAZolam, calcium carbonate, ipratropium-albuterol, ondansetron **OR** ondansetron (ZOFRAN) IV, sodium chloride, sodium chloride   Data Review:   Micro Results Recent Results (from the past 240 hour(s))  Culture, blood (routine x 2)     Status: None   Collection Time: 02/04/2015  3:34 AM  Result Value Ref Range Status   Specimen Description BLOOD BLOOD RIGHT FOREARM  Final   Special Requests BOTTLES DRAWN AEROBIC AND ANAEROBIC  Final   Culture  Setup Time   Final    GRAM POSITIVE RODS IN BOTH AEROBIC AND ANAEROBIC BOTTLES CRITICAL RESULT CALLED TO, READ BACK BY AND VERIFIED WITH: BRITTANY RUDD ON 02/26/2015 AT 1752 BY TB CONFIRMED BY TB/MS.    Culture   Final    BACILLUS SPECIES IN BOTH AEROBIC AND ANAEROBIC BOTTLES Results consistent with contamination.    Report Status 02/03/2015 FINAL  Final  Culture, blood (routine x 2)     Status: None   Collection Time: 02/02/2015  3:34 AM  Result Value Ref Range Status   Specimen Description BLOOD RIGHT HAND  Final   Special Requests BOTTLES DRAWN AEROBIC AND ANAEROBIC  Final   Culture NO GROWTH 5 DAYS  Final   Report  Status 02/04/2015 FINAL  Final  MRSA PCR Screening     Status: None   Collection Time: 02-11-2015  5:15 AM  Result Value Ref Range Status   MRSA by PCR NEGATIVE NEGATIVE Final    Comment:        The GeneXpert MRSA Assay (FDA approved for NASAL specimens only), is one component of a comprehensive MRSA  colonization surveillance program. It is not intended to diagnose MRSA infection nor to guide or monitor treatment for MRSA infections.   C difficile quick scan w PCR reflex     Status: Abnormal   Collection Time: 02/02/15  4:12 PM  Result Value Ref Range Status   C Diff antigen POSITIVE (A) NEGATIVE Final   C Diff toxin POSITIVE (A) NEGATIVE Final   C Diff interpretation   Final    Positive for toxigenic C. difficile, active toxin production present.    Comment: CRITICAL RESULT CALLED TO, READ BACK BY AND VERIFIED WITH: CHRISTINA MILES ON 02/02/15 AT 1715 BY [facility]     Radiology Reports Dg Chest 1 View  02/01/2015   CLINICAL DATA:  69 year old female admitted to the hospital on 02/11/15 for hypotension. New onset of wheezing.  EXAM: CHEST  1 VIEW  COMPARISON:  Chest x-ray 01/31/2015.  FINDINGS: There is a right-sided internal jugular central venous catheter with tip terminating in the distal superior vena cava. Lung volumes are low. Patchy multifocal asymmetrically distributed interstitial and airspace disease, most evident throughout the mid to lower lungs bilaterally (left greater than right). Small bilateral pleural effusions (left greater than right). Pulmonary vasculature does not appear engorged. Heart size left is normal. The patient is rotated to the left on today's exam, resulting in distortion of the mediastinal contours and reduced diagnostic sensitivity and specificity for mediastinal pathology. Atherosclerosis in the thoracic aorta.  IMPRESSION: 1. Interval development of asymmetrically distributed patchy multifocal interstitial and airspace disease in the lungs bilaterally (left greater than right), concerning for multilobar pneumonia. 2. Small bilateral pleural effusions (left greater than right). 3. Atherosclerosis.   Electronically Signed   By: Trudie Reed M.D.   On: 02/01/2015 11:45   Dg Chest 2 View  01/19/2015   CLINICAL DATA:  Status post left fourth through sixth rib  fractures 01/17/2015 after a fall. Vomiting and diarrhea today. Subsequent encounter.  EXAM: CHEST  2 VIEW  COMPARISON:  Plain films of the chest and left ribs 01/17/2015. PA and lateral chest 05/15/2013. CT chest 01/06/2010.  FINDINGS: Left fourth through seventh rib fractures are noted. No new fracture is identified. The lungs are clear. No pneumothorax or pleural effusion is identified. Heart size is normal. Aortic atherosclerosis is noted.  IMPRESSION: Left fourth through seventh rib fractures. Negative for pneumothorax or acute cardiopulmonary disease.   Electronically Signed   By: Drusilla Kanner M.D.   On: 01/19/2015 14:14   Dg Ribs Unilateral W/chest Left  01/17/2015   CLINICAL DATA:  Status post fall today complaining of left posterior rib pain.  EXAM: LEFT RIBS AND CHEST - 3+ VIEW  COMPARISON:  May 15, 2013  FINDINGS: There are displaced fractures of the left fourth, fifth, sixth, seventh ribs. There questioned fractures of the left second and third ribs. There is no pneumothorax. The lungs are clear. The mediastinal contour and cardiac silhouette are normal.  IMPRESSION: Fractures of left ribs as described.   Electronically Signed   By: Sherian Rein M.D.   On: 01/17/2015 16:53   Dg Thoracic Spine 2 View  01/17/2015   CLINICAL DATA:  Patient status post fall while walking up the stairs. Left posterior rib and thoracic spine pain worse when breathing.  EXAM: THORACIC SPINE 2 VIEWS  COMPARISON:  Chest radiograph 05/15/2013  FINDINGS: Superior thoracic vertebral bodies are not well visualized. The mid and lower thoracic vertebral bodies are visualized and appear in normal height with normal intervertebral disc spaces. There is rightward curvature of the proximal thoracic spine demonstrated on the AP view. Multiple left-sided rib fractures, incompletely evaluated on current exam.  IMPRESSION: Rightward curvature of the proximal thoracic spine. Recommend correlation for point tenderness at this  location.  Otherwise relative preservation the vertebral body heights.  Incompletely visualized probable left rib fractures. See dedicated chest and rib report.   Electronically Signed   By: Annia Belt M.D.   On: 01/17/2015 16:52   Ct Head Wo Contrast  01/31/2015   CLINICAL DATA:  Lethargy and decreased responsiveness today.  EXAM: CT HEAD WITHOUT CONTRAST  TECHNIQUE: Contiguous axial images were obtained from the base of the skull through the vertex without intravenous contrast.  COMPARISON:  CT head without contrast 01/19/2015. MRI brain 08/15/2009.  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 No acute infarct, hemorrhage, or mass lesion is otherwise present. The ventricles are of normal size. No significant extra-axial fluid collection is present. The basal ganglia are intact. The insular ribbon is intact.  The paranasal sinuses and mastoid air cells are clear.  IMPRESSION: 1. No acute intracranial abnormality or significant interval change. 2. 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   Electronically Signed   By: Marin Roberts M.D.   On: 01/31/2015 16:26   Ct Head Wo Contrast  01/19/2015   CLINICAL DATA:  Intractable vomiting over the last few days.  EXAM: CT HEAD WITHOUT CONTRAST  TECHNIQUE: Contiguous axial images were obtained from the base of the skull through the vertex without intravenous contrast.  COMPARISON:  Head CT 01/17/2015  FINDINGS: Brainstem is normal. Chronically known 1 cm lesion of the left cerebellum is again visible, consistent with a chronic cavernous angioma. Some adjacent brain low density is present as has been seen previously. No sign that there is any active process in this region. The cerebral hemispheres are normal. No evidence of old or acute infarction, mass lesion, hemorrhage, hydrocephalus or extra-axial collection. Chronic benign left calvarial lucency likely related to a  hemangioma. No acute calvarial finding. Sinuses, middle ears and mastoids are clear.  IMPRESSION: No acute intracranial finding. 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 Based on the unchanged appearance, this would be on likely to relate to the clinical presentation.   Electronically Signed   By: Paulina Fusi M.D.   On: 01/19/2015 13:31   Ct Head Wo Contrast  01/17/2015   CLINICAL DATA:  Larey Seat downstairs today.  Hit head.  EXAM: CT HEAD WITHOUT CONTRAST  CT CERVICAL SPINE WITHOUT CONTRAST  TECHNIQUE: Multidetector CT imaging of the head and cervical spine was performed following the standard protocol without intravenous contrast. Multiplanar CT image reconstructions of the cervical spine were also generated.  COMPARISON:  Head CT 08/14/2009  FINDINGS: CT HEAD FINDINGS  The ventricles are normal in size and configuration. No extra-axial fluid collections are identified. The gray-white differentiation is normal. No CT findings for acute intracranial process such as hemorrhage or infarction. No mass lesions. The brainstem and cerebellum are grossly normal.  The bony structures are  intact. The paranasal sinuses and mastoid air cells are clear. The globes are intact.  CT CERVICAL SPINE FINDINGS  Degenerative cervical spondylosis with mild multilevel disc disease and facet disease. The cervical vertebral bodies are normally aligned. No acute fracture. The facets are normally aligned. Moderate C1-2 degenerative changes but no dens fracture. The spinal canal is quite generous. No spinal or significant foraminal stenosis. The lung apices are clear.  IMPRESSION: 1. No acute intracranial findings or skull fracture. 2. Mild degenerative cervical spondylosis but no acute cervical spine fracture.   Electronically Signed   By: Rudie Meyer M.D.   On: 01/17/2015 15:21   Ct Cervical Spine Wo Contrast  01/17/2015   CLINICAL DATA:  Larey Seat downstairs today.  Hit head.  EXAM: CT HEAD WITHOUT  CONTRAST  CT CERVICAL SPINE WITHOUT CONTRAST  TECHNIQUE: Multidetector CT imaging of the head and cervical spine was performed following the standard protocol without intravenous contrast. Multiplanar CT image reconstructions of the cervical spine were also generated.  COMPARISON:  Head CT 08/14/2009  FINDINGS: CT HEAD FINDINGS  The ventricles are normal in size and configuration. No extra-axial fluid collections are identified. The gray-white differentiation is normal. No CT findings for acute intracranial process such as hemorrhage or infarction. No mass lesions. The brainstem and cerebellum are grossly normal.  The bony structures are intact. The paranasal sinuses and mastoid air cells are clear. The globes are intact.  CT CERVICAL SPINE FINDINGS  Degenerative cervical spondylosis with mild multilevel disc disease and facet disease. The cervical vertebral bodies are normally aligned. No acute fracture. The facets are normally aligned. Moderate C1-2 degenerative changes but no dens fracture. The spinal canal is quite generous. No spinal or significant foraminal stenosis. The lung apices are clear.  IMPRESSION: 1. No acute intracranial findings or skull fracture. 2. Mild degenerative cervical spondylosis but no acute cervical spine fracture.   Electronically Signed   By: Rudie Meyer M.D.   On: 01/17/2015 15:21   US Renal  02/05/2015   CLINICAL DATA:  Acute renal failure  EXAM: RENAL / URINARY TRACT ULTRASOUND COMPLETE  COMPARISON:  None.  FINDINGS: Right Kidney:  Length: 8.4 cm. Echogenicity within normal limits. No mass or hydronephrosis visualized.  Left Kidney:  Length: 9.1 cm. Echogenicity within normal limits. No mass or hydronephrosis visualized.  Bladder:  Appears normal for degree of bladder distention.  Note is made of mild ascites.  IMPRESSION: Mild ascites.  Normal-appearing kidneys.   Electronically Signed   By: Alcide Clever M.D.   On: 02/05/2015 12:36   Dg Chest Port 1 View  01/31/2015   Ricarda Frame, MD     01/31/2015  1:33 AM Central Venous Catheter Insertion Procedure Note SURYA FOLDEN [id number] 1945-07-30  Procedure: Insertion of Central Venous Catheter Indications: Drug and/or fluid administration  Procedure Details Consent: Risks of procedure as well as the alternatives and risks  of each were explained to the (patient/caregiver).  Consent for  procedure obtained. Time Out: Verified patient identification, verified procedure,  site/side was marked, verified correct patient position, special  equipment/implants available, medications/allergies/relevent  history reviewed, required imaging and test results available.   Performed  Maximum sterile technique was used including antiseptics, cap,  gloves, gown, hand hygiene, mask and sheet. Skin prep: Chlorhexidine; local anesthetic administered A antimicrobial bonded/coated triple lumen catheter was placed in  the right internal jugular vein using the Seldinger technique.  Evaluation Blood flow good Complications: No apparent complications Patient did tolerate procedure well. Chest  X-ray ordered to verify placement.  CXR: pending.  Ricarda Frame 01/31/2015, 1:32 AM    Dg Chest Port 1 View  02/10/2015   CLINICAL DATA:  Acute onset of generalized weakness. Initial encounter.  EXAM: PORTABLE CHEST - 1 VIEW  COMPARISON:  Chest radiograph performed 01/19/2015  FINDINGS: The lungs are well-aerated. Minimal bilateral atelectasis is noted. There is no evidence of pleural effusion or pneumothorax.  The cardiomediastinal silhouette is within normal limits. No acute osseous abnormalities are seen.  IMPRESSION: Minimal bilateral atelectasis noted.  Lungs otherwise clear.   Electronically Signed   By: Roanna Raider M.D.   On: 02/08/2015 02:33   Ct Renal Stone Study  01/19/2015   CLINICAL DATA:  Severe vomiting and hypotension.  EXAM: CT ABDOMEN AND PELVIS WITHOUT CONTRAST  TECHNIQUE: Multidetector CT imaging of the abdomen and pelvis was performed following the  standard protocol without IV contrast.  COMPARISON:  Abdomen CT 07/05/2005  FINDINGS: Lower chest: No acute findings.  Hepatobiliary: No mass visualized on this unenhanced exam. Gallbladder is unremarkable.  Pancreas: No mass or inflammatory process visualized on this unenhanced exam. Diffuse fatty replacement of pancreas noted.  Spleen:  Within normal limits in size.  Adrenal Glands:  No masses identified.  Kidneys/Urinary tract: No evidence of urolithiasis or hydronephrosis.  Stomach/Bowel/Peritoneum: Sigmoid diverticulosis is noted. No evidence of diverticulitis or other inflammatory process. Normal appendix visualized.  Vascular/Lymphatic: No pathologically enlarged lymph nodes identified. No abdominal aortic aneurysm or other significant retroperitoneal abnormality demonstrated.  Reproductive:  No mass or other significant abnormality noted.  Other:  None.  Musculoskeletal:  No suspicious bone lesions identified.  IMPRESSION: Sigmoid diverticulosis. No radiographic evidence of diverticulitis or other acute findings.   Electronically Signed   By: Myles Rosenthal M.D.   On: 01/19/2015 14:14     CBC  Recent Labs Lab 02/03/15 0429 02/04/15 0455 02/05/15 0514 02/08/15 0459  WBC 19.7* 16.7* 18.0* 11.4*  HGB 8.3* 9.1* 9.0* 8.8*  HCT 24.7* 26.8* 26.8* 27.1*  PLT 243 259 262 194  MCV 82.6 84.0 83.0 85.7  MCH 27.6 28.5 27.8 27.8  MCHC 33.5 34.0 33.5 32.4  RDW 14.2 14.4 14.5 14.6*    Chemistries   Recent Labs Lab 02/04/15 0455 02/05/15 0514 02/06/15 0427 02/07/15 0450 02/08/15 0459  NA 134* 133* 134* 134* 136  K 4.1 3.3* 3.4* 3.3* 4.0  CL 112* 110 110 111 113*  CO2 13* 18* 16* 20* 20*  GLUCOSE 217* 145* 230* 229* 253*  BUN 15 15 16 15 12   CREATININE 1.22* 1.05* 1.08* 0.79 0.69  CALCIUM 7.6* 7.5* 7.7* 7.4* 7.7*  MG 1.6* 1.8 1.6* 1.9 1.6*   ------------------------------------------------------------------------------------------------------------------ estimated creatinine clearance is  60.9 mL/min (by C-G formula based on Cr of 0.69). ------------------------------------------------------------------------------------------------------------------ No results for input(s): HGBA1C in the last 72 hours. ------------------------------------------------------------------------------------------------------------------ No results for input(s): CHOL, HDL, LDLCALC, TRIG, CHOLHDL, LDLDIRECT in the last 72 hours. ------------------------------------------------------------------------------------------------------------------ No results for input(s): TSH, T4TOTAL, T3FREE, THYROIDAB in the last 72 hours.  Invalid input(s): FREET3 ------------------------------------------------------------------------------------------------------------------ No results for input(s): VITAMINB12, FOLATE, FERRITIN, TIBC, IRON, RETICCTPCT in the last 72 hours.  Coagulation profile No results for input(s): INR, PROTIME in the last 168 hours.  No results for input(s): DDIMER in the last 72 hours.  Cardiac Enzymes No results for input(s): CKMB, TROPONINI, MYOGLOBIN in the last 168 hours.  Invalid input(s): CK ------------------------------------------------------------------------------------------------------------------ Invalid input(s): POCBNP    Assessment & Plan   #1 DKA (diabetic ketoacidoses) -resolved. Transition from insulin drip to  insulin pump, subcutaneous insulin. Appreciate endocrinology following. Also started on Lantus.   #2 Dehydration ;improved #3 septic shock:due to cdiff colitis. /pneumonia;Continue Flagyl.  hypothermia today. We will repeat blood cultures, urine cultures.r evaluate for sepsis.  4.pneumonia he already finished 7 days of IV vancomycin and Zosyn.stopped vanco and zosyn# 5,. Acute renal failure with hyponatremia: Improved with fluids. Does have a metabolic acidosis started on bicarbonate tablets by nephrology. Patient nausea is improved.. Bicarbonate also  improved.  6.Acute renal  failure on chronic kidney disease stage III: Acute renal failure disease from diabetic ketoacidosis, infective colitis, hypovolemia. All this is improving.  #5recent ribs fracture- when necessary pain control, incentive spirometry try to avoid narcotics which may be playing a role in her lethargy and weakness  #6 Hypothyroidism - changed to by mouth thyroxine.  7.acute respiratory failure due to bilateral pneumonia; improved.Oxygen. Finished IV vancomycin and Zosyn for 7 days. dis continue steroids at this time. Because she does not have any more wheezing.  #8. Severe  Malnutrition;due to  her acute illness started on d  seen by RD, continue [employer] instant breakfast   7. CODE STATUS DO NOT RESUSCITATE. Discussed this with patient's daughter did express that patient wants to be DO NOT RESUSCITATE.      Code Status Orders        Start     Ordered   02/04/2015 0505  Full code   Continuous     02/06/2015 0504           Consults endocrinology   DVT Prophylaxis  heparin  Lab Results  Component Value Date   PLT 194 02/08/2015     Time Spent in minutes  45 minutes of critical care time patient still on insulin drip blood glucose and needs to be monitored closely Liese Dizdarevic M.D on 02/08/2015 at 9:33 AM  Between 7am to 6pm - Pager - [phone]  After 6pm go to [email or web address] - password [email or web address] [facility]  [facility]   Office  [phone]

## 2015-02-08 NOTE — Progress Notes (Signed)
CSW informed that patient's family does not want her to return to [facility].   Call to patient's husband Bill, no answer.  Call to patient's daughter [name].  Informed CSW they do not wish for patient to return to [facility] and has requested a new bed search.  Family has visited and spoken with [facility] and this is their first option.    CSW will conduct a new bed search as patient gets closer to discharge.   FL2 has been completed, will need to be updated and faxed out.   Sammuel Hines. Theresia Majors, MSW Clinical Social Work Department Emergency Room [phone] 2:17 PM

## 2015-02-08 NOTE — Progress Notes (Signed)
Pt is refusing bair hugger, kicks it off and states she is hot, however, skin is cool to touch. Closed door to room and turned heat up, notified Hower.

## 2015-02-08 NOTE — Progress Notes (Signed)
Pharmacy Consult for Electrolyte Management   No Known Allergies  Patient Measurements: Height:  (154.9 cm) Weight: 153 lb 3.5 oz (69.5 kg) IBW/kg (Calculated) : 47.8  Vital Signs: Temp: 92.8 F (33.8 C) (09/10 0600) BP: 90/52 mmHg (09/10 0600) Pulse Rate: 74 (09/10 0600) Intake/Output from previous day: 09/09 0701 - 09/10 0700 In: 1620 [I.V.:1100; IV Piggyback:520] Out: -  Intake/Output from this shift: Total I/O In: 450 [I.V.:450] Out: -   Labs:  Recent Labs  02/08/15 0459  WBC 11.4*  HGB 8.8*  HCT 27.1*  PLT 194    Recent Labs  02/06/15 0427 02/07/15 0450 02/08/15 0459  NA 134* 134* 136  K 3.4* 3.3* 4.0  CL 110 111 113*  CO2 16* 20* 20*  GLUCOSE 230* 229* 253*  BUN CREATININE 1.08* 0.79 0.69  CALCIUM 7.7* 7.4* 7.7*  MG 1.6* 1.9 1.6*  PHOS 2.5 2.5 3.3  ALBUMIN  --   --  1.7*   Estimated Creatinine Clearance: 60.9 mL/min (by C-G formula based on Cr of 0.69).    Recent Labs  02/07/15 1618 02/07/15 2218 02/08/15 0248  GLUCAP 231* 177* 254*    Medical History: Past Medical History  Diagnosis Date  . Diabetes mellitus type 1     (prior saw Dr. Lodema Hong endo, would like to establish locally)  . Generalized headaches     frequent  . Hypothyroidism   . Convulsions/seizures     unknown type, nml EEG  . History of chicken pox   . Anemia   . Vitamin B12 deficiency 03/2012    normal IF    Medications:  Scheduled:  . antiseptic oral rinse  7 mL Mouth Rinse BID  . calcium carbonate  200 mg of elemental calcium Oral BID  . clotrimazole  10 mg Oral 5 X Daily  . enoxaparin (LOVENOX) injection  40 mg Subcutaneous Q24H  . insulin aspart  0-10 Units Subcutaneous TID WC  . insulin aspart  0-6 Units Subcutaneous TID WC  . insulin glargine  6 Units Subcutaneous QHS  . levothyroxine  100 mcg Oral QAC breakfast  . metroNIDAZOLE  500 mg Oral 3 times per day  . ofloxacin  1 drop Both Eyes QID  . sodium bicarbonate  650 mg Oral TID  .  sodium chloride  3 mL Intravenous Q12H   Infusions:  . dextrose 5 % and 0.9% NaCl 50 mL/hr at 02/08/15 0105   PRN: acetaminophen **OR** acetaminophen, ALPRAZolam, calcium carbonate, ipratropium-albuterol, ondansetron **OR** ondansetron (ZOFRAN) IV, sodium chloride, sodium chloride  Assessment: Phamracy consulted to assist in managing electrolytes in this 69 y/o F with DKA.   Plan:  Phosphorus and magnesium are low so will replace with magnesium sulfate 2 g iv once and sodium phosphate 10 mmol iv once. Will recheck electrolytes with AM labs.  Pharmacy to follow per consult.   9/7 Potassium 3.4 and Calcium 8.2 corrected. Will order KCl 20 mEq IV and 1 gram calcium gluconate IV. F/u labs in AM.  9/8 Potassium 3.4, Mg 1.6, and Ca 7.7. KCl 20 mEq, calcium gluconate 1 gram and magnesium sulfate 2 grams ordered. F/u labs in AM.  9/9 Potassium 3.3 and calcium 7.4. KCl 40 mEq and calcium gluconate 2 grams x1 ordered. F/u labs in AM.  9/10 Electrolytes WNL except Mg 1.6. Corrected calcium 9.5 for albumin of 1.7. Magnesium 2 grams ordered. Recheck in AM.  Toniqua Melamed S, RPH Clinical Pharmacist   02/08/2015,6:56 AM

## 2015-02-08 NOTE — Clinical Social Work Note (Signed)
New bedsearch initiated this afternoon after Fl2 update and updated information loaded in CFP.  York Spaniel MSW,LCSW [phone]

## 2015-02-09 ENCOUNTER — Inpatient Hospital Stay: Payer: Medicare Other | Admitting: Anesthesiology

## 2015-02-09 ENCOUNTER — Encounter: Admission: EM | Disposition: E | Payer: Self-pay | Source: Home / Self Care | Attending: Internal Medicine

## 2015-02-09 ENCOUNTER — Inpatient Hospital Stay: Payer: Medicare Other

## 2015-02-09 DIAGNOSIS — J9383 Other pneumothorax: Secondary | ICD-10-CM

## 2015-02-09 DIAGNOSIS — E860 Dehydration: Secondary | ICD-10-CM

## 2015-02-09 DIAGNOSIS — N179 Acute kidney failure, unspecified: Secondary | ICD-10-CM

## 2015-02-09 DIAGNOSIS — J939 Pneumothorax, unspecified: Secondary | ICD-10-CM | POA: Insufficient documentation

## 2015-02-09 LAB — BASIC METABOLIC PANEL
ANION GAP: 3 — AB (ref 5–15)
BUN: 11 mg/dL (ref 6–20)
CHLORIDE: 112 mmol/L — AB (ref 101–111)
CO2: 22 mmol/L (ref 22–32)
Calcium: 7.8 mg/dL — ABNORMAL LOW (ref 8.9–10.3)
Creatinine, Ser: 0.73 mg/dL (ref 0.44–1.00)
GFR calc Af Amer: >60 mL/min (ref >60)
GFR calc non Af Amer: >60 mL/min (ref >60)
GLUCOSE: 121 mg/dL — AB (ref 65–99)
POTASSIUM: 4.2 mmol/L (ref 3.5–5.1)
Sodium: 137 mmol/L (ref 135–145)

## 2015-02-09 LAB — GLUCOSE, CAPILLARY
GLUCOSE-CAPILLARY: 121 mg/dL — AB (ref 65–99)
GLUCOSE-CAPILLARY: 123 mg/dL — AB (ref 65–99)
GLUCOSE-CAPILLARY: 128 mg/dL — AB (ref 65–99)
GLUCOSE-CAPILLARY: 156 mg/dL — AB (ref 65–99)
GLUCOSE-CAPILLARY: 190 mg/dL — AB (ref 65–99)
GLUCOSE-CAPILLARY: 88 mg/dL (ref 65–99)
Glucose-Capillary: 140 mg/dL — ABNORMAL HIGH (ref 65–99)
Glucose-Capillary: 177 mg/dL — ABNORMAL HIGH (ref 65–99)

## 2015-02-09 LAB — PHOSPHORUS: Phosphorus: 3.2 mg/dL (ref 2.5–4.6)

## 2015-02-09 LAB — MAGNESIUM: Magnesium: 1.7 mg/dL (ref 1.7–2.4)

## 2015-02-09 SURGERY — CHEST TUBE INSERTION
Anesthesia: Monitor Anesthesia Care

## 2015-02-09 MED ORDER — BUPIVACAINE HCL (PF) 0.25 % IJ SOLN
INTRAMUSCULAR | Status: AC
  Filled 2015-02-09: qty 30

## 2015-02-09 MED ORDER — FUROSEMIDE 10 MG/ML IJ SOLN
40.0000 mg | Freq: Once | INTRAMUSCULAR | Status: AC
  Administered 2015-02-09: 40 mg via INTRAVENOUS
  Filled 2015-02-09: qty 4

## 2015-02-09 MED ORDER — MODAFINIL 100 MG PO TABS
100.0000 mg | ORAL_TABLET | Freq: Every day | ORAL | Status: DC
  Administered 2015-02-09 – 2015-02-15 (×7): 100 mg via ORAL
  Filled 2015-02-09 (×7): qty 1

## 2015-02-09 MED ORDER — CEFAZOLIN (ANCEF) 1 G IV SOLR
1.0000 g | INTRAVENOUS | Status: DC
  Filled 2015-02-09: qty 1

## 2015-02-09 MED ORDER — LEVOTHYROXINE SODIUM 75 MCG PO TABS
125.0000 ug | ORAL_TABLET | Freq: Every day | ORAL | Status: DC
  Administered 2015-02-10 – 2015-02-15 (×6): 125 ug via ORAL
  Filled 2015-02-09 (×6): qty 1

## 2015-02-09 MED ORDER — INSULIN GLARGINE 100 UNIT/ML ~~LOC~~ SOLN
6.0000 [IU] | Freq: Every day | SUBCUTANEOUS | Status: DC
  Administered 2015-02-09 – 2015-02-12 (×4): 6 [IU] via SUBCUTANEOUS
  Filled 2015-02-09 (×6): qty 0.06

## 2015-02-09 MED ORDER — KETAMINE HCL 10 MG/ML IJ SOLN
INTRAMUSCULAR | Status: DC | PRN
  Administered 2015-02-09: 20 mg via INTRAVENOUS
  Administered 2015-02-09: 25 mg via INTRAVENOUS

## 2015-02-09 MED ORDER — MIDAZOLAM HCL 2 MG/2ML IJ SOLN
INTRAMUSCULAR | Status: DC | PRN
  Administered 2015-02-09: 1 mg via INTRAVENOUS

## 2015-02-09 MED ORDER — CEFAZOLIN SODIUM 1-5 GM-% IV SOLN
1.0000 g | Freq: Once | INTRAVENOUS | Status: AC
  Administered 2015-02-09: 1 g via INTRAVENOUS
  Filled 2015-02-09: qty 50

## 2015-02-09 MED ORDER — LEVOTHYROXINE SODIUM 75 MCG PO TABS: 150.0000 ug | ORAL_TABLET | Freq: Every day | ORAL | Status: DC

## 2015-02-09 SURGICAL SUPPLY — 23 items
BLADE SURG SZ11 CARB STEEL (BLADE) ×1 IMPLANT
CANISTER SUCT 1200ML W/VALVE (MISCELLANEOUS) ×1 IMPLANT
CATH THOR STR 28F  SOFT WA (CATHETERS)
CATH THOR STR 28F SOFT WA (CATHETERS) ×1 IMPLANT
CATH THOR STR 32F 8032 SOFT WA (CATHETERS) ×1 IMPLANT
CHLORAPREP W/TINT 26ML (MISCELLANEOUS) ×1 IMPLANT
DRAIN CHEST DRY SUCT SGL (MISCELLANEOUS) ×1 IMPLANT
DRAPE PED LAPAROTOMY (DRAPES) ×1 IMPLANT
DRAPE SHEET LG 3/4 BI-LAMINATE (DRAPES) ×1 IMPLANT
DRAPE UTILITY 15X26 TOWEL STRL (DRAPES) ×2 IMPLANT
GAUZE PETROLATUM 1 X8 (GAUZE/BANDAGES/DRESSINGS) ×1 IMPLANT
GAUZE SPONGE 4X4 12PLY STRL (GAUZE/BANDAGES/DRESSINGS) ×1 IMPLANT
GLOVE BIO SURGEON STRL SZ7.5 (GLOVE) ×1 IMPLANT
GOWN STRL REUS W/ TWL LRG LVL3 (GOWN DISPOSABLE) ×1 IMPLANT
GOWN STRL REUS W/ TWL XL LVL3 (GOWN DISPOSABLE) ×1 IMPLANT
GOWN STRL REUS W/TWL LRG LVL3 (GOWN DISPOSABLE)
GOWN STRL REUS W/TWL XL LVL3 (GOWN DISPOSABLE)
KIT RM TURNOVER STRD PROC AR (KITS) ×1 IMPLANT
LABEL OR SOLS (LABEL) ×1 IMPLANT
PACK BASIN MINOR ARMC (MISCELLANEOUS) ×1 IMPLANT
PAD GROUND ADULT SPLIT (MISCELLANEOUS) ×1 IMPLANT
SUT SILK 0 SH 30 (SUTURE) ×4 IMPLANT
SYRINGE 10CC LL (SYRINGE) ×1 IMPLANT

## 2015-02-09 NOTE — Progress Notes (Addendum)
[facility] - [place] at [facility]                                                                                                                                                                                            Patient Demographics   [name], is a 69 y.o. female, DOB - 06-24-1945, ZOX:[id number]  Admit date - 2015-02-17   Admitting Physician Oralia Manis, MD  Outpatient Primary MD for the patient is Lauro Regulus., MD   LOS - 10  Subjective: Patient was recently hospitalized with nausea vomiting and DKA. He was discharged to rehabilitation. Patient at the skilled nursing facility was noted to have hypotension, was on insulin drip, Levophed. He came off insulin drip . 9/8/,on switched  insulin pump. . Patient has been having hypothermia , with a temp up to 95.5. As low as 91.63F. She is lethargic. According to the staff patient to the becomes active at night watches TV all night. Acute peripheral edema of hands bilaterally.  Review of Systems:   Lethargic but able to answer questions appropriately. Cardiovascular no chest pain  respiratory denies any shortness of breath or cough  GI no nausea no vomiting or abdominal pain.  Extremities next day pain. Neurological he denies any tingling or numbness in the legs.  General;patient has the edema of both hands and legs.  Vitals:   Filed Vitals:   01/30/2015 0500 02/24/2015 0513 02/17/2015 0600 02/25/2015 0700  BP: 110/63  100/54 101/62  Pulse: 85  82 82  Temp: 95.7 F (35.4 C)  95.5 F (35.3 C) 95.5 F (35.3 C)  TempSrc: Rectal  Rectal   Resp: 37  35 41  Height:      Weight:  70 kg (154 lb 5.2 oz)    SpO2: 96%  96% 96%    Wt Readings from Last 3 Encounters:  02/10/2015 70 kg (154 lb 5.2 oz)  01/22/15 56.8 kg (125 lb 3.5 oz)  01/17/15 53.071 kg (117 lb)     Intake/Output Summary (Last 24 hours) at 02/19/2015 0853 Last data filed at 02/28/2015 0800  Gross per 24 hour  Intake    665 ml  Output       0 ml  Net    665 ml    Physical Exam:   GENERAL lethargic HEAD, EYES, EARS, NOSE AND THROAT: Atraumatic, normocephalic. Extraocular muscles are intact. Pupils equal and reactive to light. Sclerae anicteric. No conjunctival injection. No oro-pharyngeal erythema.  NECK: Supple. There is no jugular venous distention. No bruits, no lymphadenopathy, no thyromegaly.  HEART: Regular rate and rhythm,. No murmurs, no rubs, no  clicks.  LUNGS: Bilateral expiratory wheeze in all lung fields. ABDOMEN: Soft, flat, nontender, nondistended. Has good bowel sounds. No hepatosplenomegaly appreciated.  EXTREMITIES: No evidence of any cyanosis, clubbing, or peripheral edema.  +2 pedal and radial pulses bilaterally.  NEUROLOGIC: Patient is sleepy but able to move all extremities no focal deficit appreciated SKIN: Moist and warm with no rashes appreciated.  Psych: Not anxious, depressed LN: No inguinal LN enlargement    Antibiotics   Anti-infectives    Start     Dose/Rate Route Frequency Ordered Stop   02/06/15 1400  metroNIDAZOLE (FLAGYL) tablet 500 mg     500 mg Oral 3 times per day 02/06/15 1051     02/02/15 1815  metroNIDAZOLE (FLAGYL) IVPB 500 mg  Status:  Discontinued     500 mg 100 mL/hr over 60 Minutes Intravenous Every 8 hours 02/02/15 1804 02/06/15 1051   01/31/15 1600  vancomycin (VANCOCIN) IVPB 750 mg/150 ml premix  Status:  Discontinued     750 mg 150 mL/hr over 60 Minutes Intravenous Every 36 hours February 13, 2015 0516 02/13/2015 1228   01/31/15 0400  vancomycin (VANCOCIN) IVPB 750 mg/150 ml premix  Status:  Discontinued     750 mg 150 mL/hr over 60 Minutes Intravenous Every 24 hours 13-Feb-2015 1228 02/05/15 1148   02-13-15 0600  piperacillin-tazobactam (ZOSYN) IVPB 3.375 g  Status:  Discontinued     3.375 g 12.5 mL/hr over 240 Minutes Intravenous 3 times per day 2015/02/13 0516 02/05/15 1148   02-13-2015 0345  vancomycin (VANCOCIN) IVPB 1000 mg/200 mL premix     1,000 mg 200 mL/hr over 60 Minutes  Intravenous  Once February 13, 2015 0331 02-13-15 0506   02-13-2015 0345  piperacillin-tazobactam (ZOSYN) IVPB 3.375 g  Status:  Discontinued     3.375 g 12.5 mL/hr over 240 Minutes Intravenous  Once 2015/02/13 0331 Feb 13, 2015 0745      Medications   Scheduled Meds: . antiseptic oral rinse  7 mL Mouth Rinse BID  . calcium carbonate  200 mg of elemental calcium Oral BID  . clotrimazole  10 mg Oral 5 X Daily  . enoxaparin (LOVENOX) injection  40 mg Subcutaneous Q24H  . insulin aspart  0-10 Units Subcutaneous TID WC  . insulin aspart  0-6 Units Subcutaneous TID WC  . insulin glargine  7 Units Subcutaneous QHS  . levothyroxine  100 mcg Oral QAC breakfast  . metroNIDAZOLE  500 mg Oral 3 times per day  . ofloxacin  1 drop Both Eyes QID  . sodium bicarbonate  650 mg Oral TID  . sodium chloride  3 mL Intravenous Q12H   Continuous Infusions:   PRN Meds:.acetaminophen **OR** acetaminophen, ALPRAZolam, calcium carbonate, ipratropium-albuterol, ondansetron **OR** ondansetron (ZOFRAN) IV, sodium chloride, sodium chloride   Data Review:   Micro Results Recent Results (from the past 240 hour(s))  C difficile quick scan w PCR reflex     Status: Abnormal   Collection Time: 02/02/15  4:12 PM  Result Value Ref Range Status   C Diff antigen POSITIVE (A) NEGATIVE Final   C Diff toxin POSITIVE (A) NEGATIVE Final   C Diff interpretation   Final    Positive for toxigenic C. difficile, active toxin production present.    Comment: CRITICAL RESULT CALLED TO, READ BACK BY AND VERIFIED WITH: CHRISTINA MILES ON 02/02/15 AT 1715 BY [facility]     Radiology Reports Dg Chest 1 View  02/01/2015   CLINICAL DATA:  69 year old female admitted to the hospital on 2015/02/13 for hypotension. New  onset of wheezing.  EXAM: CHEST  1 VIEW  COMPARISON:  Chest x-ray 01/31/2015.  FINDINGS: There is a right-sided internal jugular central venous catheter with tip terminating in the distal superior vena cava. Lung volumes are low. Patchy  multifocal asymmetrically distributed interstitial and airspace disease, most evident throughout the mid to lower lungs bilaterally (left greater than right). Small bilateral pleural effusions (left greater than right). Pulmonary vasculature does not appear engorged. Heart size left is normal. The patient is rotated to the left on today's exam, resulting in distortion of the mediastinal contours and reduced diagnostic sensitivity and specificity for mediastinal pathology. Atherosclerosis in the thoracic aorta.  IMPRESSION: 1. Interval development of asymmetrically distributed patchy multifocal interstitial and airspace disease in the lungs bilaterally (left greater than right), concerning for multilobar pneumonia. 2. Small bilateral pleural effusions (left greater than right). 3. Atherosclerosis.   Electronically Signed   By: Trudie Reed M.D.   On: 02/01/2015 11:45   Dg Chest 2 View  01/19/2015   CLINICAL DATA:  Status post left fourth through sixth rib fractures 01/17/2015 after a fall. Vomiting and diarrhea today. Subsequent encounter.  EXAM: CHEST  2 VIEW  COMPARISON:  Plain films of the chest and left ribs 01/17/2015. PA and lateral chest 05/15/2013. CT chest 01/06/2010.  FINDINGS: Left fourth through seventh rib fractures are noted. No new fracture is identified. The lungs are clear. No pneumothorax or pleural effusion is identified. Heart size is normal. Aortic atherosclerosis is noted.  IMPRESSION: Left fourth through seventh rib fractures. Negative for pneumothorax or acute cardiopulmonary disease.   Electronically Signed   By: Drusilla Kanner M.D.   On: 01/19/2015 14:14   Dg Ribs Unilateral W/chest Left  01/17/2015   CLINICAL DATA:  Status post fall today complaining of left posterior rib pain.  EXAM: LEFT RIBS AND CHEST - 3+ VIEW  COMPARISON:  May 15, 2013  FINDINGS: There are displaced fractures of the left fourth, fifth, sixth, seventh ribs. There questioned fractures of the left second  and third ribs. There is no pneumothorax. The lungs are clear. The mediastinal contour and cardiac silhouette are normal.  IMPRESSION: Fractures of left ribs as described.   Electronically Signed   By: Sherian Rein M.D.   On: 01/17/2015 16:53   Dg Thoracic Spine 2 View  01/17/2015   CLINICAL DATA:  Patient status post fall while walking up the stairs. Left posterior rib and thoracic spine pain worse when breathing.  EXAM: THORACIC SPINE 2 VIEWS  COMPARISON:  Chest radiograph 05/15/2013  FINDINGS: Superior thoracic vertebral bodies are not well visualized. The mid and lower thoracic vertebral bodies are visualized and appear in normal height with normal intervertebral disc spaces. There is rightward curvature of the proximal thoracic spine demonstrated on the AP view. Multiple left-sided rib fractures, incompletely evaluated on current exam.  IMPRESSION: Rightward curvature of the proximal thoracic spine. Recommend correlation for point tenderness at this location.  Otherwise relative preservation the vertebral body heights.  Incompletely visualized probable left rib fractures. See dedicated chest and rib report.   Electronically Signed   By: Annia Belt M.D.   On: 01/17/2015 16:52   Ct Head Wo Contrast  01/31/2015   CLINICAL DATA:  Lethargy and decreased responsiveness today.  EXAM: CT HEAD WITHOUT CONTRAST  TECHNIQUE: Contiguous axial images were obtained from the base of the skull through the vertex without intravenous contrast.  COMPARISON:  CT head without contrast 01/19/2015. MRI brain 08/15/2009.  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 No acute infarct, hemorrhage, or mass lesion is otherwise present. The ventricles are of normal size. No significant extra-axial fluid collection is present. The basal ganglia are intact. The insular ribbon is intact.  The paranasal sinuses and mastoid air cells are clear.  IMPRESSION: 1. No acute intracranial abnormality  or significant interval change. 2. 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   Electronically Signed   By: Marin Roberts M.D.   On: 01/31/2015 16:26   Ct Head Wo Contrast  01/19/2015   CLINICAL DATA:  Intractable vomiting over the last few days.  EXAM: CT HEAD WITHOUT CONTRAST  TECHNIQUE: Contiguous axial images were obtained from the base of the skull through the vertex without intravenous contrast.  COMPARISON:  Head CT 01/17/2015  FINDINGS: Brainstem is normal. Chronically known 1 cm lesion of the left cerebellum is again visible, consistent with a chronic cavernous angioma. Some adjacent brain low density is present as has been seen previously. No sign that there is any active process in this region. The cerebral hemispheres are normal. No evidence of old or acute infarction, mass lesion, hemorrhage, hydrocephalus or extra-axial collection. Chronic benign left calvarial lucency likely related to a hemangioma. No acute calvarial finding. Sinuses, middle ears and mastoids are clear.  IMPRESSION: No acute intracranial finding. 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 Based on the unchanged appearance, this would be on likely to relate to the clinical presentation.   Electronically Signed   By: Paulina Fusi M.D.   On: 01/19/2015 13:31   Ct Head Wo Contrast  01/17/2015   CLINICAL DATA:  Larey Seat downstairs today.  Hit head.  EXAM: CT HEAD WITHOUT CONTRAST  CT CERVICAL SPINE WITHOUT CONTRAST  TECHNIQUE: Multidetector CT imaging of the head and cervical spine was performed following the standard protocol without intravenous contrast. Multiplanar CT image reconstructions of the cervical spine were also generated.  COMPARISON:  Head CT 08/14/2009  FINDINGS: CT HEAD FINDINGS  The ventricles are normal in size and configuration. No extra-axial fluid collections are identified. The gray-white differentiation is normal. No CT findings for  acute intracranial process such as hemorrhage or infarction. No mass lesions. The brainstem and cerebellum are grossly normal.  The bony structures are intact. The paranasal sinuses and mastoid air cells are clear. The globes are intact.  CT CERVICAL SPINE FINDINGS  Degenerative cervical spondylosis with mild multilevel disc disease and facet disease. The cervical vertebral bodies are normally aligned. No acute fracture. The facets are normally aligned. Moderate C1-2 degenerative changes but no dens fracture. The spinal canal is quite generous. No spinal or significant foraminal stenosis. The lung apices are clear.  IMPRESSION: 1. No acute intracranial findings or skull fracture. 2. Mild degenerative cervical spondylosis but no acute cervical spine fracture.   Electronically Signed   By: Rudie Meyer M.D.   On: 01/17/2015 15:21   Ct Cervical Spine Wo Contrast  01/17/2015   CLINICAL DATA:  Larey Seat downstairs today.  Hit head.  EXAM: CT HEAD WITHOUT CONTRAST  CT CERVICAL SPINE WITHOUT CONTRAST  TECHNIQUE: Multidetector CT imaging of the head and cervical spine was performed following the standard protocol without intravenous contrast. Multiplanar CT image reconstructions of the cervical spine were also generated.  COMPARISON:  Head CT 08/14/2009  FINDINGS: CT HEAD FINDINGS  The ventricles are normal in size and configuration. No extra-axial fluid collections are identified. The gray-white differentiation is normal.  No CT findings for acute intracranial process such as hemorrhage or infarction. No mass lesions. The brainstem and cerebellum are grossly normal.  The bony structures are intact. The paranasal sinuses and mastoid air cells are clear. The globes are intact.  CT CERVICAL SPINE FINDINGS  Degenerative cervical spondylosis with mild multilevel disc disease and facet disease. The cervical vertebral bodies are normally aligned. No acute fracture. The facets are normally aligned. Moderate C1-2 degenerative changes  but no dens fracture. The spinal canal is quite generous. No spinal or significant foraminal stenosis. The lung apices are clear.  IMPRESSION: 1. No acute intracranial findings or skull fracture. 2. Mild degenerative cervical spondylosis but no acute cervical spine fracture.   Electronically Signed   By: Rudie Meyer M.D.   On: 01/17/2015 15:21   US Renal  02/05/2015   CLINICAL DATA:  Acute renal failure  EXAM: RENAL / URINARY TRACT ULTRASOUND COMPLETE  COMPARISON:  None.  FINDINGS: Right Kidney:  Length: 8.4 cm. Echogenicity within normal limits. No mass or hydronephrosis visualized.  Left Kidney:  Length: 9.1 cm. Echogenicity within normal limits. No mass or hydronephrosis visualized.  Bladder:  Appears normal for degree of bladder distention.  Note is made of mild ascites.  IMPRESSION: Mild ascites.  Normal-appearing kidneys.   Electronically Signed   By: Alcide Clever M.D.   On: 02/05/2015 12:36   Dg Chest Port 1 View  01/31/2015   Ricarda Frame, MD     01/31/2015  1:33 AM Central Venous Catheter Insertion Procedure Note MATHA MASSE [id number] 1945-09-14  Procedure: Insertion of Central Venous Catheter Indications: Drug and/or fluid administration  Procedure Details Consent: Risks of procedure as well as the alternatives and risks  of each were explained to the (patient/caregiver).  Consent for  procedure obtained. Time Out: Verified patient identification, verified procedure,  site/side was marked, verified correct patient position, special  equipment/implants available, medications/allergies/relevent  history reviewed, required imaging and test results available.   Performed  Maximum sterile technique was used including antiseptics, cap,  gloves, gown, hand hygiene, mask and sheet. Skin prep: Chlorhexidine; local anesthetic administered A antimicrobial bonded/coated triple lumen catheter was placed in  the right internal jugular vein using the Seldinger technique.  Evaluation Blood flow good  Complications: No apparent complications Patient did tolerate procedure well. Chest X-ray ordered to verify placement.  CXR: pending.  Ricarda Frame 01/31/2015, 1:32 AM    Dg Chest Port 1 View  01-Mar-2015   CLINICAL DATA:  Acute onset of generalized weakness. Initial encounter.  EXAM: PORTABLE CHEST - 1 VIEW  COMPARISON:  Chest radiograph performed 01/19/2015  FINDINGS: The lungs are well-aerated. Minimal bilateral atelectasis is noted. There is no evidence of pleural effusion or pneumothorax.  The cardiomediastinal silhouette is within normal limits. No acute osseous abnormalities are seen.  IMPRESSION: Minimal bilateral atelectasis noted.  Lungs otherwise clear.   Electronically Signed   By: Roanna Raider M.D.   On: 03/01/2015 02:33   Ct Renal Stone Study  01/19/2015   CLINICAL DATA:  Severe vomiting and hypotension.  EXAM: CT ABDOMEN AND PELVIS WITHOUT CONTRAST  TECHNIQUE: Multidetector CT imaging of the abdomen and pelvis was performed following the standard protocol without IV contrast.  COMPARISON:  Abdomen CT 07/05/2005  FINDINGS: Lower chest: No acute findings.  Hepatobiliary: No mass visualized on this unenhanced exam. Gallbladder is unremarkable.  Pancreas: No mass or inflammatory process visualized on this unenhanced exam. Diffuse fatty replacement of pancreas noted.  Spleen:  Within normal  limits in size.  Adrenal Glands:  No masses identified.  Kidneys/Urinary tract: No evidence of urolithiasis or hydronephrosis.  Stomach/Bowel/Peritoneum: Sigmoid diverticulosis is noted. No evidence of diverticulitis or other inflammatory process. Normal appendix visualized.  Vascular/Lymphatic: No pathologically enlarged lymph nodes identified. No abdominal aortic aneurysm or other significant retroperitoneal abnormality demonstrated.  Reproductive:  No mass or other significant abnormality noted.  Other:  None.  Musculoskeletal:  No suspicious bone lesions identified.  IMPRESSION: Sigmoid diverticulosis. No  radiographic evidence of diverticulitis or other acute findings.   Electronically Signed   By: Myles Rosenthal M.D.   On: 01/19/2015 14:14     CBC  Recent Labs Lab 02/03/15 0429 02/04/15 0455 02/05/15 0514 02/08/15 0459  WBC 19.7* 16.7* 18.0* 11.4*  HGB 8.3* 9.1* 9.0* 8.8*  HCT 24.7* 26.8* 26.8* 27.1*  PLT 243 259 262 194  MCV 82.6 84.0 83.0 85.7  MCH 27.6 28.5 27.8 27.8  MCHC 33.5 34.0 33.5 32.4  RDW 14.2 14.4 14.5 14.6*    Chemistries   Recent Labs Lab 02/05/15 0514 02/06/15 0427 02/07/15 0450 02/08/15 0459 02/21/2015 0507  NA 133* 134* 134* 136 137  K 3.3* 3.4* 3.3* 4.0 4.2  CL 110 110 111 113* 112*  CO2 18* 16* 20* 20* 22  GLUCOSE 145* 230* 229* 253* 121*  BUN 15 16 15 12 11   CREATININE 1.05* 1.08* 0.79 0.69 0.73  CALCIUM 7.5* 7.7* 7.4* 7.7* 7.8*  MG 1.8 1.6* 1.9 1.6* 1.7   ------------------------------------------------------------------------------------------------------------------ estimated creatinine clearance is 61.1 mL/min (by C-G formula based on Cr of 0.73). ------------------------------------------------------------------------------------------------------------------ No results for input(s): HGBA1C in the last 72 hours. ------------------------------------------------------------------------------------------------------------------ No results for input(s): CHOL, HDL, LDLCALC, TRIG, CHOLHDL, LDLDIRECT in the last 72 hours. ------------------------------------------------------------------------------------------------------------------  Recent Labs  02/08/15 0459  TSH 5.985*   ------------------------------------------------------------------------------------------------------------------ No results for input(s): VITAMINB12, FOLATE, FERRITIN, TIBC, IRON, RETICCTPCT in the last 72 hours.  Coagulation profile No results for input(s): INR, PROTIME in the last 168 hours.  No results for input(s): DDIMER in the last 72 hours.  Cardiac Enzymes No  results for input(s): CKMB, TROPONINI, MYOGLOBIN in the last 168 hours.  Invalid input(s): CK ------------------------------------------------------------------------------------------------------------------ Invalid input(s): POCBNP    Assessment & Plan   #1 .DKA (diabetic ketoacidoses) -resolved. Transition from insulin drip to insulin pump, subcutaneous insulin. Appreciate endocrinology following. Also started on Lantus.  But her po intake  Is poor. #2 Dehydration ;improved #3 septic shock:due to cdiff colitis. /pneumonia;Continue Flagyl..  hypothermia ; improving. Patient blood cultures urine cultures are repeated. Follow the results. TSH is 5.9 at just  thyroid medicine. 4.pneumonia he already finished 7 days of IV vancomycin and Zosyn.stopped vanco and zosyn.  5,. Acute renal failure with hyponatremia: Improved with fluids. Does have a metabolic acidosis started on bicarbonate tablets by nephrology. Patient nausea is improved.. Bicarbonate also improved.  6.Acute renal  failure on chronic kidney disease stage III: Acute renal failure disease from diabetic ketoacidosis, infective colitis, hypovolemia. All this is improving.  #5.Recent ribs fracture- when necessary pain control, incentive spirometry try to avoid narcotics which may be playing a role in her lethargy and weakness  #6 Hypothyroidism - continue thyroid replacement.  7.acute respiratory failure due to bilateral pneumonia; improved.Oxygen. Finished IV vancomycin and Zosyn for 7 days. dis continue steroids at this time.  #8. Severe  Malnutrition;due to  her acute illness started on d  seen by RD, continue Carnation instant breakfast   9. CODE STATUS DO NOT RESUSCITATE. Discussed this with patient's daughter  did express that patient wants to be DO NOT RESUSCITATE.  #10. Patient has edema, diffuse decreased breath sounds bilaterally. Chest x-ray stat, and one small dose of Lasix 40 mg once. ccu stepdown status  today.     Code Status Orders        Start     Ordered   02-05-15 0505  Full code   Continuous     2015/02/05 0504           Consults endocrinology   DVT Prophylaxis  heparin  Lab Results  Component Value Date   PLT 194 02/08/2015     Time Spent in minutes  45 minutes of critical care time patient still on insulin drip blood glucose and needs to be monitored closely Sereena Marando M.D on 02/19/2015 at 8:53 AM  Between 7am to 6pm - Pager - [phone]  After 6pm go to [email or web address] - password [email or web address] [email or web address]  [facility]   Office  [phone]  Chest xray showed Pneumothorax on left side,I have discussed the case with DR.Mungal and DR.Bird.PT is taken to OR for chest tube insertion,

## 2015-02-09 NOTE — Anesthesia Procedure Notes (Signed)
Procedures MAC Sedation for CT placement

## 2015-02-09 NOTE — Op Note (Signed)
01/31/2015  3:08 PM  PATIENT:  [name]  69 y.o. female  PRE-OPERATIVE DIAGNOSIS:  Left pneumothorax  POST-OPERATIVE DIAGNOSIS:  Same  PROCEDURE:  Procedure(s): CHEST TUBE INSERTION (N/A) Left side SURGEON:  Surgeon(s) and Role:    Natale Lay, MD - Primary   ANESTHESIA: Ketamine and propofol by anesthesia services, 20 cc of 0.25% plain Marcaine.  SPECIMEN:none  EBL: minimal  Findings: Small amount of air and clear pleural fluid.  Description of procedure:    With informed consent obtained from the patient's daughters supine. Intravenous sedation was administered with the assistance of anesthesia.   The patient's left chest was sterilely prepped and draped with chlor prep. Timeout was observed. A total of 20 cc of 0.25% plain Marcaine was infiltrated along the left mid axillary line. At approximate the sixth interspace below the inframammary crease. Small transverse skin incision was fashion with scalpel. This was widened with blunt technique. The superior border of the ribs was identified. The chest cavity was entered bluntly. Large rush of fluid and air was encountered. 24 French straight chest tube was directed and secured at 16 cm with multiple silk suture. Tube was placed to an atrium suction canister. Sterile dressing was applied. Chest x-ray was obtained. Patient on the procedure well.

## 2015-02-09 NOTE — Progress Notes (Signed)
Pharmacy Consult for Electrolyte Management   No Known Allergies  Patient Measurements: Height:  (154.9 cm) Weight: 154 lb 5.2 oz (70 kg) IBW/kg (Calculated) : 47.8  Vital Signs: Temp: 95.5 F (35.3 C) (09/11 0600) Temp Source: Rectal (09/11 0600) BP: 100/54 mmHg (09/11 0600) Pulse Rate: 82 (09/11 0600) Intake/Output from previous day: 09/10 0701 - 09/11 0700 In: 435 [P.O.:210; I.V.:175; IV Piggyback:50] Out: -  Intake/Output from this shift: Total I/O In: 120 [P.O.:120] Out: -   Labs:  Recent Labs  02/08/15 0459  WBC 11.4*  HGB 8.8*  HCT 27.1*  PLT 194    Recent Labs  02/07/15 0450 02/08/15 0459 02/08/2015 0507  NA 134* 136 137  K 3.3* 4.0 4.2  CL 111 113* 112*  CO2 20* 20* 22  GLUCOSE 229* 253* 121*  BUN CREATININE 0.79 0.69 0.73  CALCIUM 7.4* 7.7* 7.8*  MG 1.9 1.6* 1.7  PHOS 2.5 3.3 3.2  ALBUMIN  --  1.7*  --    Estimated Creatinine Clearance: 61.1 mL/min (by C-G formula based on Cr of 0.73).    Recent Labs  02/08/15 1720 02/08/15 2102 02/08/15 2350  GLUCAP 147* 206* 190*    Medical History: Past Medical History  Diagnosis Date  . Diabetes mellitus type 1     (prior saw Dr. Lodema Hong endo, would like to establish locally)  . Generalized headaches     frequent  . Hypothyroidism   . Convulsions/seizures     unknown type, nml EEG  . History of chicken pox   . Anemia   . Vitamin B12 deficiency 03/2012    normal IF    Medications:  Scheduled:  . antiseptic oral rinse  7 mL Mouth Rinse BID  . calcium carbonate  200 mg of elemental calcium Oral BID  . clotrimazole  10 mg Oral 5 X Daily  . enoxaparin (LOVENOX) injection  40 mg Subcutaneous Q24H  . insulin aspart  0-10 Units Subcutaneous TID WC  . insulin aspart  0-6 Units Subcutaneous TID WC  . insulin glargine  7 Units Subcutaneous QHS  . levothyroxine  100 mcg Oral QAC breakfast  . metroNIDAZOLE  500 mg Oral 3 times per day  . ofloxacin  1 drop Both Eyes QID  . sodium  bicarbonate  650 mg Oral TID  . sodium chloride  3 mL Intravenous Q12H   Infusions:    PRN: acetaminophen **OR** acetaminophen, ALPRAZolam, calcium carbonate, ipratropium-albuterol, ondansetron **OR** ondansetron (ZOFRAN) IV, sodium chloride, sodium chloride  Assessment: Phamracy consulted to assist in managing electrolytes in this 69 y/o F with DKA.   Plan:  Phosphorus and magnesium are low so will replace with magnesium sulfate 2 g iv once and sodium phosphate 10 mmol iv once. Will recheck electrolytes with AM labs.  Pharmacy to follow per consult.   9/7 Potassium 3.4 and Calcium 8.2 corrected. Will order KCl 20 mEq IV and 1 gram calcium gluconate IV. F/u labs in AM.  9/8 Potassium 3.4, Mg 1.6, and Ca 7.7. KCl 20 mEq, calcium gluconate 1 gram and magnesium sulfate 2 grams ordered. F/u labs in AM.  9/9 Potassium 3.3 and calcium 7.4. KCl 40 mEq and calcium gluconate 2 grams x1 ordered. F/u labs in AM.  9/10 Electrolytes WNL except Mg 1.6. Corrected calcium 9.5 for albumin of 1.7. Magnesium 2 grams ordered. Recheck in AM.  9/11 Electrolytes WNL, including corrected calcium.Hennie Duos labs in AM.  Erich Montane, [facility] Clinical Pharmacist   02/19/2015,6:32  AM

## 2015-02-09 NOTE — Progress Notes (Signed)
Dr. Luberta Mutter notified that patient has a spontaneous pneumothorax. Will continue to assess and monitor.

## 2015-02-09 NOTE — Anesthesia Postprocedure Evaluation (Addendum)
  Anesthesia Post-op Note  Patient: [name]   Anesthesia type:General, MAC  Patient location: PACU  Post pain: Pain level controlled  Post assessment: Post-op Vital signs reviewed, Patient's Cardiovascular Status Stable, Respiratory Function Stable, Patent Airway and No signs of Nausea or vomiting  Post vital signs: Reviewed and stable  Last Vitals:  Filed Vitals:   02/18/2015 1000  BP: 99/57  Pulse: 81  Temp: 35.3 C  Resp: 34    Level of consciousness: awake, alert  and patient cooperative  Complications: No apparent anesthesia complications

## 2015-02-09 NOTE — Anesthesia Preprocedure Evaluation (Signed)
Anesthesia Evaluation  Patient identified by MRN, date of birth, ID band Patient awake    Reviewed: Allergy & Precautions, H&P , NPO status , Patient's Chart, lab work & pertinent test results, reviewed documented beta blocker date and time   Airway Mallampati: II  TM Distance: >3 FB Neck ROM: full    Dental no notable dental hx.    Pulmonary neg pulmonary ROS,    Pulmonary exam normal breath sounds clear to auscultation       Cardiovascular Exercise Tolerance: Good negative cardio ROS Normal cardiovascular exam Rhythm:regular Rate:Normal     Neuro/Psych  Headaches, Seizures -,  negative neurological ROS  negative psych ROS   GI/Hepatic negative GI ROS, Neg liver ROS,   Endo/Other  negative endocrine ROSdiabetesHypothyroidism   Renal/GU Renal diseasenegative Renal ROS  negative genitourinary   Musculoskeletal   Abdominal   Peds  Hematology negative hematology ROS (+) anemia ,   Anesthesia Other Findings Pt with spont pneumothorax with request by general surgeon to assist with chest tube placement.  We have been requested to provide sedation at bedside for the above chest tube.   Reproductive/Obstetrics negative OB ROS                             Anesthesia Physical Anesthesia Plan  ASA: III and emergent  Anesthesia Plan: General and MAC   Post-op Pain Management:    Induction:   Airway Management Planned:   Additional Equipment:   Intra-op Plan:   Post-operative Plan:   Informed Consent: I have reviewed the patients History and Physical, chart, labs and discussed the procedure including the risks, benefits and alternatives for the proposed anesthesia with the patient or authorized representative who has indicated his/her understanding and acceptance.   Dental Advisory Given  Plan Discussed with: CRNA  Anesthesia Plan Comments:         Anesthesia Quick Evaluation

## 2015-02-09 NOTE — Progress Notes (Addendum)
Endocrinology note:   Glycemic control was good yesterday without any low BG on Lantus. PO intake has been very poor. Spoke with pt's RN this am. Patient remains lethargic and not eating. Lantus was mistakenly given twice yesterday evening and night due to confusion about dose timing. She has not had documented hypoglycemia. BG trend reviewed. She now is volume overloaded and CXR also revealed pneumothorax. Clinically she is doing poorly.   Recommendations: She is at risk of hypoglycemia. Would check BG NOW and every 3 hours and treat per hypoglycemia protocol. If hypoglycemia is an issue over course of today, D10 infusion at 30-50 cc/hr can be started with diuresis.  Would hold any novolog unless she eats a full meal. Discussed above with her RN. Please page with any questions or concerns.  Doylene Canning, MD [facility] Endocrinology

## 2015-02-09 NOTE — Transfer of Care (Signed)
Immediate Anesthesia Transfer of Care Note  Patient: [name]  Procedure(s) Performed: * No procedures listed *  Patient Location: PACU and ICU  Anesthesia Type:MAC  Level of Consciousness: sedated  Airway & Oxygen Therapy: Patient Spontanous Breathing  Post-op Assessment: Report given to RN and Post -op Vital signs reviewed and stable  Post vital signs: stable  Last Vitals:  Filed Vitals:   02/14/2015 1000  BP: 99/57  Pulse: 81  Temp: 35.3 C  Resp: 34    Complications: No apparent anesthesia complications

## 2015-02-09 NOTE — Consult Note (Signed)
Surgery.  I was contacted by [facility] regarding some respiratory distress a chest x-ray was obtained demonstrating a left spontaneous pneumothorax. The patient has no prior history of spontaneous pneumothoraces. I do not see that a line was attempted on that side. There has been significant coughing. She's been very lethargic. In fact the patient has been given Provigil to help stimulate her awake fullness. Present she is in no obvious respiratory distress although somewhat but completely  Unarousable.  On examination she is lethargic. Vital signs are stable. She is on nasal cannula oxygen. She is not to be hypoxic. Her abdomen is soft. Lung sounds are diminished on the left side. There is no obvious subcutaneous emphysema present.  CBC Latest Ref Rng 02/08/2015 02/05/2015 02/04/2015  WBC 3.6 - 11.0 K/uL 11.4(H) 18.0(H) 16.7(H)  Hemoglobin 12.0 - 16.0 g/dL 1.6(X) 0.9(U) 0.4(V)  Hematocrit 35.0 - 47.0 % 27.1(L) 26.8(L) 26.8(L)  Platelets 150 - 440 K/uL 194 262 259    BMP Latest Ref Rng 02/11/2015 02/08/2015 02/07/2015  Glucose 65 - 99 mg/dL 409(W) 119(J) 478(G)  BUN 6 - 20 mg/dL Creatinine 0.44 - 1.00 mg/dL 9.56 2.13 0.86  Sodium 135 - 145 mmol/L 137 136 134(L)  Potassium 3.5 - 5.1 mmol/L 4.2 4.0 3.3(L)  Chloride 101 - 111 mmol/L 112(H) 113(H) 111  CO2 22 - 32 mmol/L 22 20(L) 20(L)  Calcium 8.9 - 10.3 mg/dL 7.8(L) 7.7(L) 7.4(L)    Review of chest x-ray demonstrates a 20% left-sided pneumothorax with effusion present. There is no midline shift. There is a right pleural effusion.   Impression spontaneous left pneumothorax. Severe type 1 diabetes with resolved the DKA and sepsis.  Plan left chest tube with fluoroscopy and main operating room given the patient's degree of lethargy.

## 2015-02-09 NOTE — Consult Note (Addendum)
PULMONARY / CRITICAL CARE MEDICINE   Name: [name] MRN: [id number] DOB: 1945-06-25    ADMISSION DATE:  01/31/2015 CONSULTATION DATE:  02/23/2015  REFERRING MD :  Dr. Vianne Bulls   REASON FOR CONSULT/CHIEF COMPLAINT:     Hypotension/Spontaneous pneumothorax   HISTORY OF PRESENT ILLNESS    69 year old female presented with hypertension on 02/28/2015 after a recent fall and broke several ribs, noted to have DKA at that time, recently readmitted for DKA, had several coughing fits and feeling very lethargic, chest x-ray today showed small left-sided spontaneous pneumothorax, pulmonary was called for further evaluation. Review of chart shows that patient hospitalization complicated by hypothermia with C. difficile colitis, she had been on pressors and IV fluids for hypotension, this is now weaned off. In the room the patient is appropriate, with no complaints of significant shortness of breath or chest pain.    PAST MEDICAL HISTORY    :  Past Medical History  Diagnosis Date  . Diabetes mellitus type 1     (prior saw Dr. Moshe Cipro endo, would like to establish locally)  . Generalized headaches     frequent  . Hypothyroidism   . Convulsions/seizures     unknown type, nml EEG  . History of chicken pox   . Anemia   . Vitamin B12 deficiency 03/2012    normal IF   Past Surgical History  Procedure Laterality Date  . Cesarean section  1983   Prior to Admission medications   Medication Sig Start Date End Date Taking? Authorizing Provider  Calcium Carbonate-Vit D-Min (CALCIUM/VITAMIN D/MINERALS) 600-200 MG-UNIT TABS Take 1 tablet by mouth daily.   Yes Historical Provider, MD  cyanocobalamin 100 MCG tablet Take 100 mcg by mouth daily.   Yes Historical Provider, MD  nystatin (MYCOSTATIN) 100000 UNIT/ML suspension Take 10 mLs by mouth 4 (four) times daily.   Yes Historical Provider, MD  ondansetron (ZOFRAN) 4 MG tablet Take 8 mg by mouth every 8 (eight) hours as needed for nausea or  vomiting.   Yes Historical Provider, MD  tuberculin 5 UNIT/0.1ML injection Inject 5 Units into the skin every 14 (fourteen) days.   Yes Historical Provider, MD  Blood Glucose Monitoring Suppl (ONE TOUCH ULTRA 2) W/DEVICE KIT Every 4 hours 07/19/14   Historical Provider, MD  insulin lispro (HUMALOG) 100 UNIT/ML injection Inject 3 Units into the skin every 4 (four) hours.  12/11/14   Historical Provider, MD  levothyroxine (SYNTHROID, LEVOTHROID) 100 MCG tablet Take 100 mcg by mouth daily before breakfast. 01/02/15   Historical Provider, MD  loratadine (CLARITIN) 10 MG tablet Take 10 mg by mouth daily.    Historical Provider, MD  traMADol (ULTRAM) 50 MG tablet Take 1 tablet (50 mg total) by mouth every 12 (twelve) hours as needed for moderate pain. 01/23/15   Fritzi Mandes, MD   No Known Allergies   FAMILY HISTORY   Family History  Problem Relation Age of Onset  . Stroke Mother   . Hypertension Mother   . Hypertension Father   . Coronary artery disease Father 52    MI  . Hypertension Sister   . Diabetes Cousin     type 1  . Cancer Maternal Aunt     brain  . Hyperlipidemia Brother       SOCIAL HISTORY    reports that she has never smoked. She has never used smokeless tobacco. She reports that she drinks about 0.6 oz of alcohol per week. She reports that she does not use  illicit drugs.  Review of Systems  Constitutional: Positive for malaise/fatigue. Negative for fever, chills and diaphoresis.  HENT: Negative for congestion, hearing loss and sore throat.   Eyes: Negative for double vision.  Respiratory: Positive for cough. Negative for hemoptysis, sputum production and stridor.   Cardiovascular: Negative for chest pain.  Gastrointestinal: Negative for heartburn.  Genitourinary: Negative for dysuria.  Musculoskeletal: Negative for myalgias.  Skin: Negative for rash.  Neurological: Positive for weakness. Negative for tingling and headaches.      VITAL SIGNS    Temp:  [93.7 F  (34.3 C)-96.4 F (35.8 C)] 95.5 F (35.3 C) (09/11 1000) Pulse Rate:  [81-92] 81 (09/11 1000) Resp:  [22-41] 34 (09/11 1000) BP: (73-118)/(45-68) 99/57 mmHg (09/11 1000) SpO2:  [95 %-100 %] 96 % (09/11 1000) Weight:  [154 lb 5.2 oz (70 kg)] 154 lb 5.2 oz (70 kg) (09/11 0513) HEMODYNAMICS:   VENTILATOR SETTINGS:   INTAKE / OUTPUT:  Intake/Output Summary (Last 24 hours) at 02/05/2015 1126 Last data filed at 02/26/2015 0800  Gross per 24 hour  Intake    540 ml  Output      0 ml  Net    540 ml       PHYSICAL EXAM   Physical Exam  Constitutional: She appears well-developed.  HENT:  Head: Normocephalic.  Eyes: Pupils are equal, round, and reactive to light.  Neck: Normal range of motion.  Cardiovascular: Normal rate, regular rhythm and normal heart sounds.   Pulmonary/Chest: Effort normal. No respiratory distress. She has no rales. She exhibits no tenderness.  Faint expiratory wheezes  Abdominal: Soft.  Musculoskeletal: Normal range of motion.  Neurological: She is alert.  Somnolent at time but easily arousable.   Skin: Skin is warm and dry.  Nursing note and vitals reviewed.      LABS   LABS:  CBC  Recent Labs Lab 02/04/15 0455 02/05/15 0514 02/08/15 0459  WBC 16.7* 18.0* 11.4*  HGB 9.1* 9.0* 8.8*  HCT 26.8* 26.8* 27.1*  PLT 259 262 194   Coag's No results for input(s): APTT, INR in the last 168 hours. BMET  Recent Labs Lab 02/07/15 0450 02/08/15 0459 02/24/2015 0507  NA 134* 136 137  K 3.3* 4.0 4.2  CL 111 113* 112*  CO2 20* 20* 22  BUN '15 12 11  ' CREATININE 0.79 0.69 0.73  GLUCOSE 229* 253* 121*   Electrolytes  Recent Labs Lab 02/07/15 0450 02/08/15 0459 02/25/2015 0507  CALCIUM 7.4* 7.7* 7.8*  MG 1.9 1.6* 1.7  PHOS 2.5 3.3 3.2   Sepsis Markers No results for input(s): LATICACIDVEN, PROCALCITON, O2SATVEN in the last 168 hours. ABG No results for input(s): PHART, PCO2ART, PO2ART in the last 168 hours. Liver Enzymes  Recent Labs Lab  02/08/15 0459  ALBUMIN 1.7*   Cardiac Enzymes No results for input(s): TROPONINI, PROBNP in the last 168 hours. Glucose  Recent Labs Lab 02/08/15 0712 02/08/15 1053 02/08/15 1720 02/08/15 2102 02/08/15 2350 02/08/2015 0741  GLUCAP 175* 171* 147* 206* 190* 88     Recent Results (from the past 240 hour(s))  C difficile quick scan w PCR reflex     Status: Abnormal   Collection Time: 02/02/15  4:12 PM  Result Value Ref Range Status   C Diff antigen POSITIVE (A) NEGATIVE Final   C Diff toxin POSITIVE (A) NEGATIVE Final   C Diff interpretation   Final    Positive for toxigenic C. difficile, active toxin production present.    Comment:  CRITICAL RESULT CALLED TO, READ BACK BY AND VERIFIED WITH: CHRISTINA MILES ON 02/02/15 AT 1715 BY [facility]   Urine culture     Status: None (Preliminary result)   Collection Time: 02/08/15 11:06 AM  Result Value Ref Range Status   Specimen Description URINE, CATHETERIZED  Final   Special Requests Normal  Final   Culture HOLDING FOR POSSIBLE PATHOGEN  Final   Report Status PENDING  Incomplete     Current facility-administered medications:  .  acetaminophen (TYLENOL) tablet 650 mg, 650 mg, Oral, Q6H PRN **OR** acetaminophen (TYLENOL) suppository 650 mg, 650 mg, Rectal, Q6H PRN, Lance Coon, MD .  ALPRAZolam Duanne Moron) tablet 0.25 mg, 0.25 mg, Oral, TID PRN, Flora Lipps, MD .  antiseptic oral rinse (CPC / CETYLPYRIDINIUM CHLORIDE 0.05%) solution 7 mL, 7 mL, Mouth Rinse, BID, Lance Coon, MD, 7 mL at 02/17/2015 1000 .  calcium carbonate (TUMS - dosed in mg elemental calcium) chewable tablet 200 mg of elemental calcium, 200 mg of elemental calcium, Oral, BID, Epifanio Lesches, MD, 200 mg of elemental calcium at 02/22/2015 1011 .  calcium carbonate (TUMS - dosed in mg elemental calcium) chewable tablet 200-400 mg of elemental calcium, 1-2 tablet, Oral, Q4H PRN, Lytle Butte, MD, 200 mg of elemental calcium at 02/07/15 1427 .  clotrimazole (MYCELEX) troche 10  mg, 10 mg, Oral, 5 X Daily, Flora Lipps, MD, 10 mg at 02/03/2015 1013 .  enoxaparin (LOVENOX) injection 40 mg, 40 mg, Subcutaneous, Q24H, Epifanio Lesches, MD, 40 mg at 02/08/15 1722 .  insulin aspart (novoLOG) injection 0-10 Units, 0-10 Units, Subcutaneous, TID WC, Abby Percell Locus, MD, 0 Units at 02/26/2015 0800 .  insulin aspart (novoLOG) injection 0-6 Units, 0-6 Units, Subcutaneous, TID WC, Abby Percell Locus, MD, 0 Units at 02/08/15 0906 .  insulin glargine (LANTUS) injection 6 Units, 6 Units, Subcutaneous, Daily, Abby Tubman Abisogun, MD .  ipratropium-albuterol (DUONEB) 0.5-2.5 (3) MG/3ML nebulizer solution 3 mL, 3 mL, Nebulization, Q4H PRN, Epifanio Lesches, MD .  Derrill Memo ON 02/10/2015] levothyroxine (SYNTHROID, LEVOTHROID) tablet 125 mcg, 125 mcg, Oral, QAC breakfast, Epifanio Lesches, MD .  metroNIDAZOLE (FLAGYL) tablet 500 mg, 500 mg, Oral, 3 times per day, Flora Lipps, MD, 500 mg at 02/07/2015 1011 .  modafinil (PROVIGIL) tablet 100 mg, 100 mg, Oral, Daily, Epifanio Lesches, MD, 100 mg at 02/04/2015 1011 .  ofloxacin (OCUFLOX) 0.3 % ophthalmic solution 1 drop, 1 drop, Both Eyes, QID, Dustin Flock, MD, 1 drop at 02/28/2015 1014 .  ondansetron (ZOFRAN) tablet 4 mg, 4 mg, Oral, Q6H PRN **OR** ondansetron (ZOFRAN) injection 4 mg, 4 mg, Intravenous, Q6H PRN, Lance Coon, MD, 4 mg at 01/31/15 2104 .  sodium bicarbonate tablet 650 mg, 650 mg, Oral, TID, Lavonia Dana, MD, 650 mg at 02/11/2015 1012 .  sodium chloride 0.9 % bolus 1,000 mL, 1,000 mL, Intravenous, PRN, Lance Coon, MD, 1,000 mL at 02/24/2015 2145 .  sodium chloride 0.9 % bolus 1,000 mL, 1,000 mL, Intravenous, PRN, Lance Coon, MD, 1,000 mL at 01/31/2015 2300 .  sodium chloride 0.9 % injection 3 mL, 3 mL, Intravenous, Q12H, Lance Coon, MD, 3 mL at 02/14/2015 1014  IMAGING    Dg Chest 1 View  02/06/2015   CLINICAL DATA:  69 year old female with a history of shortness of breath.  EXAM: CHEST  1 VIEW  COMPARISON:  02/01/2015   FINDINGS: Cardiomediastinal silhouette likely unchanged. Left heart border obscured by overlying lung/ pleural disease.  Increasing opacity at the left base with obscuration left hemidiaphragm and left  heart border. Blunting of the left greater than right cardiophrenic angle. Hazy opacity at the right base.  Interval development of left-sided pneumothorax.  Unchanged position of right IJ central catheter.  IMPRESSION: Interval development of left-sided pneumothorax.  These results were called by telephone at the time of interpretation on 02/12/2015 at 11:11 am to the nurse caring for the patient Ms Jule Ser, who verbally acknowledged these results. Ms Jule Ser  Enlarging left-sided pleural effusion with likely atelectasis. Infection cannot be excluded.  Small right pleural effusion and atelectasis.  Unchanged right IJ central catheter.  Signed,  Dulcy Fanny. Earleen Newport, DO  [facility]  [facility]   Electronically Signed   By: Corrie Mckusick D.O.   On: 02/08/2015 11:11      Indwelling Urinary Catheter continued, requirement due to   Reason to continue Indwelling Urinary Catheter for strict Intake/Output monitoring for hemodynamic instability   Central Line continued, requirement due to   Reason to continue [employer] Monitoring of central venous pressure or other hemodynamic parameters   Ventilator continued, requirement due to, resp failure    Ventilator Sedation RASS 0 to -2   LINES: RIJ  Cultures: Ur Cx 9/10>> Bld Cx x 2 9/1>>no growth C Diff antigen 9/4> positive C Diff toxin 9/4>positive MRSA by PCR 9/1>>negative  Antibiotics  Flagyl 9/3>> Zosyn 9/1>>9/7 Vanc 9/1>>9/7  ASSESSMENT/PLAN  68 year old female admitted for hypotension, dehydration, C. difficile colitis, hypothermia now with spontaneous left-sided pneumothorax (small)  PULMONARY A: Left-sided pneumothorax Recent pneumonia Septic shock-resolved  P: - Spontaneous,  no apparatus noted on the left side, she does have a right-sided internal jugular catheter, review of x-ray shows that she did not have a pneumothorax on September 3 -Possibly due to coughing spells - Recommend serial chest x-rays, supplemental O2 to maintain saturations close to 200% for the next 24 hours, may need a CT scan of chest if pneumothorax is getting worse.  May require Chest tube if clinical status declines or expansion of pneumothorax on imaging, recommend surgical consult early for chest tube evaluation. -Pneumonia has been treated with 7 days of antibiotics -Currently being treated for C. difficile colitis  CARDIOVASCULAR CVL - RIJ A: Hypotension -stable P:  -Hold any pressure medications, currently not requiring any pressors.  Off pressors since 9/8  RENAL A:  CKD III Acute renal failure DKA - resolving GAP met acidosis - resolved P:   - cont with gentle IV fluids - renal following  GASTROINTESTINAL A:  C. Diff colitis P:   - cont with flagyl   INFECTIOUS A:  C. Diff colitis P:   - improving, currently on flagyl, completed vanc\zosyn  ENDOCRINE A:  DKA P:   - transitioned from insulin gtt to lantus and insulin  NEUROLOGIC A:  Mild lethargy P:   -possibly due to currently illness and metabolic derrangements.  RASS goal: 0   I have personally obtained a history, examined the patient, evaluated laboratory and imaging results, formulated the assessment and plan and placed orders.  The Patient requires high complexity decision making for assessment and support, frequent evaluation and titration of therapies, application of advanced monitoring technologies and extensive interpretation of multiple databases. Critical Care Time devoted to patient care services described in this note is 45 minutes.   Overall, patient is critically ill, prognosis is guarded. Patient at high risk for cardiac arrest and death.   Vilinda Boehringer, MD [facility] Pager [phone] (please enter  7-digits) On Call Pager [phone] (please enter 7-digits)     02/21/2015, 11:26 AM    ,

## 2015-02-10 ENCOUNTER — Inpatient Hospital Stay: Payer: Medicare Other

## 2015-02-10 DIAGNOSIS — R29898 Other symptoms and signs involving the musculoskeletal system: Secondary | ICD-10-CM

## 2015-02-10 DIAGNOSIS — F329 Major depressive disorder, single episode, unspecified: Secondary | ICD-10-CM

## 2015-02-10 LAB — GLUCOSE, CAPILLARY
GLUCOSE-CAPILLARY: 117 mg/dL — AB (ref 65–99)
GLUCOSE-CAPILLARY: 112 mg/dL — AB (ref 65–99)
GLUCOSE-CAPILLARY: 130 mg/dL — AB (ref 65–99)
GLUCOSE-CAPILLARY: 145 mg/dL — AB (ref 65–99)
Glucose-Capillary: 247 mg/dL — ABNORMAL HIGH (ref 65–99)
Glucose-Capillary: 296 mg/dL — ABNORMAL HIGH (ref 65–99)
Glucose-Capillary: 259 mg/dL — ABNORMAL HIGH (ref 65–99)

## 2015-02-10 LAB — BASIC METABOLIC PANEL
ANION GAP: 4 — AB (ref 5–15)
BUN: 10 mg/dL (ref 6–20)
CHLORIDE: 108 mmol/L (ref 101–111)
CO2: 25 mmol/L (ref 22–32)
Calcium: 7.5 mg/dL — ABNORMAL LOW (ref 8.9–10.3)
Creatinine, Ser: 0.76 mg/dL (ref 0.44–1.00)
GFR calc Af Amer: >60 mL/min (ref >60)
GFR calc non Af Amer: >60 mL/min (ref >60)
GLUCOSE: 126 mg/dL — AB (ref 65–99)
POTASSIUM: 4.5 mmol/L (ref 3.5–5.1)
Sodium: 137 mmol/L (ref 135–145)

## 2015-02-10 LAB — MAGNESIUM: Magnesium: 1.4 mg/dL — ABNORMAL LOW (ref 1.7–2.4)

## 2015-02-10 LAB — BRAIN NATRIURETIC PEPTIDE: B NATRIURETIC PEPTIDE 5: 129 pg/mL — AB (ref 0.0–100.0)

## 2015-02-10 LAB — PHOSPHORUS: Phosphorus: 3.5 mg/dL (ref 2.5–4.6)

## 2015-02-10 MED ORDER — MORPHINE SULFATE (PF) 2 MG/ML IV SOLN
2.0000 mg | INTRAVENOUS | Status: DC | PRN
  Administered 2015-02-10: 2 mg via INTRAVENOUS
  Filled 2015-02-10: qty 1

## 2015-02-10 MED ORDER — MAGNESIUM SULFATE 2 GM/50ML IV SOLN
2.0000 g | Freq: Once | INTRAVENOUS | Status: AC
  Administered 2015-02-10: 2 g via INTRAVENOUS
  Filled 2015-02-10: qty 50

## 2015-02-10 NOTE — Care Management Note (Signed)
Case Management Note  Patient Details  Name: JYOTI HARJU MRN: [id number] Date of Birth: Aug 07, 1945  Subjective/Objective:   New bed search initiated by CSW for patient that was previously at [facility]. Noted new spontaneous pneumothorax with CT placement on 09/11.  Monitoring progress.             Action/Plan: SNF when medically stable..   Expected Discharge Date:                  Expected Discharge Plan:  Skilled Nursing Facility  In-House Referral:  Clinical Social Work  Discharge planning Services     Post Acute Care Choice:    Choice offered to:     DME Arranged:    DME Agency:     HH Arranged:    HH Agency:     Status of Service:  In process, will continue to follow  Medicare Important Message Given:  Yes-fourth notification given Date Medicare IM Given:    Medicare IM give by:    Date Additional Medicare IM Given:    Additional Medicare Important Message give by:     If discussed at Long Length of Stay Meetings, dates discussed:    Additional Comments:  Marily Memos, RN 02/10/2015, 8:53 AM

## 2015-02-10 NOTE — Progress Notes (Addendum)
No new complaints No distress Flat affect  Filed Vitals:   02/10/15 1015 02/10/15 1100 02/10/15 1200 02/10/15 1300  BP: 103/47  Pulse: 83 84 85 86  Temp: 97.5 F (36.4 C) 97.7 F (36.5 C) 98.2 F (36.8 C) 98.2 F (36.8 C)  TempSrc:   Rectal   Resp: Height:      Weight:      SpO2: 100% 100% 100% 100%   NAD No wheezes Reg, no M NABS Symmetric ankle edema  BMP Latest Ref Rng 02/10/2015 02/01/2015 02/08/2015  Glucose 65 - 99 mg/dL 161(W) 960(A) 540(J)  BUN 6 - 20 mg/dL Creatinine 0.44 - 1.00 mg/dL 8.11 9.14 7.82  Sodium 135 - 145 mmol/L 137 137 136  Potassium 3.5 - 5.1 mmol/L 4.5 4.2 4.0  Chloride 101 - 111 mmol/L 108 112(H) 113(H)  CO2 22 - 32 mmol/L 25 22 20(L)  Calcium 8.9 - 10.3 mg/dL 7.5(L) 7.8(L) 7.7(L)    CBC Latest Ref Rng 02/08/2015 02/05/2015 02/04/2015  WBC 3.6 - 11.0 K/uL 11.4(H) 18.0(H) 16.7(H)  Hemoglobin 12.0 - 16.0 g/dL 9.5(A) 2.1(H) 0.8(M)  Hematocrit 35.0 - 47.0 % 27.1(L) 26.8(L) 26.8(L)  Platelets 150 - 440 K/uL 194 262 259    CXR: minimal apical pneumothorax, B effusions  IMPRESSION: Hypotension, resolved AKI, resolved C diff colitis - on appropriate abx for this DKA, resolved DM - controlled Suspect reactive depression with poor motivation, poor appetite, flat affect  Modafinil initiated 9/11 might help somewhat with this  Severe deconditioning  Spontaneous pneumothorax  No air leak  Pleural effusions - fluid has appearance of a transudate  This and presence of peripheral edema raise concern for CHF  PLAN/REC: Chest tube to water seal CXR in AM 9/13. Might consider removal of chest tube if no recurrence of PTX  Surgery managing chest tube Check BNP - if elevated significantly, consider echocardiogram Will need aggressive PT and rehab after chest tube removal  Billy Fischer, MD PCCM service Mobile [phone] Pager [phone]

## 2015-02-10 NOTE — Progress Notes (Signed)
Nutrition Follow-up  DOCUMENTATION CODES:   Severe malnutrition in context of acute illness/injury  INTERVENTION:   Coordination of Care: calorie count was started on Friday as no documentation of po intake in computer; pt continues with poor intake despite pt reporting that she is eating well; per calorie count and per documentation in I/O pt is not meeting nutritional needs, mostly eating bites and sips at meal times or sometimes not eating anything at all. Pt met clinical characteristics for severe malnutrition on this admission and on recent previous admission. Discussed with MD Manuella Ghazi. If po intake remains inadequate, pt would likely benefit from nutrition support.  Meals and Snacks: Cater to patient preferences, assisted pt with meal order for lunch and dinner today; continue snacks between meals Medical Food Supplement Therapy: continue Sugar Free Carnation Instant Breakfast TID Feeding Assistance: recommend feeding assistance with encouragement at meal times to promote po intake   NUTRITION DIAGNOSIS:   Inadequate oral intake related to acute illness as evidenced by NPO status, other (see comment) (thrush in mouth). Continues  GOAL:   Patient will meet greater than or equal to 90% of their needs  MONITOR:    (Energy intake, Glucose profile, Electrolyte and renal profile)   ASSESSMENT:    Pt with spontaenous pnemothorax requiring chest tube placement with 1800 mL out; pt with very flat affect, appears drowsy  Diet Order:  Diet Carb Modified Fluid consistency:: Thin; Room service appropriate?: Yes with Assist   Energy Intake: pt ate bites of blueberry muffin and drank some Carnation Instant Breakfast this AM, ate 1/4 hamburger and drank some unsweet tea at lunch, did not want carnation instant breakfast. Calorie Count ordered through the weekend, only completed On Friday. For Friday, pt did not eat anything at breakfast or dinner. Ate 1/4 cottage cheese and fruit place and 25% of  Carnation Instant Breakfast. Recorded po intake on Saturday and Sunday was 0-10% of meals. Pt reports she is eating at least half her meal trays but this is  not reflected in documentation and pt definitly did not eat 50% of breakfast or lunch tray today  Electrolyte and Renal Profile:  Recent Labs Lab 02/08/15 0459 02/25/2015 0507 02/10/15 0521  BUN '12 11 10  ' CREATININE 0.69 0.73 0.76  NA 136 137 137  K 4.0 4.2 4.5  MG 1.6* 1.7 1.4*  PHOS 3.3 3.2 3.5   Glucose Profile:   Recent Labs  02/10/15 0601 02/10/15 0722 02/10/15 1125  GLUCAP 112* 130* 145*   Meds: ss novolog, lantus  Last BM:  9/10  Height:   Ht Readings from Last 1 Encounters:  02/19/2015 '5\' 1"'  (1.549 m)    Weight:   Wt Readings from Last 1 Encounters:  02/10/15 140 lb 6.9 oz (63.7 kg)   BMI:  Body mass index is 26.55 kg/(m^2).  Estimated Nutritional Needs:   Kcal:  BEE 1012 kcals (IF 1.0-1.2, AF 1.3) 1447-1710 kcals/d.   Protein:  (1.0-1.2 g/kg) 54-65 g/d  Fluid:  (30-67m/kg) 1620-18938md  EDUCATION NEEDS:   No education needs identified at this time  HIR[place] CityRDLebecLDN ([phone]

## 2015-02-10 NOTE — Progress Notes (Signed)
1 Day Post-Op   Subjective:  She's draining approximately 1800 cc from her left chest in the last 24 hours. The fluid is mostly straw-colored without any evidence of blood. She has a very small air leak but has not been on suction. There is a tiny apical pneumothorax.  Vital signs in last 24 hours: Temp:  [95.2 F (35.1 C)-98.2 F (36.8 C)] 98.2 F (36.8 C) (09/12 1300) Pulse Rate:  [72-86] 86 (09/12 1300) Resp:  [15-38] 25 (09/12 1300) BP: (78-112)/(44-70) 103/47 mmHg (09/12 1300) SpO2:  [100 %] 100 % (09/12 1300) Weight:  [140 lb 6.9 oz (63.7 kg)] 140 lb 6.9 oz (63.7 kg) (09/12 0506) Last BM Date: 02/08/15  Intake/Output from previous day: 09/11 0701 - 09/12 0700 In: 500 [P.O.:450; IV Piggyback:50] Out: 1550 [Chest Tube:1550]  GI: Her abdomen is benign. Her chest is clear bilaterally. She denies any shortness of breath. She is having moderate left chest pain at the site of the chest tube insertion  Lab Results:  CBC  Recent Labs  02/08/15 0459  WBC 11.4*  HGB 8.8*  HCT 27.1*  PLT 194   CMP     Component Value Date/Time   NA 137 02/10/2015 0521   NA 128* 09/27/2014 1813   K 4.5 02/10/2015 0521   K 5.7* 09/27/2014 1813   CL 108 02/10/2015 0521   CL 97* 09/27/2014 1813   CO2 25 02/10/2015 0521   CO2 22 09/27/2014 1813   GLUCOSE 126* 02/10/2015 0521   GLUCOSE 154* 09/27/2014 1813   BUN 10 02/10/2015 0521   BUN 22* 09/27/2014 1813   CREATININE 0.76 02/10/2015 0521   CREATININE 1.41* 09/27/2014 1813   CALCIUM 7.5* 02/10/2015 0521   CALCIUM 9.9 09/27/2014 1813   PROT 6.8 February 20, 2015 0214   PROT 8.2* 09/27/2014 1813   ALBUMIN 1.7* 02/08/2015 0459   ALBUMIN 4.5 09/27/2014 1813   AST 31 02-20-15 0214   AST 63* 09/27/2014 1813   ALT 13* 02-20-15 0214   ALT 28 09/27/2014 1813   ALKPHOS 155* February 20, 2015 0214   ALKPHOS 109 09/27/2014 1813   BILITOT 1.2 02-20-15 0214   BILITOT 0.7 09/27/2014 1813   GFRNONAA >60 02/10/2015 0521   GFRNONAA 38* 09/27/2014 1813    GFRAA >60 02/10/2015 0521   GFRAA 45* 09/27/2014 1813   PT/INR No results for input(s): LABPROT, INR in the last 72 hours.  Studies/Results: Dg Chest 1 View  02/17/2015   CLINICAL DATA:  69 year old female with a history of shortness of breath.  EXAM: CHEST  1 VIEW  COMPARISON:  02/01/2015  FINDINGS: Cardiomediastinal silhouette likely unchanged. Left heart border obscured by overlying lung/ pleural disease.  Increasing opacity at the left base with obscuration left hemidiaphragm and left heart border. Blunting of the left greater than right cardiophrenic angle. Hazy opacity at the right base.  Interval development of left-sided pneumothorax.  Unchanged position of right IJ central catheter.  IMPRESSION: Interval development of left-sided pneumothorax.  These results were called by telephone at the time of interpretation on 02/08/2015 at 11:11 am to the nurse caring for the patient Ms Bernette Mayers, who verbally acknowledged these results. Ms Bernette Mayers  Enlarging left-sided pleural effusion with likely atelectasis. Infection cannot be excluded.  Small right pleural effusion and atelectasis.  Unchanged right IJ central catheter.  Signed,  Yvone Neu. Loreta Ave, DO  [facility]  [facility]   Electronically Signed   By: Gilmer Mor D.O.   On: 02/21/2015 11:11  Dg Chest Port 1 View  02/10/2015   CLINICAL DATA:  Pneumothorax, follow-up.  Chest tube.  EXAM: PORTABLE CHEST - 1 VIEW  COMPARISON:  02/24/2015  FINDINGS: Left apical pneumothorax again noted, stable. Left chest tube remains in place, unchanged. Right central line is unchanged. Bilateral perihilar and lower lobe airspace opacities are again noted, unchanged. Small bilateral pleural effusions.  IMPRESSION: Stable small left apical pneumothorax and position of left chest tube.  Stable bilateral airspace disease and small effusions.   Electronically Signed   By: Charlett Nose M.D.   On: 02/10/2015 10:09    Dg Chest Port 1 View  02/28/2015   CLINICAL DATA:  Left-sided pneumothorax.  EXAM: PORTABLE CHEST - 1 VIEW  COMPARISON:  Same day.  FINDINGS: Stable cardiomegaly. Minimal left apical pneumothorax is noted, which is significantly improved status post left-sided chest tube placement. Right internal jugular catheter is noted with distal tip in expected position of the SVC. Improved bibasilar opacities are noted concerning for edema or atelectasis. Mild right pleural effusion is noted which is increased compared to prior exam. Minimal left pleural effusion is noted. Multiple acute left rib fractures are noted.  IMPRESSION: Minimal left apical pneumothorax is noted, which is significantly improved status post chest tube placement. Improved bibasilar opacities are noted concerning for improving edema or atelectasis. Mild right pleural effusion is noted which is increased compared to prior exam. Multiple acute left upper rib fractures are noted.   Electronically Signed   By: Lupita Raider, M.D.   On: 02/19/2015 14:20    Assessment/Plan: I have reviewed her x-rays. We will place her chest tube on suction for 12-24 hours to see how she does with her pneumothorax. The etiology of her pneumothorax is still unclear. It is likely her pneumothorax secondary to her rib fractures. She is continuing to have pain in her left chest so we will put her on some low-dose narcotic for a couple doses see how she does. I discussed her blood pressure and hypotension with the nurse caring for her today. She is currently not on any pressor support.

## 2015-02-10 NOTE — Progress Notes (Signed)
Present with Dr. Michela Pitcher as MD changed chest tube drainage system. Informed MD about patient's pain not being relieved by tyelnol. MD to look into pain medication for patient.

## 2015-02-10 NOTE — Progress Notes (Signed)
Sugars today ion the 112 - 145 range on her current regimen of Lantus + NovoLog. She reports good appetite. Eating most of meal trays. No N/V/abd pain. Had left CT placed yesterday for pneumothorax. She reports good pain control.    Medical history Past Medical History  Diagnosis Date  . Diabetes mellitus type 1     (prior saw Dr. Lodema Hong endo, would like to establish locally)  . Generalized headaches     frequent  . Hypothyroidism   . Convulsions/seizures     unknown type, nml EEG  . History of chicken pox   . Anemia   . Vitamin B12 deficiency 03/2012    normal IF     Surgical history Past Surgical History  Procedure Laterality Date  . Cesarean section  1983     Medications . antiseptic oral rinse  7 mL Mouth Rinse BID  . calcium carbonate  200 mg of elemental calcium Oral BID  . clotrimazole  10 mg Oral 5 X Daily  . enoxaparin (LOVENOX) injection  40 mg Subcutaneous Q24H  . insulin aspart  0-10 Units Subcutaneous TID WC  . insulin aspart  0-6 Units Subcutaneous TID WC  . insulin glargine  6 Units Subcutaneous Daily  . levothyroxine  125 mcg Oral QAC breakfast  . metroNIDAZOLE  500 mg Oral 3 times per day  . modafinil  100 mg Oral Daily  . ofloxacin  1 drop Both Eyes QID  . sodium bicarbonate  650 mg Oral TID  . sodium chloride  3 mL Intravenous Q12H    Review of systems CV: no chest pain or palpitations ABD: no abdominal pain or nausea or vomiting   Physical Exam BP 98/64 mmHg  Pulse 85  Temp(Src) 98.2 F (36.8 C) (Rectal)  Resp 24  Ht  (1.549 m)  Wt 63.7 kg (140 lb 6.9 oz)  BMI 26.55 kg/m2  SpO2 100%  GEN: well developed, well nourished female, in NAD. HEENT: No proptosis, EOMI, lid lag or stare. Oropharynx is clear.  NECK: supple, trachea midline.   RESPIRATORY: clear bilaterally, no wheeze, good inspiratory effort. CV: No carotid bruits, RRR. MUSCULOSKELETAL: no clubbing, no tremor ABD: soft, NT/ND, no organomegaly EXT: no peripheral  edema SKIN: no dermatopathy or rash or acanthosis nigricans.  LYMPH: no submandibular or supraclavicular LAD PSYC: alert and oriented, fair insight  Labs Results for orders placed or performed during the hospital encounter of 01/31/2015 (from the past 24 hour(s))  Glucose, capillary     Status: Abnormal   Collection Time: 03-11-15  3:03 PM  Result Value Ref Range   Glucose-Capillary 128 (H) 65 - 99 mg/dL  Glucose, capillary     Status: Abnormal   Collection Time: 2015-03-11  4:00 PM  Result Value Ref Range   Glucose-Capillary 121 (H) 65 - 99 mg/dL  Glucose, capillary     Status: Abnormal   Collection Time: Mar 11, 2015  5:50 PM  Result Value Ref Range   Glucose-Capillary 140 (H) 65 - 99 mg/dL  Glucose, capillary     Status: Abnormal   Collection Time: 2015/03/11  8:29 PM  Result Value Ref Range   Glucose-Capillary 177 (H) 65 - 99 mg/dL  Glucose, capillary     Status: Abnormal   Collection Time: 03/11/2015 11:39 PM  Result Value Ref Range   Glucose-Capillary 156 (H) 65 - 99 mg/dL  Glucose, capillary     Status: Abnormal   Collection Time: 02/10/15  3:07 AM  Result Value Ref Range  Glucose-Capillary 117 (H) 65 - 99 mg/dL  Basic metabolic panel     Status: Abnormal   Collection Time: 02/10/15  5:21 AM  Result Value Ref Range   Sodium 137 135 - 145 mmol/L   Potassium 4.5 3.5 - 5.1 mmol/L   Chloride 108 101 - 111 mmol/L   CO2 25 22 - 32 mmol/L   Glucose, Bld 126 (H) 65 - 99 mg/dL   BUN 10 6 - 20 mg/dL   Creatinine, Ser 1.61 0.44 - 1.00 mg/dL   Calcium 7.5 (L) 8.9 - 10.3 mg/dL   GFR calc non Af Amer >60 >60 mL/min   GFR calc Af Amer >60 >60 mL/min   Anion gap 4 (L) 5 - 15  Magnesium     Status: Abnormal   Collection Time: 02/10/15  5:21 AM  Result Value Ref Range   Magnesium 1.4 (L) 1.7 - 2.4 mg/dL  Phosphorus     Status: None   Collection Time: 02/10/15  5:21 AM  Result Value Ref Range   Phosphorus 3.5 2.5 - 4.6 mg/dL  Glucose, capillary     Status: Abnormal   Collection Time:  02/10/15  6:01 AM  Result Value Ref Range   Glucose-Capillary 112 (H) 65 - 99 mg/dL  Glucose, capillary     Status: Abnormal   Collection Time: 02/10/15  7:22 AM  Result Value Ref Range   Glucose-Capillary 130 (H) 65 - 99 mg/dL  Glucose, capillary     Status: Abnormal   Collection Time: 02/10/15 11:25 AM  Result Value Ref Range   Glucose-Capillary 145 (H) 65 - 99 mg/dL     Assessment Type 1 diabetes with hyperglycemia C.diff colitis Left spontaneous pneumothorax  Plan Continue current regimen of Lantus + NovoLog Continue current diet orders Anticipate restarting insulin pump once clinically improved.  She remains critically ill, although improved and no longer on pressors.  Time spent was 35 min.

## 2015-02-10 NOTE — Progress Notes (Signed)
[facility] - [place] at [facility]   PATIENT NAME: [name]    MR#:  [id number]  DATE OF BIRTH:  [birth year]  SUBJECTIVE:  CHIEF COMPLAINT:   Chief Complaint  Patient presents with  . Hypotension   no new complaints, has a flat affect REVIEW OF SYSTEMS:  Review of Systems  Constitutional: Negative for fever, weight loss, malaise/fatigue and diaphoresis.  HENT: Negative for ear discharge, ear pain, hearing loss, nosebleeds, sore throat and tinnitus.   Eyes: Negative for blurred vision and pain.  Respiratory: Negative for cough, hemoptysis, shortness of breath and wheezing.   Cardiovascular: Positive for chest pain. Negative for palpitations, orthopnea and leg swelling.  Gastrointestinal: Negative for heartburn, nausea, vomiting, abdominal pain, diarrhea, constipation and blood in stool.  Genitourinary: Negative for dysuria, urgency and frequency.  Musculoskeletal: Negative for myalgias and back pain.  Skin: Negative for itching and rash.  Neurological: Negative for dizziness, tingling, tremors, focal weakness, seizures, weakness and headaches.  Psychiatric/Behavioral: Negative for depression. The patient is not nervous/anxious.    DRUG ALLERGIES:  No Known Allergies VITALS:  Blood pressure 90/45, pulse 88, temperature 98.2 F (36.8 C), temperature source Rectal, resp. rate 26, height  (1.549 m), weight 140 lb 6.9 oz (63.7 kg), SpO2 100 %. PHYSICAL EXAMINATION:  Physical Exam  Constitutional: She is oriented to person, place, and time and well-developed, well-nourished, and in no distress.  HENT:  Head: Normocephalic and atraumatic.  Eyes: Conjunctivae and EOM are normal. Pupils are equal, round, and reactive to light.  Neck: Normal range of motion. Neck supple. No tracheal deviation present. No thyromegaly present.  Cardiovascular: Normal rate, regular rhythm and normal heart sounds.   Pulmonary/Chest: Effort normal and breath sounds normal.  No respiratory distress. She has no wheezes. She exhibits no tenderness.  Left-sided chest tube in place under waterseal  Abdominal: Soft. Bowel sounds are normal. She exhibits no distension. There is no tenderness.  Musculoskeletal: Normal range of motion.  Neurological: She is alert and oriented to person, place, and time. No cranial nerve deficit.  Skin: Skin is warm and dry. No rash noted.  Psychiatric: Mood and affect normal.   LABORATORY PANEL:   CBC  Recent Labs Lab 02/08/15 0459  WBC 11.4*  HGB 8.8*  HCT 27.1*  PLT 194   ------------------------------------------------------------------------------------------------------------------ Chemistries   Recent Labs Lab 02/10/15 0521  NA 137  K 4.5  CL 108  CO2 25  GLUCOSE 126*  BUN 10  CREATININE 0.76  CALCIUM 7.5*  MG 1.4*   RADIOLOGY:  Dg Chest Port 1 View  02/10/2015   CLINICAL DATA:  Pneumothorax, follow-up.  Chest tube.  EXAM: PORTABLE CHEST - 1 VIEW  COMPARISON:  02/16/2015  FINDINGS: Left apical pneumothorax again noted, stable. Left chest tube remains in place, unchanged. Right central line is unchanged. Bilateral perihilar and lower lobe airspace opacities are again noted, unchanged. Small bilateral pleural effusions.  IMPRESSION: Stable small left apical pneumothorax and position of left chest tube.  Stable bilateral airspace disease and small effusions.   Electronically Signed   By: Charlett Nose M.D.   On: 02/10/2015 10:09   ASSESSMENT AND PLAN:  * DKA (diabetic ketoacidoses) -resolved. Sugars today ion the 112 - 145 range on her current regimen of Lantus + NovoLog.  Endocrinology following.  Can likely restart insulin pump.  Once clinically better  * septic shock: due to cdiff colitis.  Improving  * C. difficile colitis: continue Flagyl  * hypothermia ;  improving  * pneumonia: Treated  * Depression: On modafanil (started 9/11). Has flat affect  * Acute renal failure with hyponatremia: Resolved  *  Spontaneous pneumothorax: Could be from recent rib injury, continue chest tube to water seal, surgery is managing it, may come out soon  * Hypothyroidism - continue thyroid replacement.   * Severe routine calorie Malnutrition: Likely due to poor by mouth intake, dietary following. continue Carnation instant breakfast    All the records are reviewed and case discussed with Care Management/Social Worker. Management plans discussed with the patient, nursing and to [facility] - they are in agreement.  CODE STATUS: DO NOT RESUSCITATE  TOTAL TIME CRITICAL CARE TAKING CARE OF THIS PATIENT: 35 minutes.   More than 50% of the time was spent in counseling/coordination of care: YES  POSSIBLE D/C IN 2-3 DAYS, DEPENDING ON CLINICAL CONDITION.   [facility], Mannat Benedetti M.D on 02/10/2015 at 3:42 PM  Between 7am to 6pm - Pager - [phone]  After 6pm go to [email or web address] - password [email or web address] [facility]  [place] [facility]  Office  [phone]  CC:  Primary care physician; Lauro Regulus., MD

## 2015-02-10 NOTE — Progress Notes (Signed)
Pharmacy Consult for Electrolyte Management   No Known Allergies  Patient Measurements: Height: 5\' 1"  (154.9 cm) Weight: 140 lb 6.9 oz (63.7 kg) IBW/kg (Calculated) : 47.8  Vital Signs: Temp: 98.1 F (36.7 C) (09/12 0600) Temp Source: Rectal (09/12 0500) BP: 112/49 mmHg (09/12 0600) Pulse Rate: 83 (09/12 0600) Intake/Output from previous day: 09/11 0701 - 09/12 0700 In: 500 [P.O.:450; IV Piggyback:50] Out: 1550 [Chest Tube:1550] Intake/Output from this shift:    Labs:  Recent Labs  02/08/15 0459  WBC 11.4*  HGB 8.8*  HCT 27.1*  PLT 194    Recent Labs  02/08/15 0459 01/30/2015 0507 02/10/15 0521  NA 136 137 137  K 4.0 4.2 4.5  CL 113* 112* 108  CO2 20* 22 25  GLUCOSE 253* 121* 126*  BUN 12 11 10   CREATININE 0.69 0.73 0.76  CALCIUM 7.7* 7.8* 7.5*  MG 1.6* 1.7 1.4*  PHOS 3.3 3.2 3.5  ALBUMIN 1.7*  --   --    Estimated Creatinine Clearance: 58.4 mL/min (by C-G formula based on Cr of 0.76).    Recent Labs  02/21/2015 2339 02/10/15 0307 02/10/15 0601  GLUCAP 156* 117* 112*    Medical History: Past Medical History  Diagnosis Date  . Diabetes mellitus type 1     (prior saw Dr. Lodema Hong endo, would like to establish locally)  . Generalized headaches     frequent  . Hypothyroidism   . Convulsions/seizures     unknown type, nml EEG  . History of chicken pox   . Anemia   . Vitamin B12 deficiency 03/2012    normal IF    Medications:  Scheduled:  . antiseptic oral rinse  7 mL Mouth Rinse BID  . calcium carbonate  200 mg of elemental calcium Oral BID  . clotrimazole  10 mg Oral 5 X Daily  . enoxaparin (LOVENOX) injection  40 mg Subcutaneous Q24H  . insulin aspart  0-10 Units Subcutaneous TID WC  . insulin aspart  0-6 Units Subcutaneous TID WC  . insulin glargine  6 Units Subcutaneous Daily  . levothyroxine  125 mcg Oral QAC breakfast  . metroNIDAZOLE  500 mg Oral 3 times per day  . modafinil  100 mg Oral Daily  . ofloxacin  1 drop Both Eyes QID   . sodium bicarbonate  650 mg Oral TID  . sodium chloride  3 mL Intravenous Q12H   Infusions:    PRN: acetaminophen **OR** acetaminophen, ALPRAZolam, calcium carbonate, ipratropium-albuterol, ondansetron **OR** ondansetron (ZOFRAN) IV, sodium chloride, sodium chloride  Assessment: Phamracy consulted to assist in managing electrolytes in this 69 y/o F with DKA.   Plan:  Phosphorus and magnesium are low so will replace with magnesium sulfate 2 g iv once and sodium phosphate 10 mmol iv once. Will recheck electrolytes with AM labs.  Pharmacy to follow per consult.   9/7 Potassium 3.4 and Calcium 8.2 corrected. Will order KCl 20 mEq IV and 1 gram calcium gluconate IV. F/u labs in AM.  9/8 Potassium 3.4, Mg 1.6, and Ca 7.7. KCl 20 mEq, calcium gluconate 1 gram and magnesium sulfate 2 grams ordered. F/u labs in AM.  9/9 Potassium 3.3 and calcium 7.4. KCl 40 mEq and calcium gluconate 2 grams x1 ordered. F/u labs in AM.  9/10 Electrolytes WNL except Mg 1.6. Corrected calcium 9.5 for albumin of 1.7. Magnesium 2 grams ordered. Recheck in AM.  9/11 Electrolytes WNL, including corrected calcium.. Recheck labs in AM.  9/12 Electrolytes WNL except Mg 1.4.  2 grams magnesium sulfate x1 ordered. Recheck in AM.  Erich Montane, [facility] Clinical Pharmacist   02/10/2015,7:05 AM

## 2015-02-10 NOTE — Progress Notes (Signed)
Notified Dr. Sung Amabile about patient's low blood pressure throughout night. Patient currently alert and oriented and has been making urine (voided just prior to shift change). MD gave no new orders at this time, will assess when arrives on unit.

## 2015-02-11 ENCOUNTER — Inpatient Hospital Stay: Payer: Medicare Other

## 2015-02-11 DIAGNOSIS — E43 Unspecified severe protein-calorie malnutrition: Secondary | ICD-10-CM

## 2015-02-11 DIAGNOSIS — R5381 Other malaise: Secondary | ICD-10-CM

## 2015-02-11 DIAGNOSIS — L8990 Pressure ulcer of unspecified site, unspecified stage: Secondary | ICD-10-CM | POA: Diagnosis present

## 2015-02-11 LAB — MAGNESIUM: MAGNESIUM: 1.5 mg/dL — AB (ref 1.7–2.4)

## 2015-02-11 LAB — GLUCOSE, CAPILLARY
GLUCOSE-CAPILLARY: 176 mg/dL — AB (ref 65–99)
GLUCOSE-CAPILLARY: 223 mg/dL — AB (ref 65–99)
Glucose-Capillary: 222 mg/dL — ABNORMAL HIGH (ref 65–99)
Glucose-Capillary: 254 mg/dL — ABNORMAL HIGH (ref 65–99)

## 2015-02-11 LAB — URINE CULTURE
Culture: >=100000
Special Requests: NORMAL

## 2015-02-11 LAB — BASIC METABOLIC PANEL
Anion gap: 4 — ABNORMAL LOW (ref 5–15)
BUN: 9 mg/dL (ref 6–20)
CHLORIDE: 104 mmol/L (ref 101–111)
CO2: 26 mmol/L (ref 22–32)
CREATININE: 0.77 mg/dL (ref 0.44–1.00)
Calcium: 7.4 mg/dL — ABNORMAL LOW (ref 8.9–10.3)
Glucose, Bld: 244 mg/dL — ABNORMAL HIGH (ref 65–99)
Potassium: 4.9 mmol/L (ref 3.5–5.1)
SODIUM: 134 mmol/L — AB (ref 135–145)

## 2015-02-11 LAB — PHOSPHORUS: PHOSPHORUS: 2.6 mg/dL (ref 2.5–4.6)

## 2015-02-11 MED ORDER — MAGNESIUM SULFATE 4 GM/100ML IV SOLN
4.0000 g | Freq: Once | INTRAVENOUS | Status: AC
  Administered 2015-02-11: 4 g via INTRAVENOUS
  Filled 2015-02-11: qty 100

## 2015-02-11 MED ORDER — MORPHINE SULFATE (PF) 2 MG/ML IV SOLN
2.0000 mg | INTRAVENOUS | Status: DC | PRN
  Administered 2015-02-11: 2 mg via INTRAVENOUS
  Filled 2015-02-11: qty 1

## 2015-02-11 MED ORDER — POLYVINYL ALCOHOL 1.4 % OP SOLN
1.0000 [drp] | OPHTHALMIC | Status: DC | PRN
  Filled 2015-02-11: qty 15

## 2015-02-11 NOTE — Progress Notes (Signed)
Pharmacy Consult for Electrolyte Management   No Known Allergies  Patient Measurements: Height:  (154.9 cm) Weight: 145 lb 15.1 oz (66.2 kg) IBW/kg (Calculated) : 47.8  Vital Signs: Temp: 97.7 F (36.5 C) (09/13 0500) Temp Source: Rectal (09/13 0500) BP: 95/55 mmHg (09/13 0500) Pulse Rate: 83 (09/13 0500) Intake/Output from previous day: 09/12 0701 - 09/13 0700 In: 1303 [P.O.:1230; I.V.:3; IV Piggyback:50] Out: 910 [Chest Tube:910] Intake/Output from this shift: Total I/O In: 120 [P.O.:120] Out: 430 [Chest Tube:430]  Labs: No results for input(s): WBC, HGB, HCT, PLT, APTT, INR in the last 72 hours.  Recent Labs  02/28/2015 0507 02/10/15 0521 02/11/15 0429  NA 137 137 134*  K 4.2 4.5 4.9  CL 112* 108 104  CO2 GLUCOSE 121* 126* 244*  BUN CREATININE 0.73 0.76 0.77  CALCIUM 7.8* 7.5* 7.4*  MG 1.7 1.4* 1.5*  PHOS 3.2 3.5 2.6   Estimated Creatinine Clearance: 58.7 mL/min (by C-G formula based on Cr of 0.77).    Recent Labs  02/10/15 1611 02/10/15 2102 02/10/15 2306  GLUCAP 247* 296* 259*    Medical History: Past Medical History  Diagnosis Date  . Diabetes mellitus type 1     (prior saw Dr. Lodema Hong endo, would like to establish locally)  . Generalized headaches     frequent  . Hypothyroidism   . Convulsions/seizures     unknown type, nml EEG  . History of chicken pox   . Anemia   . Vitamin B12 deficiency 03/2012    normal IF    Medications:  Scheduled:  . antiseptic oral rinse  7 mL Mouth Rinse BID  . calcium carbonate  200 mg of elemental calcium Oral BID  . clotrimazole  10 mg Oral 5 X Daily  . enoxaparin (LOVENOX) injection  40 mg Subcutaneous Q24H  . insulin aspart  0-10 Units Subcutaneous TID WC  . insulin aspart  0-6 Units Subcutaneous TID WC  . insulin glargine  6 Units Subcutaneous Daily  . levothyroxine  125 mcg Oral QAC breakfast  . magnesium sulfate 1 - 4 g bolus IVPB  4 g Intravenous Once  . metroNIDAZOLE   500 mg Oral 3 times per day  . modafinil  100 mg Oral Daily  . ofloxacin  1 drop Both Eyes QID  . sodium bicarbonate  650 mg Oral TID  . sodium chloride  3 mL Intravenous Q12H   Infusions:    PRN: acetaminophen **OR** acetaminophen, ALPRAZolam, calcium carbonate, ipratropium-albuterol, morphine injection, ondansetron **OR** ondansetron (ZOFRAN) IV, sodium chloride, sodium chloride  Assessment: Phamracy consulted to assist in managing electrolytes in this 69 y/o F with DKA.   Plan:  Phosphorus and magnesium are low so will replace with magnesium sulfate 2 g iv once and sodium phosphate 10 mmol iv once. Will recheck electrolytes with AM labs.  Pharmacy to follow per consult.   9/7 Potassium 3.4 and Calcium 8.2 corrected. Will order KCl 20 mEq IV and 1 gram calcium gluconate IV. F/u labs in AM.  9/8 Potassium 3.4, Mg 1.6, and Ca 7.7. KCl 20 mEq, calcium gluconate 1 gram and magnesium sulfate 2 grams ordered. F/u labs in AM.  9/9 Potassium 3.3 and calcium 7.4. KCl 40 mEq and calcium gluconate 2 grams x1 ordered. F/u labs in AM.  9/10 Electrolytes WNL except Mg 1.6. Corrected calcium 9.5 for albumin of 1.7. Magnesium 2 grams ordered. Recheck in AM.  9/11 Electrolytes WNL, including corrected calcium.Marland Kitchen  Recheck labs in AM.  9/12 Electrolytes WNL except Mg 1.4. 2 grams magnesium sulfate x1 ordered. Recheck in AM.  0913 AM Na 134, K 4.9, Ca 7.4 but adjusted 9.2, phos 2.6, Mg 1.5. Ordered magnesium 4 gm IV x 1, will recheck in AM.  Carola Frost, [facility] Clinical Pharmacist   02/11/2015,5:23 AM

## 2015-02-11 NOTE — Progress Notes (Signed)
Clinical Social Worker (CSW) met with patient and made her aware that [facility] can extend a bed offer. Patient accepted offer and reported that [facility] [place] home. Patient reported that MD is going to pull the chest tube and then she will move out of ICU. [facility] admissions coordinator at [facility] was notified of accepted bed offer. Per Florentina Jenny she will send an employee from [facility] tomorrow to complete admissions paper work with patient. CSW will continue to follow and assist as needed.   Blima Rich, [place] [phone]

## 2015-02-11 NOTE — Progress Notes (Signed)
Speech Language Pathology Treatment: Dysphagia  Patient Details Name: QUETZAL MEANY MRN: [id number] DOB: [birth year] Today's Date: 02/11/2015 Time: 0815-0900 SLP Time Calculation (min) (ACUTE ONLY): 45 min  Assessment / Plan / Recommendation Clinical Impression  Pt appears to present w/ adequate toleration of diet w/ no overt s/s of aspiration noted following general aspiration precautions. Pt presents w/ decreased desire for oral intake overall but no coughing or s/s of aspiration when drinking and eating per assessment and per NSG report. Pt does require assistance w/ tray setup. Rec. Continue w/ current diet w/ general aspiration precautions and assistance at meals; meds in Puree for easier swallowing as nec. No further skilled ST services indicated at this time. NSG to reconsult if nec.    HPI Other Pertinent Information: Pt is a 69 y.o. female who presents with hypotension. Patient states that she recently had a fall last week and broke several ribs. She had DKA at that time, and on discharge was sent to [facility] facility. There she was noted to have thrush and has been treated the last several days, though she feels is ineffective. Due to her thrush she had poor by mouth intake during that time. On the day prior to admission she was noted to have a low blood pressure, and looked very lethargic and felt very weak. On evaluation in the ED she was noted to be tachycardic, with a slightly elevated white blood count, low blood pressure. She is felt to be severely dehydrated, her lactic acid was 2.5. She also seemed to be in mild DKA. Pt awakened and was alert to person/place. She followed basic instructions appropriately. She presented w/ slower motor movements overall but was able to feed self w/ setup and assist. Pt is tolerating her po diet but not eating much per NSG report. Pt has been tolerating her regular diet but only eating bites and sips. Dietician is following.    Pertinent  Vitals Pain Assessment: No/denies pain  SLP Plan  All goals met    Recommendations Diet recommendations: Dysphagia 3 (mechanical soft);Regular;Thin liquid Liquids provided via: Cup;Straw Medication Administration: Whole meds with puree (as nec. ) Supervision: Patient able to self feed;Intermittent supervision to cue for compensatory strategies Compensations: Minimize environmental distractions;Slow rate;Small sips/bites Postural Changes and/or Swallow Maneuvers: Seated upright 90 degrees              Oral Care Recommendations: Oral care BID;Staff/trained caregiver to provide oral care Follow up Recommendations: Skilled Nursing facility Plan: All goals met    [place], [place], CCC-SLP  Watson,Katherine 02/11/2015, 3:50 PM

## 2015-02-11 NOTE — Progress Notes (Signed)
She feels well. Sugars today in the 176 - 254 range on her current regimen of Lantus + NovoLog. She reports good appetite. Eating 50% of meals by her recall. No N/V/abd pain. Still with left CT for pneumothorax. She reports good pain control.    Medical history Past Medical History  Diagnosis Date  . Diabetes mellitus type 1     (prior saw Dr. Lodema Hong endo, would like to establish locally)  . Generalized headaches     frequent  . Hypothyroidism   . Convulsions/seizures     unknown type, nml EEG  . History of chicken pox   . Anemia   . Vitamin B12 deficiency 03/2012    normal IF     Surgical history Past Surgical History  Procedure Laterality Date  . Cesarean section  1983     Medications . antiseptic oral rinse  7 mL Mouth Rinse BID  . calcium carbonate  200 mg of elemental calcium Oral BID  . clotrimazole  10 mg Oral 5 X Daily  . enoxaparin (LOVENOX) injection  40 mg Subcutaneous Q24H  . insulin aspart  0-10 Units Subcutaneous TID WC  . insulin aspart  0-6 Units Subcutaneous TID WC  . insulin glargine  6 Units Subcutaneous Daily  . levothyroxine  125 mcg Oral QAC breakfast  . metroNIDAZOLE  500 mg Oral 3 times per day  . modafinil  100 mg Oral Daily  . sodium bicarbonate  650 mg Oral TID  . sodium chloride  3 mL Intravenous Q12H    Review of systems CV: no chest pain or palpitations ABD: no abdominal pain or nausea or vomiting   Physical Exam BP 94/55 mmHg  Pulse 78  Temp(Src) 98.3 F (36.8 C) (Oral)  Resp 17  Ht  (1.549 m)  Wt 66.2 kg (145 lb 15.1 oz)  BMI 27.59 kg/m2  SpO2 100%  GEN: well developed, well nourished female, in NAD. HEENT: No proptosis, EOMI, lid lag or stare. Oropharynx is clear.  NECK: supple, trachea midline.   RESPIRATORY: clear bilaterally, no wheeze, good inspiratory effort. Left sided CT in place. CV: No carotid bruits, RRR. MUSCULOSKELETAL: no clubbing, no tremor ABD: soft, NT/ND, no organomegaly EXT: no peripheral  edema SKIN: no dermatopathy or rash or acanthosis nigricans.  LYMPH: no submandibular or supraclavicular LAD PSYC: alert and oriented, fair insight  Labs Results for orders placed or performed during the hospital encounter of 01/31/2015 (from the past 24 hour(s))  Glucose, capillary     Status: Abnormal   Collection Time: 02/10/15  9:02 PM  Result Value Ref Range   Glucose-Capillary 296 (H) 65 - 99 mg/dL  Glucose, capillary     Status: Abnormal   Collection Time: 02/10/15 11:06 PM  Result Value Ref Range   Glucose-Capillary 259 (H) 65 - 99 mg/dL  Basic metabolic panel     Status: Abnormal   Collection Time: 02/11/15  4:29 AM  Result Value Ref Range   Sodium 134 (L) 135 - 145 mmol/L   Potassium 4.9 3.5 - 5.1 mmol/L   Chloride 104 101 - 111 mmol/L   CO2 26 22 - 32 mmol/L   Glucose, Bld 244 (H) 65 - 99 mg/dL   BUN 9 6 - 20 mg/dL   Creatinine, Ser 9.60 0.44 - 1.00 mg/dL   Calcium 7.4 (L) 8.9 - 10.3 mg/dL   GFR calc non Af Amer >60 >60 mL/min   GFR calc Af Amer >60 >60 mL/min   Anion gap 4 (  L) 5 - 15  Magnesium     Status: Abnormal   Collection Time: 02/11/15  4:29 AM  Result Value Ref Range   Magnesium 1.5 (L) 1.7 - 2.4 mg/dL  Phosphorus     Status: None   Collection Time: 02/11/15  4:29 AM  Result Value Ref Range   Phosphorus 2.6 2.5 - 4.6 mg/dL  Glucose, capillary     Status: Abnormal   Collection Time: 02/11/15  7:43 AM  Result Value Ref Range   Glucose-Capillary 222 (H) 65 - 99 mg/dL  Glucose, capillary     Status: Abnormal   Collection Time: 02/11/15 11:40 AM  Result Value Ref Range   Glucose-Capillary 254 (H) 65 - 99 mg/dL  Glucose, capillary     Status: Abnormal   Collection Time: 02/11/15  4:02 PM  Result Value Ref Range   Glucose-Capillary 176 (H) 65 - 99 mg/dL     Assessment Type 1 diabetes with hyperglycemia C.diff colitis Left spontaneous pneumothorax  Plan Continue current regimen of Lantus + NovoLog Continue current diet orders Anticipate restarting  insulin pump tomorrow if she remains clinically stable.  Will have family called to bring in pump and BG testing supplies.

## 2015-02-11 NOTE — Progress Notes (Signed)
Paged [facility] and spoke with Dr Clint Guy concerning pt's blood sugars remaining in the 200's.  Per MD, no new changes.  Will continue to monitor.

## 2015-02-11 NOTE — Progress Notes (Signed)
She has no air leak noted this afternoon. Chest x-ray reveals minimal apical pneumothorax. She's feeling well except for some pain along her left chest wall. We will discontinue her suction tentatively plan removal of her tube tomorrow. I discussed this plan with her in detail and she is in agreement.

## 2015-02-11 NOTE — Progress Notes (Signed)
No new complaints No distress  Filed Vitals:   02/11/15 0900 02/11/15 1000 02/11/15 1030 02/11/15 1100  BP: 103/77 87/68 94/53  93/78  Pulse: 80 83 86 86  Temp: 98.1 F (36.7 C) 98.4 F (36.9 C) 98.3 F (36.8 C)   TempSrc:   Oral   Resp: Height:      Weight:      SpO2: 100% 100% 100% 100%   NAD No wheezes Reg, no M NABS Symmetric ankle edema  BMP Latest Ref Rng 02/11/2015 02/10/2015 02/28/2015  Glucose 65 - 99 mg/dL 454(U) 981(X) 914(N)  BUN 6 - 20 mg/dL Creatinine 0.44 - 1.00 mg/dL 8.29 5.62 1.30  Sodium 135 - 145 mmol/L 134(L) 137 137  Potassium 3.5 - 5.1 mmol/L 4.9 4.5 4.2  Chloride 101 - 111 mmol/L 104 108 112(H)  CO2 22 - 32 mmol/L Calcium 8.9 - 10.3 mg/dL 7.4(L) 7.5(L) 7.8(L)    CBC Latest Ref Rng 02/08/2015 02/05/2015 02/04/2015  WBC 3.6 - 11.0 K/uL 11.4(H) 18.0(H) 16.7(H)  Hemoglobin 12.0 - 16.0 g/dL 8.6(V) 7.8(I) 6.9(G)  Hematocrit 35.0 - 47.0 % 27.1(L) 26.8(L) 26.8(L)  Platelets 150 - 440 K/uL 194 262 259    CXR: minimal apical pneumothorax, small B effusions  IMPRESSION: Hypotension, resolved AKI, resolved C diff colitis - on abx for this DKA, resolved DM - marginally controlled Anorexia, flat affect - concern for reactive depression  Modafinil initiated 9/11 might help somewhat with this  Severe deconditioning  R pneumothorax Pleural effusions - fluid has appearance of a transudate  Minimally elevated BNP  Likely due to low oncotic pressure (albumin < 2.0)  PLAN/REC: Chest tube mgmt per surgery Daily CXR while chest tube in place Push nutrition and PT Will ultimately need rehab I have changed to SDU status this AM When her SBP is improved, she can move to surgical floor PCCM will sign off. Please call if we can be of further assistance   Billy Fischer, MD PCCM service Mobile [phone] Pager [phone]

## 2015-02-11 NOTE — Care Management Important Message (Signed)
Important Message  Patient Details  Name: BIRD TAILOR MRN: [id number] Date of Birth: Jul 04, 1945   Medicare Important Message Given:  Yes-second notification given    Marily Memos, RN 02/11/2015, 10:44 AM

## 2015-02-11 NOTE — Progress Notes (Addendum)
[facility] [place] at [facility]   PATIENT NAME: [name]    MR#:  [id number]  DATE OF BIRTH:  April 01, 1946  SUBJECTIVE:  CHIEF COMPLAINT:   Chief Complaint  Patient presents with  . Hypotension  some chest discomfort at the site of chest tube. Husband at bedside.  REVIEW OF SYSTEMS:  Review of Systems  Constitutional: Negative for fever, weight loss, malaise/fatigue and diaphoresis.  HENT: Negative for ear discharge, ear pain, hearing loss, nosebleeds, sore throat and tinnitus.   Eyes: Negative for blurred vision and pain.  Respiratory: Negative for cough, hemoptysis, shortness of breath and wheezing.   Cardiovascular: Positive for chest pain. Negative for palpitations, orthopnea and leg swelling.  Gastrointestinal: Negative for heartburn, nausea, vomiting, abdominal pain, diarrhea, constipation and blood in stool.  Genitourinary: Negative for dysuria, urgency and frequency.  Musculoskeletal: Negative for myalgias and back pain.  Skin: Negative for itching and rash.  Neurological: Negative for dizziness, tingling, tremors, focal weakness, seizures, weakness and headaches.  Psychiatric/Behavioral: Negative for depression. The patient is not nervous/anxious.    DRUG ALLERGIES:  No Known Allergies VITALS:  Blood pressure 101/55, pulse 86, temperature 98.3 F (36.8 C), temperature source Oral, resp. rate 23, height 5\' 1"  (1.549 m), weight 145 lb 15.1 oz (66.2 kg), SpO2 100 %. PHYSICAL EXAMINATION:  Physical Exam  Constitutional: She is oriented to person, place, and time and well-developed, well-nourished, and in no distress.  HENT:  Head: Normocephalic and atraumatic.  Eyes: Conjunctivae and EOM are normal. Pupils are equal, round, and reactive to light.  Neck: Normal range of motion. Neck supple. No tracheal deviation present. No thyromegaly present.  Cardiovascular: Normal rate, regular rhythm and normal heart sounds.   Pulmonary/Chest: Effort  normal and breath sounds normal. No respiratory distress. She has no wheezes. She exhibits no tenderness.  Left-sided chest tube in place under waterseal  Abdominal: Soft. Bowel sounds are normal. She exhibits no distension. There is no tenderness.  Musculoskeletal: Normal range of motion.  Neurological: She is alert and oriented to person, place, and time. No cranial nerve deficit.  Skin: Skin is warm and dry. No rash noted.  Psychiatric: Mood and affect normal.   LABORATORY PANEL:   CBC  Recent Labs Lab 02/08/15 0459  WBC 11.4*  HGB 8.8*  HCT 27.1*  PLT 194   ------------------------------------------------------------------------------------------------------------------ Chemistries   Recent Labs Lab 02/11/15 0429  NA 134*  K 4.9  CL 104  CO2 26  GLUCOSE 244*  BUN 9  CREATININE 0.77  CALCIUM 7.4*  MG 1.5*   RADIOLOGY:  Dg Chest 1 View  02/11/2015   CLINICAL DATA:  Pneumothorax.  EXAM: CHEST  1 VIEW  COMPARISON:  February 10, 2015.  FINDINGS: Stable cardiomediastinal silhouette. Stable bilateral pleural effusions. Stable minimal left apical pneumothorax is noted compared to prior exam. Stable left-sided chest tube. Right internal jugular catheter is unchanged in position.  IMPRESSION: Stable mild bilateral pleural effusions. Stable minimal left apical pneumothorax. No changes noted in position of left-sided chest tube.   Electronically Signed   By: Lupita Raider, M.D.   On: 02/11/2015 08:05   ASSESSMENT AND PLAN:  * DKA (diabetic ketoacidoses) -resolved. Sugars today on the 200-240 range on her current regimen of Lantus + NovoLog.  Endocrinology following.  Can likely restart insulin pump.  Once clinically better  * hypotension: likely at her baseline, she and he husband states she normally runs her BP in 90-100s  * septic shock: due  to cdiff colitis.  Improving  * C. difficile colitis: continue Flagyl  * hypothermia ; improving  * pneumonia: Treated  *  Depression: On modafanil (started 9/11). Has flat affect  * Acute renal failure with hyponatremia: Resolved  * Spontaneous pneumothorax: Could be from recent rib injury, continue chest tube to water seal, surgery is managing it, may come out soon  * Hypothyroidism - continue thyroid replacement.   * Severe protein calorie Malnutrition: Likely due to poor by mouth intake, dietary following. continue Carnation instant breakfast  * Chest pain: will add morphine prn for pain control.   Transfer to medical floor with off unit telemetry.  All the records are reviewed and case discussed with Care Management/Social Worker. Management plans discussed with the patient, nursing and to [facility] - they are in agreement.  CODE STATUS: DO NOT RESUSCITATE  TOTAL TIME CRITICAL CARE TAKING CARE OF THIS PATIENT: 35 minutes.   More than 50% of the time was spent in counseling/coordination of care: YES  POSSIBLE D/C IN 1-2 DAYS, DEPENDING ON CLINICAL CONDITION.   [facility], Aahana Elza M.D on 02/11/2015 at 1:53 PM  Between 7am to 6pm - Pager - [phone]  After 6pm go to [email or web address] - password [email or web address] [facility]  [facility]  Office  [phone]  CC:  Primary care physician; Lauro Regulus., MD

## 2015-02-11 NOTE — Clinical Social Work Note (Signed)
CSW extended bed offers to pt.  Pt requested [facility] for [facility]  CSW initiated a search to [facility].  CSW also called the facility and informed them of pt's preference.  CSW will continue to follow and assist with d/c planning needs.  [place], [place] [phone]

## 2015-02-11 NOTE — Evaluation (Signed)
Physical Therapy RE - Evaluation Patient Details Name: [name] MRN: [id number] DOB: 1945/06/19 Today's Date: 02/11/2015   History of Present Illness  patient presented to ER from STR secondary to hypotension; admitted with DKA, dehydration.  Hospital course significant for placement of L chest tube due to spontaneous pneumothorax (9/11); orders to resume PT received this date (9/13).  Of note, patient with recent hospitalization (12/2014) status post fall with L rib fractures; discharged to STR.  Clinical Impression  Upon evaluation, patient alert and oriented, pleasant and cooperative.  Patient globally weak and deconditioned, but demonstrates functional UE/LE strength at least 3+/5 throughout all extremities.  Reports generalized pain/soreness a at L chest tube insertion site.  Able to complete bed mobility, sit/stand and basic transfers (SPT with RW), min assist; increased time required to complete.  Additional activity limited by pain/fatigue. Vitals stable and WFL, maintaining sats >98% on 3L supplemental O2 throughout session. Would benefit from skilled PT to address above deficits and promote optimal return to PLOF; recommend transition to STR upon discharge from acute hospitalization.     Follow Up Recommendations SNF    Equipment Recommendations       Recommendations for Other Services       Precautions / Restrictions Precautions Precautions: Fall Precaution Comments: L chest tube; enteric isolation; R IJ central line; previous L rib fractures Restrictions Weight Bearing Restrictions: No      Mobility  Bed Mobility Overal bed mobility: Needs Assistance Bed Mobility: Supine to Sit     Supine to sit: Min assist     General bed mobility comments: assist for truncal elevation and chest tube management  Transfers Overall transfer level: Needs assistance Equipment used: Rolling walker (2 wheeled) Transfers: Sit to/from Stand Sit to Stand: Min assist          General transfer comment: cuing for hand placement; assist for lift off and dynamic balance. Mild reports of dizziness with initial transition; improved with accommodation to position  Ambulation/Gait Ambulation/Gait assistance: Min assist Ambulation Distance (Feet): 5 Feet Assistive device: Rolling walker (2 wheeled)       General Gait Details: fair step height/length; slow and effortful; guarded due to pain.  Additional distance deferred secondary to pain/fatigue.  Stairs            Wheelchair Mobility    Modified Rankin (Stroke Patients Only)       Balance Overall balance assessment: Needs assistance Sitting-balance support: No upper extremity supported;Feet supported Sitting balance-Leahy Scale: Fair     Standing balance support: Bilateral upper extremity supported Standing balance-Leahy Scale: Fair                               Pertinent Vitals/Pain Pain Assessment: No/denies pain    Home Living Family/patient expects to be discharged to:: Private residence Living Arrangements: Spouse/significant other Available Help at Discharge: Family   Home Access: Stairs to enter   Entrance Stairs-Number of Steps: 4 Home Layout: Two level;Able to live on main level with bedroom/bathroom        Prior Function                 Hand Dominance        Extremity/Trunk Assessment   Upper Extremity Assessment: Generalized weakness           Lower Extremity Assessment: Generalized weakness (grossly at least 3+/5 throughout hips, knees and ankles)  Communication      Cognition Arousal/Alertness: Awake/alert Behavior During Therapy: WFL for tasks assessed/performed Overall Cognitive Status: Within Functional Limits for tasks assessed                      General Comments General comments (skin integrity, edema, etc.): L chest tube site; reddened areas over bilat heels (wearing pressure-relieving boots when outside of  therapy)    Exercises        Assessment/Plan    PT Assessment Patient needs continued PT services  PT Diagnosis Difficulty walking;Generalized weakness;Acute pain   PT Problem List Decreased strength;Decreased balance;Decreased mobility;Decreased safety awareness;Decreased knowledge of use of DME;Decreased cognition  PT Treatment Interventions DME instruction;Gait training;Therapeutic activities;Therapeutic exercise;Balance training   PT Goals (Current goals can be found in the Care Plan section) Acute Rehab PT Goals Patient Stated Goal: wants to get back to her normal independent life PT Goal Formulation: With patient Time For Goal Achievement: 02/21/15 Potential to Achieve Goals: Fair    Frequency Min 2X/week   Barriers to discharge        Co-evaluation               End of Session Equipment Utilized During Treatment: Oxygen Activity Tolerance: Patient tolerated treatment well Patient left: in chair;with call bell/phone within reach Nurse Communication: Mobility status (level of assist, functional performance, O2 response to activity)         Time: 1025-1050 PT Time Calculation (min) (ACUTE ONLY): 25 min   Charges:   PT Evaluation $PT Re-evaluation: 1 Procedure     PT G Codes:        Fernandez Kenley H. Manson Passey, PT, DPT, NCS 02/11/2015, 4:20 PM [phone]

## 2015-02-12 ENCOUNTER — Inpatient Hospital Stay: Payer: Medicare Other

## 2015-02-12 LAB — BASIC METABOLIC PANEL
ANION GAP: 3 — AB (ref 5–15)
BUN: 9 mg/dL (ref 6–20)
CHLORIDE: 100 mmol/L — AB (ref 101–111)
CO2: 29 mmol/L (ref 22–32)
Calcium: 7.4 mg/dL — ABNORMAL LOW (ref 8.9–10.3)
Creatinine, Ser: 0.92 mg/dL (ref 0.44–1.00)
GFR calc Af Amer: >60 mL/min (ref >60)
GFR calc non Af Amer: >60 mL/min (ref >60)
GLUCOSE: 77 mg/dL (ref 65–99)
POTASSIUM: 5 mmol/L (ref 3.5–5.1)
Sodium: 132 mmol/L — ABNORMAL LOW (ref 135–145)

## 2015-02-12 LAB — GLUCOSE, CAPILLARY
GLUCOSE-CAPILLARY: 73 mg/dL (ref 65–99)
GLUCOSE-CAPILLARY: 77 mg/dL (ref 65–99)
GLUCOSE-CAPILLARY: 203 mg/dL — AB (ref 65–99)
GLUCOSE-CAPILLARY: 103 mg/dL — AB (ref 65–99)

## 2015-02-12 LAB — PHOSPHORUS: Phosphorus: 2.7 mg/dL (ref 2.5–4.6)

## 2015-02-12 LAB — MAGNESIUM: Magnesium: 1.8 mg/dL (ref 1.7–2.4)

## 2015-02-12 NOTE — Progress Notes (Signed)
[facility] - [place] at [facility]   PATIENT NAME: [name]    MR#:  [id number]  DATE OF BIRTH:  1945-06-12  SUBJECTIVE:  CHIEF COMPLAINT:   Chief Complaint  Patient presents with  . Hypotension  some chest discomfort at the site of chest tube. Husband at bedside. Wanting to eat REVIEW OF SYSTEMS:  Review of Systems  Constitutional: Negative for fever, weight loss, malaise/fatigue and diaphoresis.  HENT: Negative for ear discharge, ear pain, hearing loss, nosebleeds, sore throat and tinnitus.   Eyes: Negative for blurred vision and pain.  Respiratory: Negative for cough, hemoptysis, shortness of breath and wheezing.   Cardiovascular: Positive for chest pain. Negative for palpitations, orthopnea and leg swelling.  Gastrointestinal: Negative for heartburn, nausea, vomiting, abdominal pain, diarrhea, constipation and blood in stool.  Genitourinary: Negative for dysuria, urgency and frequency.  Musculoskeletal: Negative for myalgias and back pain.  Skin: Negative for itching and rash.  Neurological: Negative for dizziness, tingling, tremors, focal weakness, seizures, weakness and headaches.  Psychiatric/Behavioral: Negative for depression. The patient is not nervous/anxious.    DRUG ALLERGIES:  No Known Allergies VITALS:  Blood pressure 98/60, pulse 90, temperature 98.6 F (37 C), temperature source Oral, resp. rate 25, height  (1.549 m), weight 142 lb 6.7 oz (64.6 kg), SpO2 100 %. PHYSICAL EXAMINATION:  Physical Exam  Constitutional: She is oriented to person, place, and time and well-developed, well-nourished, and in no distress.  HENT:  Head: Normocephalic and atraumatic.  Eyes: Conjunctivae and EOM are normal. Pupils are equal, round, and reactive to light.  Neck: Normal range of motion. Neck supple. No tracheal deviation present. No thyromegaly present.  Cardiovascular: Normal rate, regular rhythm and normal heart sounds.    Pulmonary/Chest: Effort normal and breath sounds normal. No respiratory distress. She has no wheezes. She exhibits no tenderness.  Left-sided chest tube in place under waterseal  Abdominal: Soft. Bowel sounds are normal. She exhibits no distension. There is no tenderness.  Musculoskeletal: Normal range of motion.  Neurological: She is alert and oriented to person, place, and time. No cranial nerve deficit.  Skin: Skin is warm and dry. No rash noted.  Psychiatric: Mood and affect normal.   LABORATORY PANEL:   CBC  Recent Labs Lab 02/08/15 0459  WBC 11.4*  HGB 8.8*  HCT 27.1*  PLT 194   ------------------------------------------------------------------------------------------------------------------ Chemistries   Recent Labs Lab 02/12/15 0540  NA 132*  K 5.0  CL 100*  CO2 29  GLUCOSE 77  BUN 9  CREATININE 0.92  CALCIUM 7.4*  MG 1.8   RADIOLOGY:  Dg Chest 1 View  02/12/2015   CLINICAL DATA:  Follow-up for left-sided pneumothorax.  EXAM: CHEST  1 VIEW  COMPARISON:  02/11/2015  FINDINGS: Right IJ central line, tip at the lower SVC.  A trace left apical pneumothorax has similar size.  Unchanged hazy appearance of bases consistent with pleural fluid, blunting the cardiophrenic sulci. There is also lung opacity, with air bronchograms medially on the left. Stable cardiomegaly.  IMPRESSION: 1. Stable trace left apical pneumothorax. 2. Unchanged bibasilar atelectasis and pleural effusions with superimposed pneumonia not excluded.   Electronically Signed   By: Marnee Spring M.D.   On: 02/12/2015 08:04   ASSESSMENT AND PLAN:  * DKA (diabetic ketoacidoses) -resolved. Sugars today on the 120s range on her current regimen of Lantus + NovoLog.  Endocrinology following.  Can likely restart insulin pump once clinically better  * hypotension: likely at her baseline, she  and he husband states she normally runs her BP in 90-100s  * septic shock: due to cdiff colitis.  Improving  * C.  difficile colitis: continue PO Flagyl  * hypothermia: resolved  * pneumonia: Treated  * Depression: On modafanil (started 9/11). Responding well  * Acute renal failure with hyponatremia: Resolved  * Spontaneous pneumothorax: Could be from recent rib injury, continue chest tube to water seal, surgery is managing it, may come out soon  * Hypothyroidism - continue thyroid replacement.   * Severe protein calorie Malnutrition: Likely due to poor by mouth intake, dietary following. continue Carnation instant breakfast  * Chest pain: morphine prn for pain control.   Transfer to medical floor with off unit telemetry.  All the records are reviewed and case discussed with Care Management/Social Worker. Management plans discussed with the patient, nursing and to [facility] - they are in agreement.  CODE STATUS: DO NOT RESUSCITATE  TOTAL TIME CRITICAL CARE TAKING CARE OF THIS PATIENT: 35 minutes.   More than 50% of the time was spent in counseling/coordination of care: YES  POSSIBLE D/C IN 1-2 DAYS, DEPENDING ON CLINICAL CONDITION.  has a transfer order written for more than 24 hours and has been waiting for floor bed   [facility], Abdurrahman Petersheim M.D on 02/12/2015 at 1:51 PM  Between 7am to 6pm - Pager - [phone]  After 6pm go to [email or web address] - password [email or web address] [facility]  [place] [facility]  Office  [phone]  CC: Primary care physician; Lauro Regulus., MD

## 2015-02-12 NOTE — Care Management Note (Signed)
Case Management Note  Patient Details  Name: JUANISHA BAUTCH MRN: [id number] Date of Birth: [birth year]  Subjective/Objective:   Patient has been accepted at [facility]. It is anticipated that CT will be removed today. Discharge when medically stable in next 1-2 days                 Action/Plan: Malvin Johns  Expected Discharge Date:                  Expected Discharge Plan:  Skilled Nursing Facility  In-House Referral:  Clinical Social Work  Discharge planning Services     Post Acute Care Choice:    Choice offered to:     DME Arranged:    DME Agency:     HH Arranged:    HH Agency:     Status of Service:  In process, will continue to follow  Medicare Important Message Given:  Yes-second notification given Date Medicare IM Given:    Medicare IM give by:    Date Additional Medicare IM Given:    Additional Medicare Important Message give by:     If discussed at Long Length of Stay Meetings, dates discussed:    Additional Comments:  Marily Memos, RN 02/12/2015, 8:52 AM

## 2015-02-12 NOTE — Progress Notes (Signed)
Pharmacy Consult for Electrolyte Management   No Known Allergies  Patient Measurements: Height:  (154.9 cm) Weight: 142 lb 6.7 oz (64.6 kg) IBW/kg (Calculated) : 47.8  Vital Signs: Temp: 97.8 F (36.6 C) (09/14 0000) Temp Source: Oral (09/14 0000) BP: 86/50 mmHg (09/14 0500) Pulse Rate: 84 (09/14 0500) Intake/Output from previous day: 09/13 0701 - 09/14 0700 In: 630 [P.O.:630] Out: 700 [Urine:150; Chest Tube:550] Intake/Output from this shift:    Labs: No results for input(s): WBC, HGB, HCT, PLT, APTT, INR in the last 72 hours.  Recent Labs  02/10/15 0521 02/11/15 0429 02/12/15 0540  NA 137 134* 132*  K 4.5 4.9 5.0  CL 108 104 100*  CO2 GLUCOSE 126* 244* 77  BUN CREATININE 0.76 0.77 0.92  CALCIUM 7.5* 7.4* 7.4*  MG 1.4* 1.5* 1.8  PHOS 3.5 2.6 2.7   Estimated Creatinine Clearance: 50.4 mL/min (by C-G formula based on Cr of 0.92).    Recent Labs  02/11/15 1140 02/11/15 1602 02/11/15 2102  GLUCAP 254* 176* 223*    Medical History: Past Medical History  Diagnosis Date  . Diabetes mellitus type 1     (prior saw Dr. Lodema Hong endo, would like to establish locally)  . Generalized headaches     frequent  . Hypothyroidism   . Convulsions/seizures     unknown type, nml EEG  . History of chicken pox   . Anemia   . Vitamin B12 deficiency 03/2012    normal IF    Medications:  Scheduled:  . antiseptic oral rinse  7 mL Mouth Rinse BID  . calcium carbonate  200 mg of elemental calcium Oral BID  . clotrimazole  10 mg Oral 5 X Daily  . enoxaparin (LOVENOX) injection  40 mg Subcutaneous Q24H  . insulin aspart  0-10 Units Subcutaneous TID WC  . insulin aspart  0-6 Units Subcutaneous TID WC  . insulin glargine  6 Units Subcutaneous Daily  . levothyroxine  125 mcg Oral QAC breakfast  . metroNIDAZOLE  500 mg Oral 3 times per day  . modafinil  100 mg Oral Daily  . sodium bicarbonate  650 mg Oral TID  . sodium chloride  3 mL Intravenous  Q12H   Infusions:    PRN: acetaminophen **OR** [DISCONTINUED] acetaminophen, ALPRAZolam, calcium carbonate, ipratropium-albuterol, morphine injection, ondansetron **OR** ondansetron (ZOFRAN) IV, polyvinyl alcohol  Assessment: Phamracy consulted to assist in managing electrolytes in this 69 y/o F with DKA.   Plan:  Phosphorus and magnesium are low so will replace with magnesium sulfate 2 g iv once and sodium phosphate 10 mmol iv once. Will recheck electrolytes with AM labs.  Pharmacy to follow per consult.   9/7 Potassium 3.4 and Calcium 8.2 corrected. Will order KCl 20 mEq IV and 1 gram calcium gluconate IV. F/u labs in AM.  9/8 Potassium 3.4, Mg 1.6, and Ca 7.7. KCl 20 mEq, calcium gluconate 1 gram and magnesium sulfate 2 grams ordered. F/u labs in AM.  9/9 Potassium 3.3 and calcium 7.4. KCl 40 mEq and calcium gluconate 2 grams x1 ordered. F/u labs in AM.  9/10 Electrolytes WNL except Mg 1.6. Corrected calcium 9.5 for albumin of 1.7. Magnesium 2 grams ordered. Recheck in AM.  9/11 Electrolytes WNL, including corrected calcium.. Recheck labs in AM.  9/12 Electrolytes WNL except Mg 1.4. 2 grams magnesium sulfate x1 ordered. Recheck in AM.  0913 AM Na 134, K 4.9, Ca 7.4 but adjusted 9.2, phos 2.6,  Mg 1.5. Ordered magnesium 4 gm IV x 1, will recheck in AM.  0914 AM electrolytes within normal limits will recheck in AM.  Carola Frost, [facility] Clinical Pharmacist   02/12/2015,6:26 AM

## 2015-02-12 NOTE — Progress Notes (Signed)
Paged Dr Tedd Sias for clarification of insulin sliding scale order; asked for clarification of sliding scales, because there were 2 scales; Dr stated to d/c scale of 0-6 units depending on carbs eaten; stated to keep other scale, that it was correct

## 2015-02-12 NOTE — Progress Notes (Signed)
Her chest x-ray demonstrated minimal apical pneumothorax and she has no leak in her Pleur-evac. Chest tube was removed without difficulty. We will plan follow-up chest x-ray tomorrow.

## 2015-02-12 NOTE — Progress Notes (Signed)
Physical Therapy Treatment Patient Details Name: [name] MRN: [id number] DOB: 02/03/1946 Today's Date: 02/12/2015    History of Present Illness patient presented to ER from [facility] secondary to hypotension; admitted with DKA, dehydration.  Hospital course significant for placement of L chest tube due to spontaneous pneumothorax (9/11); orders to resume PT received this date (9/13).  Of note, patient with recent hospitalization (12/2014) status post fall with L rib fractures; discharged to [facility].    PT Comments    Patient tolerating slight increase in activity level this date, but unable to initiate gait training due to fatigue.  Vitals stable and WFL.  Per chart, scheduled for CT removal this date.  Will plan to continue additional gait efforts next session as tolerated.   Follow Up Recommendations  SNF     Equipment Recommendations       Recommendations for Other Services       Precautions / Restrictions Precautions Precautions: Fall Precaution Comments: L chest tube; enteric isolation; R IJ central line; previous L rib fractures Restrictions Weight Bearing Restrictions: No    Mobility  Bed Mobility Overal bed mobility: Needs Assistance Bed Mobility: Supine to Sit     Supine to sit: Min assist;Mod assist     General bed mobility comments: assist for truncal elevation and chest tube management  Transfers Overall transfer level: Needs assistance Equipment used: Rolling walker (2 wheeled) Transfers: Sit to/from Stand Sit to Stand: Min assist         General transfer comment: cuing for hand placement; assist for lift off and dynamic balance. Mild reports of dizziness with initial transition; improved with accommodation to position  Ambulation/Gait                 Stairs            Wheelchair Mobility    Modified Rankin (Stroke Patients Only)       Balance Overall balance assessment: Needs assistance Sitting-balance support: No upper  extremity supported;Feet supported Sitting balance-Leahy Scale: Fair     Standing balance support: Bilateral upper extremity supported Standing balance-Leahy Scale: Fair Standing balance comment: posterior LOB, min assist from therapist for recovery                    Cognition Arousal/Alertness: Awake/alert Behavior During Therapy: WFL for tasks assessed/performed Overall Cognitive Status: Within Functional Limits for tasks assessed                      Exercises Other Exercises Other Exercises: Supine LE therex, 1x10, AROM for muscular strength/endurance: ankle pumps, quad sets, heel slides, hip abduct/adduct, SAQs.  Support provided under bilat heels to minimize friction to heels with therex. Other Exercises: Sit/stand x3 with RW, min assist for lift off and standing balance.  Posterior weight shift with all standing activities.  Tolerating static stance only 8-10 seconds each trial prior to need for seated rest.    General Comments        Pertinent Vitals/Pain Pain Assessment: Faces Faces Pain Scale: Hurts little more Pain Location: L chest Pain Descriptors / Indicators: Aching;Sore Pain Intervention(s): Limited activity within patient's tolerance;Monitored during session;Repositioned    Home Living                      Prior Function            PT Goals (current goals can now be found in the care plan section) Acute Rehab PT  Goals Patient Stated Goal: wants to get back to her normal independent life PT Goal Formulation: With patient Time For Goal Achievement: 02/21/15 Potential to Achieve Goals: Fair Progress towards PT goals: Progressing toward goals    Frequency  Min 2X/week    PT Plan Current plan remains appropriate    Co-evaluation             End of Session Equipment Utilized During Treatment: Oxygen Activity Tolerance: Patient tolerated treatment well Patient left: in bed;with call bell/phone within reach     Time:  1151-1228 PT Time Calculation (min) (ACUTE ONLY): 37 min  Charges:  $Therapeutic Exercise: 8-22 mins $Therapeutic Activity: 8-22 mins                    G Codes:      Iver Miklas H. Manson Passey, PT, DPT, NCS 02/12/2015, 3:06 PM [phone]

## 2015-02-12 NOTE — Progress Notes (Signed)
She feels well. Sugars today in the 73-223 range on her current regimen of Lantus + NovoLog. She reports fair appetite, at baseline. Eating 50% of meals by her recall. No N/V/abd pain. Still with left CT for pneumothorax. She reports good pain control. Daughter at bedside, who has brought in all her pump supplies (insulin with nursing staff). She prefers to wait to restart insulin pump.   Medical history Past Medical History  Diagnosis Date  . Diabetes mellitus type 1     (prior saw Dr. Lodema Hong endo, would like to establish locally)  . Generalized headaches     frequent  . Hypothyroidism   . Convulsions/seizures     unknown type, nml EEG  . History of chicken pox   . Anemia   . Vitamin B12 deficiency 03/2012    normal IF     Surgical history Past Surgical History  Procedure Laterality Date  . Cesarean section  1983     Medications . antiseptic oral rinse  7 mL Mouth Rinse BID  . calcium carbonate  200 mg of elemental calcium Oral BID  . clotrimazole  10 mg Oral 5 X Daily  . enoxaparin (LOVENOX) injection  40 mg Subcutaneous Q24H  . insulin aspart  0-10 Units Subcutaneous TID WC  . insulin aspart  0-6 Units Subcutaneous TID WC  . insulin glargine  6 Units Subcutaneous Daily  . levothyroxine  125 mcg Oral QAC breakfast  . metroNIDAZOLE  500 mg Oral 3 times per day  . modafinil  100 mg Oral Daily  . sodium bicarbonate  650 mg Oral TID  . sodium chloride  3 mL Intravenous Q12H    Review of systems CV: no chest pain or palpitations ABD: no abdominal pain or nausea or vomiting   Physical Exam BP 98/60 mmHg  Pulse 90  Temp(Src) 98.6 F (37 C) (Oral)  Resp 25  Ht  (1.549 m)  Wt 64.6 kg (142 lb 6.7 oz)  BMI 26.92 kg/m2  SpO2 100%  GEN: well developed, well nourished female, in NAD. HEENT: No proptosis, EOMI, lid lag or stare. Oropharynx is clear.  NECK: supple, trachea midline.   RESPIRATORY: clear bilaterally, no wheeze, good inspiratory effort. Left sided CT  in place. CV: No carotid bruits, RRR. MUSCULOSKELETAL: no clubbing, no tremor ABD: soft, NT/ND, no organomegaly EXT: no peripheral edema SKIN: no dermatopathy or rash or acanthosis nigricans.  LYMPH: no submandibular or supraclavicular LAD PSYC: alert and oriented, fair insight  Labs Results for orders placed or performed during the hospital encounter of 02/05/2015 (from the past 24 hour(s))  Glucose, capillary     Status: Abnormal   Collection Time: 02/11/15  4:02 PM  Result Value Ref Range   Glucose-Capillary 176 (H) 65 - 99 mg/dL  Glucose, capillary     Status: Abnormal   Collection Time: 02/11/15  9:02 PM  Result Value Ref Range   Glucose-Capillary 223 (H) 65 - 99 mg/dL  Basic metabolic panel     Status: Abnormal   Collection Time: 02/12/15  5:40 AM  Result Value Ref Range   Sodium 132 (L) 135 - 145 mmol/L   Potassium 5.0 3.5 - 5.1 mmol/L   Chloride 100 (L) 101 - 111 mmol/L   CO2 29 22 - 32 mmol/L   Glucose, Bld 77 65 - 99 mg/dL   BUN 9 6 - 20 mg/dL   Creatinine, Ser 7.82 0.44 - 1.00 mg/dL   Calcium 7.4 (L) 8.9 - 10.3 mg/dL  GFR calc non Af Amer >60 >60 mL/min   GFR calc Af Amer >60 >60 mL/min   Anion gap 3 (L) 5 - 15  Magnesium     Status: None   Collection Time: 02/12/15  5:40 AM  Result Value Ref Range   Magnesium 1.8 1.7 - 2.4 mg/dL  Phosphorus     Status: None   Collection Time: 02/12/15  5:40 AM  Result Value Ref Range   Phosphorus 2.7 2.5 - 4.6 mg/dL  Glucose, capillary     Status: None   Collection Time: 02/12/15  7:33 AM  Result Value Ref Range   Glucose-Capillary 73 65 - 99 mg/dL  Glucose, capillary     Status: None   Collection Time: 02/12/15 11:11 AM  Result Value Ref Range   Glucose-Capillary 77 65 - 99 mg/dL     Assessment Type 1 diabetes with hyperglycemia C.diff colitis Left spontaneous pneumothorax  Plan -Continue current regimen of Lantus + NovoLog -Continue current diet orders -As she still has the chest tube in place and has limited  mobility, I agree with the patient that the insulin pump should not be restarted today. She does have all her necessary pump supplies here. I anticipate restarting insulin pump tomorrow if she remains clinically stable.  -Consider monitoring in hospital for 24 hrs after insulin pump is restarted to ensure fair glucose stability, as this was her 3rd hospitalization in last 4 months for blood sugar issues (hypoglycemia or DKA).

## 2015-02-12 NOTE — Progress Notes (Signed)
Patient with bed assignment given.  Patient updated.  Report given to receiving RN, Casimiro Needle with no further questions.  Patient alert and oriented, vital signs stable.  Denies pain/discomfort throughout shift.  Patient belonings packed to go with patient.

## 2015-02-13 ENCOUNTER — Inpatient Hospital Stay: Payer: Medicare Other

## 2015-02-13 LAB — CULTURE, BLOOD (ROUTINE X 2)
CULTURE: NO GROWTH
Culture: NO GROWTH

## 2015-02-13 LAB — BASIC METABOLIC PANEL
ANION GAP: 2 — AB (ref 5–15)
BUN: 9 mg/dL (ref 6–20)
CO2: 29 mmol/L (ref 22–32)
Calcium: 7.4 mg/dL — ABNORMAL LOW (ref 8.9–10.3)
Chloride: 97 mmol/L — ABNORMAL LOW (ref 101–111)
Creatinine, Ser: 1.02 mg/dL — ABNORMAL HIGH (ref 0.44–1.00)
GFR calc Af Amer: >60 mL/min (ref >60)
GFR calc non Af Amer: 55 mL/min — ABNORMAL LOW (ref >60)
GLUCOSE: 41 mg/dL — AB (ref 65–99)
POTASSIUM: 5.3 mmol/L — AB (ref 3.5–5.1)
Sodium: 128 mmol/L — ABNORMAL LOW (ref 135–145)

## 2015-02-13 LAB — GLUCOSE, CAPILLARY
GLUCOSE-CAPILLARY: 36 mg/dL — AB (ref 65–99)
GLUCOSE-CAPILLARY: 140 mg/dL — AB (ref 65–99)
GLUCOSE-CAPILLARY: 128 mg/dL — AB (ref 65–99)
GLUCOSE-CAPILLARY: 141 mg/dL — AB (ref 65–99)
Glucose-Capillary: 142 mg/dL — ABNORMAL HIGH (ref 65–99)
Glucose-Capillary: 79 mg/dL (ref 65–99)

## 2015-02-13 LAB — PHOSPHORUS: Phosphorus: 3 mg/dL (ref 2.5–4.6)

## 2015-02-13 LAB — MAGNESIUM: Magnesium: 1.3 mg/dL — ABNORMAL LOW (ref 1.7–2.4)

## 2015-02-13 MED ORDER — SODIUM CHLORIDE 0.9 % IV SOLN
INTRAVENOUS | Status: DC
  Administered 2015-02-13 – 2015-02-14 (×2): via INTRAVENOUS

## 2015-02-13 MED ORDER — INSULIN GLARGINE 100 UNIT/ML ~~LOC~~ SOLN
4.0000 [IU] | Freq: Every day | SUBCUTANEOUS | Status: DC
  Filled 2015-02-13: qty 0.04

## 2015-02-13 MED ORDER — INSULIN PUMP
Freq: Three times a day (TID) | SUBCUTANEOUS | Status: DC
  Administered 2015-02-14: 09:00:00 via SUBCUTANEOUS
  Filled 2015-02-13: qty 1

## 2015-02-13 MED ORDER — MAGNESIUM SULFATE 4 GM/100ML IV SOLN
4.0000 g | Freq: Once | INTRAVENOUS | Status: AC
  Administered 2015-02-13: 4 g via INTRAVENOUS
  Filled 2015-02-13: qty 100

## 2015-02-13 MED ORDER — DEXTROSE 50 % IV SOLN
INTRAVENOUS | Status: AC
  Administered 2015-02-13: 50 mL
  Filled 2015-02-13: qty 50

## 2015-02-13 NOTE — Progress Notes (Signed)
ENDOCRINOLOGY Follow up  CHIEF COMPLAINT: Type 1 DM on insulin pump  HPI: 69 y.o. female seen in consultation for diabetes mellitus, type 1 on insulin pump therapy admitted with septic shock and DKA, now resolved.   She had hypoglycemia this am. Reports some nausea and vomiting. Food intake has been poor. She received dextrose and BG has been ok since. Lantus was last given yesterday at 6 pm. She has all of her insulin pump supplies and insulin.   CURRENT MEDICATIONS:  . antiseptic oral rinse  7 mL Mouth Rinse BID  . calcium carbonate  200 mg of elemental calcium Oral BID  . clotrimazole  10 mg Oral 5 X Daily  . enoxaparin (LOVENOX) injection  40 mg Subcutaneous Q24H  . insulin aspart  0-10 Units Subcutaneous TID WC  . levothyroxine  125 mcg Oral QAC breakfast  . metroNIDAZOLE  500 mg Oral 3 times per day  . modafinil  100 mg Oral Daily  . sodium bicarbonate  650 mg Oral TID  . sodium chloride  3 mL Intravenous Q12H    ALLERGIES:  No Known Allergies  PHYSICAL EXAMINATION:  BP 95/58 mmHg  Pulse 95  Temp(Src) 98.6 F (37 C) (Oral)  Resp 18  Ht  (1.549 m)  Wt 63.64 kg (140 lb 4.8 oz)  BMI 26.52 kg/m2  SpO2 98%  GENERAL: AAOx3 PULMONARY: breathing unlabored on room air.  ABDOMEN:  Diffusely soft, nontender, nondistended   EXTREMITIES:  No peripheral edema is present.    SKIN:  Warm, dry.   LABORATORY DATA:  01/17/15 -- Hgb A1c 10.7%  Results for orders placed or performed during the hospital encounter of 02/11/2015 (from the past 24 hour(s))  Glucose, capillary     Status: Abnormal   Collection Time: 02/12/15  5:01 PM  Result Value Ref Range   Glucose-Capillary 203 (H) 65 - 99 mg/dL  Glucose, capillary     Status: Abnormal   Collection Time: 02/12/15  9:19 PM  Result Value Ref Range   Glucose-Capillary 103 (H) 65 - 99 mg/dL   Comment 1 Notify RN   Basic metabolic panel     Status: Abnormal   Collection Time: 02/13/15  5:01 AM  Result Value Ref Range   Sodium 128  (L) 135 - 145 mmol/L   Potassium 5.3 (H) 3.5 - 5.1 mmol/L   Chloride 97 (L) 101 - 111 mmol/L   CO2 29 22 - 32 mmol/L   Glucose, Bld 41 (LL) 65 - 99 mg/dL   BUN 9 6 - 20 mg/dL   Creatinine, Ser 9.60 (H) 0.44 - 1.00 mg/dL   Calcium 7.4 (L) 8.9 - 10.3 mg/dL   GFR calc non Af Amer 55 (L) >60 mL/min   GFR calc Af Amer >60 >60 mL/min   Anion gap 2 (L) 5 - 15  Magnesium     Status: Abnormal   Collection Time: 02/13/15  5:01 AM  Result Value Ref Range   Magnesium 1.3 (L) 1.7 - 2.4 mg/dL  Phosphorus     Status: None   Collection Time: 02/13/15  5:01 AM  Result Value Ref Range   Phosphorus 3.0 2.5 - 4.6 mg/dL  Glucose, capillary     Status: Abnormal   Collection Time: 02/13/15  6:21 AM  Result Value Ref Range   Glucose-Capillary 36 (LL) 65 - 99 mg/dL   Comment 1 Notify RN   Glucose, capillary     Status: Abnormal   Collection Time: 02/13/15  7:11  AM  Result Value Ref Range   Glucose-Capillary 140 (H) 65 - 99 mg/dL  Glucose, capillary     Status: Abnormal   Collection Time: 02/13/15  7:47 AM  Result Value Ref Range   Glucose-Capillary 128 (H) 65 - 99 mg/dL  Glucose, capillary     Status: Abnormal   Collection Time: 02/13/15 11:29 AM  Result Value Ref Range   Glucose-Capillary 141 (H) 65 - 99 mg/dL    ASSESSMENT:  Type 1 DM Hypoglycemia Hypothyroidism, stable   PLAN: 1. Mental status is at baseline so she will resume insulin pump therapy.  Insulin pump settings reviewed and appropriate, see below:   Basal rates: 0000 0.30 units/hr 0800  0.35 u/hr 2200 0.25 units/hr  Bolus settings Insulin:carb ratio 1:21 Sensitivity:  90 BG target: 140 Insulin on board: 5 hours  2. Discontinue Lantus and ISS novolog. 3. BG checks AC/HS and 2am. 3. Continue levothyroxine daily.  Will follow along with you. Please page with any concerns.   Doylene Canning, MD [facility] Endocrinology

## 2015-02-13 NOTE — Progress Notes (Signed)
Pt morning blood sugar 41. This nurse rechecked to be 36. Spoke with Dr.Reddy and given 1 amp of D50. Will cont to monitor. Pt A&O and responsive. Cont to monitor.

## 2015-02-13 NOTE — Progress Notes (Signed)
Inpatient Diabetes Program Recommendations  AACE/ADA: New Consensus Statement on Inpatient Glycemic Control (2015)  Target Ranges:  Prepandial:   less than 140 mg/dL      Peak postprandial:   less than 180 mg/dL (1-2 hours)      Critically ill patients:  140 - 180 mg/dL   Met with patient at the bedside- she has reinserted her insulin pump.  She had some difficulty priming the tubing but was successful in inserting the site.  Pump was set by Dr. Graceann Congress.    Rates per her notes; Basal rates: 0000 0.30 units/hr 0800 0.35 u/hr 2200 0.25 units/hr  Bolus settings Insulin:carb ratio 1:21 Sensitivity: 90 BG target: 140 Insulin on board: 5 hours  Patient  states she cannot see the screen of the insulin pump, that it is blurry but is able to demonstrate that she knows where the buttons are and was able to show me where to enter her blood sugars.  She reports that she does not change her own basal rates.   Milagros Reap at endocrinology department of [facility]- I have asked Pamala Hurry to ask Dr. Graceann Congress to evaluate the patient before she leaves for the day.  Gentry Fitz, RN, BA, MHA, CDE Diabetes Coordinator Inpatient Diabetes Program  [phone] (Team Pager) [phone] ([place]) 02/13/2015 3:30 PM

## 2015-02-13 NOTE — Progress Notes (Signed)
Inpatient Diabetes Program Recommendations  AACE/ADA: New Consensus Statement on Inpatient Glycemic Control (2015)  Target Ranges:  Prepandial:   less than 140 mg/dL      Peak postprandial:   less than 180 mg/dL (1-2 hours)      Critically ill patients:  140 - 180 mg/dL   Review of Glycemic Control; hypoglycemia  Diabetes history: Type 1 Outpatient Diabetes medications:Novolog insulin via insulin pump Current orders for Inpatient glycemic control: Novolog insulin 0-10 units tid with meals  Inpatient Diabetes Program Recommendations:  Patient has Type 1 diabetes and will require basal insulin reordered. Consider Lantus 4 units qhs.   Susette Racer, RN, BA, MHA, CDE Diabetes Coordinator Inpatient Diabetes Program  [phone] (Team Pager) [phone] [facility] Office) 02/13/2015 11:57 AM

## 2015-02-13 NOTE — Progress Notes (Signed)
Pharmacy Consult for Electrolyte Management   No Known Allergies  Patient Measurements: Height:  (154.9 cm) Weight: 142 lb 6.7 oz (64.6 kg) IBW/kg (Calculated) : 47.8  Vital Signs: Temp: 99.2 F (37.3 C) (09/14 2255) Temp Source: Oral (09/14 2255) BP: 92/54 mmHg (09/14 2315) Pulse Rate: 95 (09/14 2255) Intake/Output from previous day: 09/14 0701 - 09/15 0700 In: 123 [P.O.:120; I.V.:3] Out: 0  Intake/Output from this shift: Total I/O In: 3 [I.V.:3] Out: 0   Labs: No results for input(s): WBC, HGB, HCT, PLT, APTT, INR in the last 72 hours.  Recent Labs  02/11/15 0429 02/12/15 0540 02/13/15 0501  NA 134* 132* 128*  K 4.9 5.0 5.3*  CL 104 100* 97*  CO2 GLUCOSE 244* 77 41*  BUN CREATININE 0.77 0.92 1.02*  CALCIUM 7.4* 7.4* 7.4*  MG 1.5* 1.8 1.3*  PHOS 2.6 2.7 3.0   Estimated Creatinine Clearance: 45.4 mL/min (by C-G formula based on Cr of 1.02).    Recent Labs  02/12/15 1111 02/12/15 1701 02/12/15 2119  GLUCAP 77 203* 103*    Medical History: Past Medical History  Diagnosis Date  . Diabetes mellitus type 1     (prior saw Dr. Lodema Hong endo, would like to establish locally)  . Generalized headaches     frequent  . Hypothyroidism   . Convulsions/seizures     unknown type, nml EEG  . History of chicken pox   . Anemia   . Vitamin B12 deficiency 03/2012    normal IF    Medications:  Scheduled:  . antiseptic oral rinse  7 mL Mouth Rinse BID  . calcium carbonate  200 mg of elemental calcium Oral BID  . clotrimazole  10 mg Oral 5 X Daily  . dextrose      . enoxaparin (LOVENOX) injection  40 mg Subcutaneous Q24H  . insulin aspart  0-10 Units Subcutaneous TID WC  . insulin glargine  6 Units Subcutaneous Daily  . levothyroxine  125 mcg Oral QAC breakfast  . metroNIDAZOLE  500 mg Oral 3 times per day  . modafinil  100 mg Oral Daily  . sodium bicarbonate  650 mg Oral TID  . sodium chloride  3 mL Intravenous Q12H   Infusions:     PRN: acetaminophen **OR** [DISCONTINUED] acetaminophen, ALPRAZolam, calcium carbonate, ipratropium-albuterol, morphine injection, ondansetron **OR** ondansetron (ZOFRAN) IV, polyvinyl alcohol  Assessment: Phamracy consulted to assist in managing electrolytes in this 69 y/o F with DKA.   Plan:  Phosphorus and magnesium are low so will replace with magnesium sulfate 2 g iv once and sodium phosphate 10 mmol iv once. Will recheck electrolytes with AM labs.  Pharmacy to follow per consult.   9/7 Potassium 3.4 and Calcium 8.2 corrected. Will order KCl 20 mEq IV and 1 gram calcium gluconate IV. F/u labs in AM.  9/8 Potassium 3.4, Mg 1.6, and Ca 7.7. KCl 20 mEq, calcium gluconate 1 gram and magnesium sulfate 2 grams ordered. F/u labs in AM.  9/9 Potassium 3.3 and calcium 7.4. KCl 40 mEq and calcium gluconate 2 grams x1 ordered. F/u labs in AM.  9/10 Electrolytes WNL except Mg 1.6. Corrected calcium 9.5 for albumin of 1.7. Magnesium 2 grams ordered. Recheck in AM.  9/11 Electrolytes WNL, including corrected calcium.. Recheck labs in AM.  9/12 Electrolytes WNL except Mg 1.4. 2 grams magnesium sulfate x1 ordered. Recheck in AM.  0913 AM Na 134, K 4.9, Ca 7.4 but adjusted 9.2,  phos 2.6, Mg 1.5. Ordered magnesium 4 gm IV x 1, will recheck in AM.  0914 AM electrolytes within normal limits will recheck in AM.  0915 AM Na 128, K 5.3, Ca 7.4, Phos 3, Mg 1.3. Give magnesium 4 gm IV x 1 now recheck in the morning.    Carola Frost, [facility] Clinical Pharmacist   02/13/2015,6:25 AM

## 2015-02-13 NOTE — Care Management Important Message (Signed)
Important Message  Patient Details  Name: [name] MRN: [id number] Date of Birth: 1946-02-23   Medicare Important Message Given:  IM given   Adonis Huguenin, RN 02/13/2015, 3:47 PM

## 2015-02-13 NOTE — Progress Notes (Signed)
Patient ID: [name], female   DOB: 01/30/1946, 69 y.o.   MRN: [id number] [facility] PROGRESS NOTE  PCP: Lauro Regulus., MD  HPI/Subjective: Patient vomited when I walked in the room. She states that this is the first time vomiting. No abdominal pain and no diarrhea. This morning she also had a low sugar. Chest tube was removed yesterday afternoon.  Objective: Filed Vitals:   02/13/15 0500  BP: 100/58  Pulse: 94  Temp: 98.6 F (37 C)  Resp: 20    Filed Weights   02/11/15 0510 02/12/15 0619 02/13/15 0500  Weight: 66.2 kg (145 lb 15.1 oz) 64.6 kg (142 lb 6.7 oz) 63.64 kg (140 lb 4.8 oz)    ROS: Review of Systems  Constitutional: Negative for fever and chills.  Eyes: Negative for blurred vision.  Respiratory: Negative for cough and shortness of breath.   Cardiovascular: Negative for chest pain.  Gastrointestinal: Positive for nausea and vomiting. Negative for abdominal pain, diarrhea and constipation.  Genitourinary: Negative for dysuria.  Musculoskeletal: Negative for joint pain.  Neurological: Negative for dizziness and headaches.   Exam: Physical Exam  Constitutional: She is oriented to person, place, and time.  HENT:  Nose: No mucosal edema.  Mouth/Throat: No oropharyngeal exudate or posterior oropharyngeal edema.  Eyes: Conjunctivae, EOM and lids are normal. Pupils are equal, round, and reactive to light.  Neck: No JVD present. Carotid bruit is not present. No edema present. No thyroid mass and no thyromegaly present.  Cardiovascular: S1 normal and S2 normal.  Exam reveals no gallop.   Murmur heard.  Systolic murmur is present with a grade of 2/6  Pulses:      Dorsalis pedis pulses are 2+ on the right side, and 2+ on the left side.  Respiratory: No respiratory distress. She has no wheezes. She has no rhonchi. She has no rales.  GI: Soft. Bowel sounds are normal. There is no tenderness.  Musculoskeletal:       Right ankle: She exhibits no  swelling.       Left ankle: She exhibits no swelling.  Lymphadenopathy:    She has no cervical adenopathy.  Neurological: She is alert and oriented to person, place, and time. No cranial nerve deficit.  Skin: Skin is warm. No rash noted. Nails show no clubbing.  Psychiatric: She has a normal mood and affect.    Data Reviewed: Basic Metabolic Panel:  Recent Labs Lab 02/28/2015 0507 02/10/15 0521 02/11/15 0429 02/12/15 0540 02/13/15 0501  NA 137 137 134* 132* 128*  K 4.2 4.5 4.9 5.0 5.3*  CL 112* 108 104 100* 97*  CO2 GLUCOSE 121* 126* 244* 77 41*  BUN CREATININE 0.73 0.76 0.77 0.92 1.02*  CALCIUM 7.8* 7.5* 7.4* 7.4* 7.4*  MG 1.7 1.4* 1.5* 1.8 1.3*  PHOS 3.2 3.5 2.6 2.7 3.0   Liver Function Tests:  Recent Labs Lab 02/08/15 0459  ALBUMIN 1.7*   CBC:  Recent Labs Lab 02/08/15 0459  WBC 11.4*  HGB 8.8*  HCT 27.1*  MCV 85.7  PLT 194    CBG:  Recent Labs Lab 02/12/15 1701 02/12/15 2119 02/13/15 0621 02/13/15 0711 02/13/15 0747  GLUCAP 203* 103* 36* 140* 128*       Studies: Dg Chest 1 View  02/12/2015   CLINICAL DATA:  Follow-up for left-sided pneumothorax.  EXAM: CHEST  1 VIEW  COMPARISON:  02/11/2015  FINDINGS: Right IJ central line, tip  at the lower SVC.  A trace left apical pneumothorax has similar size.  Unchanged hazy appearance of bases consistent with pleural fluid, blunting the cardiophrenic sulci. There is also lung opacity, with air bronchograms medially on the left. Stable cardiomegaly.  IMPRESSION: 1. Stable trace left apical pneumothorax. 2. Unchanged bibasilar atelectasis and pleural effusions with superimposed pneumonia not excluded.   Electronically Signed   By: Marnee Spring M.D.   On: 02/12/2015 08:04   Dg Chest 2 View  02/13/2015   CLINICAL DATA:  Subsequent encounter.  Followup left pneumothorax.  EXAM: CHEST  2 VIEW  COMPARISON:  02/12/2015  FINDINGS: Small left apical pneumothorax is unchanged.  There is  persistent hazy opacity, coarse reticular and patchy airspace opacity at the lung bases consistent with a combination of atelectasis and pleural fluid. There is mild bilateral interstitial prominence more diffusely. This is also stable  Cardiac silhouette is normal in size. No convincing mediastinal hilar masses. Right internal jugular central venous line is stable with its tip in the lower superior vena cava.  IMPRESSION: 1. No change in small left apical pneumothorax. 2. No change in bilateral pleural effusions and lung base atelectasis.   Electronically Signed   By: Amie Portland M.D.   On: 02/13/2015 08:31    Scheduled Meds: . antiseptic oral rinse  7 mL Mouth Rinse BID  . calcium carbonate  200 mg of elemental calcium Oral BID  . clotrimazole  10 mg Oral 5 X Daily  . enoxaparin (LOVENOX) injection  40 mg Subcutaneous Q24H  . insulin aspart  0-10 Units Subcutaneous TID WC  . levothyroxine  125 mcg Oral QAC breakfast  . magnesium sulfate 1 - 4 g bolus IVPB  4 g Intravenous Once  . metroNIDAZOLE  500 mg Oral 3 times per day  . modafinil  100 mg Oral Daily  . sodium bicarbonate  650 mg Oral TID  . sodium chloride  3 mL Intravenous Q12H    Assessment/Plan: Principal Problem:   DKA (diabetic ketoacidoses) Active Problems:   Hypothyroidism   ARF (acute renal failure)   Dehydration   Type I diabetes mellitus   SIRS (systemic inflammatory response syndrome)   Broken ribs   Protein-calorie malnutrition, severe   Pneumothorax   Pressure ulcer   1. Hypoglycemia, patient was admitted with diabetic ketoacidosis. I'll await endocrine reevaluation. Hold the Lantus at this point. 2. Nausea and vomiting- Zofran ordered. If this continues I will get an x-ray. 3. Septic shock and C. difficile colitis- by mouth Flagyl 4.  Pneumothorax- likely from recent rib injury. Chest tube removed yesterday. 5. Acute renal failure- resolved 6. Hyponatremia- patient is on sodium bicarbonate will start normal  saline. 7. Hypothyroidism continue levothyroxin 8. Severe protein calorie malnutrition- still watching patient's intake 9. Hypothermia- resolved 10. Hypotension- her blood pressures normally on the lower side.  Code Status:     Code Status Orders        Start     Ordered   02/01/15 1535  Do not attempt resuscitation (DNR)   Continuous    Question Answer Comment  In the event of cardiac or respiratory ARREST Do not call a "code blue"   In the event of cardiac or respiratory ARREST Do not perform Intubation, CPR, defibrillation or ACLS   In the event of cardiac or respiratory ARREST Use medication by any route, position, wound care, and other measures to relive pain and suffering. May use oxygen, suction and manual treatment of airway obstruction  as needed for comfort.      02/01/15 1536     Disposition Plan: Home soon  Consultants:  Surgery  Endocrine  Time spent: 25 minutes  Alford Highland  [facility] Hospitalists

## 2015-02-13 NOTE — Progress Notes (Signed)
Endocrine note: Checked on patient given concerns about her operating her insulin pump. She reports that she can see the screen and input data fine. She was able to demonstrate that she can input her blood glucose reading. She was about to start dinner and knows to bolus according to how many grams of carbohydrate she consumes. She was able to show me how to put that info in as well. Advised she complete her meal before bolusing. She will inform her RN how many grams of carb and how much she boluses for documentation purposes. Ok to continue insulin pump therapy. Dr. Tedd Sias will reevaluate tomorrow.  Doylene Canning, MD [facility] Endocrinology

## 2015-02-13 NOTE — Progress Notes (Signed)
Inpatient Diabetes Program Recommendations  AACE/ADA: New Consensus Statement on Inpatient Glycemic Control (2015)  Target Ranges:  Prepandial:   less than 140 mg/dL      Peak postprandial:   less than 180 mg/dL (1-2 hours)      Critically ill patients:  140 - 180 mg/dL   Review of Glycemic Control  Spoke with patient and her daughter at the bedside. Analeigha does want to go back on her pump- awaiting follow up from endocrinology.  Patient has all her supplies at her bedside and I have retrieved her insulin from ICU that she brought in from home (located in the unit medication fridge).   When endocrinology puts the patient back on the pump, staff will need to; 1. MD places the order for insulin pump management (order set in manage orders)- must be ordered with times such as tid, hs, 0200 verses continuous 2. Retreive the insulin pump contract and flow sheets (found in the summary tab, patient care report, nursing activities and treatment)Have patient sign the contract and have patient complete the flow sheet as she administers insulin 3. Complete the insulin pump management checklist in the flowsheet tab each shift  Susette Racer, RN, [place], [place], CDE Diabetes Coordinator Inpatient Diabetes Program  [phone] (Team Pager) [phone] [facility] Office) 02/13/2015 12:51 PM

## 2015-02-14 ENCOUNTER — Inpatient Hospital Stay: Payer: Medicare Other

## 2015-02-14 ENCOUNTER — Encounter: Payer: Self-pay | Admitting: Internal Medicine

## 2015-02-14 LAB — BASIC METABOLIC PANEL
ANION GAP: 4 — AB (ref 5–15)
ANION GAP: 6 (ref 5–15)
BUN: 9 mg/dL (ref 6–20)
BUN: 10 mg/dL (ref 6–20)
CO2: 26 mmol/L (ref 22–32)
CO2: 24 mmol/L (ref 22–32)
Calcium: 7.2 mg/dL — ABNORMAL LOW (ref 8.9–10.3)
Calcium: 7 mg/dL — ABNORMAL LOW (ref 8.9–10.3)
Chloride: 95 mmol/L — ABNORMAL LOW (ref 101–111)
Chloride: 93 mmol/L — ABNORMAL LOW (ref 101–111)
Creatinine, Ser: 1.22 mg/dL — ABNORMAL HIGH (ref 0.44–1.00)
Creatinine, Ser: 1.25 mg/dL — ABNORMAL HIGH (ref 0.44–1.00)
GFR, EST AFRICAN AMERICAN: 52 mL/min — AB (ref >60)
GFR, EST AFRICAN AMERICAN: 50 mL/min — AB (ref >60)
GFR, EST NON AFRICAN AMERICAN: 44 mL/min — AB (ref >60)
GFR, EST NON AFRICAN AMERICAN: 43 mL/min — AB (ref >60)
GLUCOSE: 114 mg/dL — AB (ref 65–99)
Glucose, Bld: 104 mg/dL — ABNORMAL HIGH (ref 65–99)
POTASSIUM: 5.8 mmol/L — AB (ref 3.5–5.1)
POTASSIUM: 5.4 mmol/L — AB (ref 3.5–5.1)
SODIUM: 125 mmol/L — AB (ref 135–145)
Sodium: 123 mmol/L — ABNORMAL LOW (ref 135–145)

## 2015-02-14 LAB — MAGNESIUM: MAGNESIUM: 1.8 mg/dL (ref 1.7–2.4)

## 2015-02-14 LAB — GLUCOSE, CAPILLARY
GLUCOSE-CAPILLARY: 118 mg/dL — AB (ref 65–99)
GLUCOSE-CAPILLARY: 106 mg/dL — AB (ref 65–99)
GLUCOSE-CAPILLARY: 114 mg/dL — AB (ref 65–99)
GLUCOSE-CAPILLARY: 171 mg/dL — AB (ref 65–99)
Glucose-Capillary: 21 mg/dL — CL (ref 65–99)
Glucose-Capillary: 83 mg/dL (ref 65–99)
Glucose-Capillary: 55 mg/dL — ABNORMAL LOW (ref 65–99)
Glucose-Capillary: 132 mg/dL — ABNORMAL HIGH (ref 65–99)

## 2015-02-14 LAB — PHOSPHORUS: PHOSPHORUS: 3.5 mg/dL (ref 2.5–4.6)

## 2015-02-14 MED ORDER — INSULIN ASPART 100 UNIT/ML ~~LOC~~ SOLN: 1.0000 [IU] | Freq: Three times a day (TID) | SUBCUTANEOUS | Status: DC

## 2015-02-14 MED ORDER — SODIUM CHLORIDE 0.9 % IV SOLN: 1.0000 g | Freq: Once | INTRAVENOUS | Status: DC

## 2015-02-14 MED ORDER — DEXTROSE 50 % IV SOLN
INTRAVENOUS | Status: AC
  Administered 2015-02-14: 50 mL
  Filled 2015-02-14: qty 50

## 2015-02-14 MED ORDER — DEXTROSE 50 % IV SOLN: 50.0000 mL | Freq: Once | INTRAVENOUS | Status: DC

## 2015-02-14 MED ORDER — DEXTROSE 50 % IV SOLN
INTRAVENOUS | Status: AC
  Administered 2015-02-14: 12:00:00
  Filled 2015-02-14: qty 50

## 2015-02-14 MED ORDER — SODIUM POLYSTYRENE SULFONATE 15 GM/60ML PO SUSP
30.0000 g | Freq: Once | ORAL | Status: AC
  Administered 2015-02-14: 30 g via ORAL
  Filled 2015-02-14: qty 120

## 2015-02-14 MED ORDER — SODIUM CHLORIDE 0.9 % IV BOLUS (SEPSIS)
1000.0000 mL | Freq: Once | INTRAVENOUS | Status: AC
  Administered 2015-02-14: 1000 mL via INTRAVENOUS

## 2015-02-14 MED ORDER — INSULIN GLARGINE 100 UNIT/ML ~~LOC~~ SOLN
6.0000 [IU] | Freq: Every day | SUBCUTANEOUS | Status: DC
  Administered 2015-02-14: 6 [IU] via SUBCUTANEOUS
  Filled 2015-02-14 (×2): qty 0.06

## 2015-02-14 MED ORDER — INSULIN ASPART 100 UNIT/ML ~~LOC~~ SOLN: 1.0000 [IU] | Freq: Every day | SUBCUTANEOUS | Status: DC

## 2015-02-14 MED ORDER — SODIUM CHLORIDE 0.9 % IV SOLN
1.0000 g | Freq: Once | INTRAVENOUS | Status: AC
  Administered 2015-02-14: 1 g via INTRAVENOUS
  Filled 2015-02-14: qty 10

## 2015-02-14 MED ORDER — VANCOMYCIN HCL IN DEXTROSE 1-5 GM/200ML-% IV SOLN
1000.0000 mg | Freq: Once | INTRAVENOUS | Status: AC
  Administered 2015-02-14: 1000 mg via INTRAVENOUS
  Filled 2015-02-14: qty 200

## 2015-02-14 NOTE — Progress Notes (Signed)
Patient ID: [name], female   DOB: 08/11/1945, 69 y.o.   MRN: [id number] [facility] PROGRESS NOTE  PCP: Lauro Regulus., MD  HPI/Subjective: Patient seen earlier. Patient vomited again today around noon time. This afternoon spiked a low-grade fever and had some chills. Again had a low blood sugar overnight.  Objective: Filed Vitals:   02/14/15 1327  BP: 93/60  Pulse: 104  Temp: 100.8 F (38.2 C)  Resp: 17    Filed Weights   02/12/15 0619 02/13/15 0500 02/14/15 0529  Weight: 64.6 kg (142 lb 6.7 oz) 63.64 kg (140 lb 4.8 oz) 61.961 kg (136 lb 9.6 oz)    ROS: Review of Systems  Constitutional: Positive for fever and chills.  Eyes: Negative for blurred vision.  Respiratory: Positive for cough. Negative for shortness of breath.   Cardiovascular: Negative for chest pain.  Gastrointestinal: Positive for nausea and vomiting. Negative for abdominal pain, diarrhea and constipation.  Genitourinary: Negative for dysuria.  Musculoskeletal: Negative for joint pain.  Neurological: Negative for dizziness and headaches.   Exam: Physical Exam  Constitutional: She is oriented to person, place, and time.  HENT:  Nose: No mucosal edema.  Mouth/Throat: No oropharyngeal exudate or posterior oropharyngeal edema.  Eyes: Conjunctivae, EOM and lids are normal. Pupils are equal, round, and reactive to light.  Neck: No JVD present. Carotid bruit is not present. No edema present. No thyroid mass and no thyromegaly present.  Cardiovascular: S1 normal and S2 normal.  Exam reveals no gallop.   Murmur heard.  Systolic murmur is present with a grade of 2/6  Pulses:      Dorsalis pedis pulses are 2+ on the right side, and 2+ on the left side.  Respiratory: No respiratory distress. She has no wheezes. She has no rhonchi. She has no rales.  GI: Soft. Bowel sounds are normal. There is no tenderness.  Musculoskeletal:       Right ankle: She exhibits no swelling.       Left  ankle: She exhibits no swelling.  Lymphadenopathy:    She has no cervical adenopathy.  Neurological: She is alert and oriented to person, place, and time. No cranial nerve deficit.  Skin: Skin is warm. No rash noted. Nails show no clubbing.  Psychiatric: She has a normal mood and affect.    Data Reviewed: Basic Metabolic Panel:  Recent Labs Lab 02/10/15 0521 02/11/15 0429 02/12/15 0540 02/13/15 0501 02/14/15 0540  NA 137 134* 132* 128* 125*  K 4.5 4.9 5.0 5.3* 5.8*  CL 108 104 100* 97* 95*  CO2 GLUCOSE 126* 244* 77 41* 104*  BUN CREATININE 0.76 0.77 0.92 1.02* 1.22*  CALCIUM 7.5* 7.4* 7.4* 7.4* 7.2*  MG 1.4* 1.5* 1.8 1.3* 1.8  PHOS 3.5 2.6 2.7 3.0 3.5   Liver Function Tests:  Recent Labs Lab 02/08/15 0459  ALBUMIN 1.7*   CBC:  Recent Labs Lab 02/08/15 0459  WBC 11.4*  HGB 8.8*  HCT 27.1*  MCV 85.7  PLT 194    CBG:  Recent Labs Lab 02/14/15 0517 02/14/15 0738 02/14/15 0938 02/14/15 1123 02/14/15 1258  GLUCAP 118* 106* 83 55* 132*       Studies: Dg Chest 2 View  02/13/2015   CLINICAL DATA:  Subsequent encounter.  Followup left pneumothorax.  EXAM: CHEST  2 VIEW  COMPARISON:  02/12/2015  FINDINGS: Small left apical pneumothorax is unchanged.  There is persistent hazy opacity,  coarse reticular and patchy airspace opacity at the lung bases consistent with a combination of atelectasis and pleural fluid. There is mild bilateral interstitial prominence more diffusely. This is also stable  Cardiac silhouette is normal in size. No convincing mediastinal hilar masses. Right internal jugular central venous line is stable with its tip in the lower superior vena cava.  IMPRESSION: 1. No change in small left apical pneumothorax. 2. No change in bilateral pleural effusions and lung base atelectasis.   Electronically Signed   By: Amie Portland M.D.   On: 02/13/2015 08:31   Dg Abd Portable 2v  02/14/2015   CLINICAL DATA:  Vomiting,  diabetes, hypothyroidism  EXAM: PORTABLE ABDOMEN - 2 VIEW  COMPARISON:  02/13/2015 and earlier studies  FINDINGS: Central line to the SVC RA junction. Bilateral pleural effusions with adjacent atelectasis/ consolidation in the visualized lung bases. Low lung volumes with hazy opacities in visualized lung fields.  No free air. Paucity of small bowel gas. Colon decompressed. Regional bones unremarkable.  IMPRESSION: 1. Nonobstructive bowel gas pattern. 2. Bilateral pleural effusions.   Electronically Signed   By: Corlis Leak M.D.   On: 02/14/2015 13:36    Scheduled Meds: . antiseptic oral rinse  7 mL Mouth Rinse BID  . calcium carbonate  200 mg of elemental calcium Oral BID  . calcium gluconate  1 g Intravenous Once  . clotrimazole  10 mg Oral 5 X Daily  . dextrose  50 mL Intravenous Once  . enoxaparin (LOVENOX) injection  40 mg Subcutaneous Q24H  . insulin aspart  1-3 Units Subcutaneous QHS  . insulin aspart  1-5 Units Subcutaneous TID WC  . insulin glargine  6 Units Subcutaneous QHS  . insulin pump   Subcutaneous TID AC, HS, 0200  . levothyroxine  125 mcg Oral QAC breakfast  . metroNIDAZOLE  500 mg Oral 3 times per day  . modafinil  100 mg Oral Daily  . sodium bicarbonate  650 mg Oral TID  . sodium chloride  3 mL Intravenous Q12H  . vancomycin  1,000 mg Intravenous Once    Assessment/Plan:   1. Fever - unclear etiology at this time. I will get blood cultures 2 removed central line and culture tip and give 1 dose of IV vancomycin and reassess.  2. Hypoglycemia, patient was admitted with diabetic ketoacidosis. Patient placed on her insulin pump again and had an another episode of hypoglycemia this morning. 3. Hyperkalemia- 1 dose of Kayexalate given repeat potassium today. May have to give a Kayexalate enema. 4. Nausea and vomiting- Zofran ordered. X-ray of the abdomen is negative. 5. Septic shock and C. difficile colitis- by mouth Flagyl 6. Pneumothorax- likely from recent rib injury.  Chest tube 2 days ago. 7. Acute renal failure- resolved 8. Hyponatremia- continue normal saline. 9. Hypothyroidism continue levothyroxine 10. Severe protein calorie malnutrition- still watching patient's intake 11. Hypothermia- resolved 12. Hypotension- her blood pressures normally on the lower side.  Code Status:     Code Status Orders        Start     Ordered   02/01/15 1535  Do not attempt resuscitation (DNR)   Continuous    Question Answer Comment  In the event of cardiac or respiratory ARREST Do not call a "code blue"   In the event of cardiac or respiratory ARREST Do not perform Intubation, CPR, defibrillation or ACLS   In the event of cardiac or respiratory ARREST Use medication by any route, position, wound care, and other  measures to relive pain and suffering. May use oxygen, suction and manual treatment of airway obstruction as needed for comfort.      02/01/15 1536     Disposition Plan: To be determined  Consultants:  Surgery  Endocrine  Time spent: 24 minutes  Alford Highland  [facility] Hospitalists

## 2015-02-14 NOTE — Progress Notes (Signed)
Pt noted with odd behavior and breathing , CBG taken and noted blood glucose reported at 21. Call out to prime doc covering. Orders to give 1 amp of D50 and f/u with check of blood sugar. 1 amp of D50 given at 4:30am, recheck of blood glucose resulted at 118.

## 2015-02-14 NOTE — Progress Notes (Signed)
Nutrition Follow-up  DOCUMENTATION CODES:   Severe malnutrition in context of acute illness/injury  INTERVENTION:  Meals and snacks: Cater to pt preferences. Dinner order taken only wants grilled chicken, jello and supplement drink. Discussed options with pt Medical Nutrition Supplement: continue carnation instant breakfast at this time  NUTRITION DIAGNOSIS:   Inadequate oral intake related to acute illness as evidenced by NPO status, other (see comment) (thrush in mouth) being addressed with po intake and supplements    GOAL:   Patient will meet greater than or equal to 90% of their needs  Not meeting nutritional needs  MONITOR:    (Energy intake, Glucose profile, Electrolyte and renal profile)  REASON FOR ASSESSMENT:   Malnutrition Screening Tool    ASSESSMENT:      Pt's insulin pump removed today by MD.    Current Nutrition: Ate few bites of grits and toast this am and drank OJ, confirmed with RN Clydie Braun.  Also drank Carnation instant breakfast.  Nothing eaten off lunch tray, tray observed   Gastrointestinal Profile:vomited times 2 this am, xray negative per MD note Last BM: 9/15, c-diff negative   Medications: kaxyelate  Electrolyte/Renal Profile and Glucose Profile:   Recent Labs Lab 02/12/15 0540 02/13/15 0501 02/14/15 0540  NA 132* 128* 125*  K 5.0 5.3* 5.8*  CL 100* 97* 95*  CO2 BUN CREATININE 0.92 1.02* 1.22*  CALCIUM 7.4* 7.4* 7.2*  MG 1.8 1.3* 1.8  PHOS 2.7 3.0 3.5  GLUCOSE 77 41* 104*   Protein Profile:  Recent Labs Lab 02/08/15 0459  ALBUMIN 1.7*      Weight Trend since Admission: Filed Weights   02/12/15 0619 02/13/15 0500 02/14/15 0529  Weight: 142 lb 6.7 oz (64.6 kg) 140 lb 4.8 oz (63.64 kg) 136 lb 9.6 oz (61.961 kg)      Diet Order:  Diet Carb Modified Fluid consistency:: Thin; Room service appropriate?: Yes with Assist  Skin:   reviewed    Height:   Ht Readings from Last 1 Encounters:  02/01/2015   (1.549 m)    Weight:   Wt Readings from Last 1 Encounters:  02/14/15 136 lb 9.6 oz (61.961 kg)     BMI:  Body mass index is 25.82 kg/(m^2).  Estimated Nutritional Needs:   Kcal:  BEE 1012 kcals (IF 1.0-1.2, AF 1.3) 1447-1710 kcals/d.   Protein:  (1.0-1.2 g/kg) 54-65 g/d  Fluid:  (30-29ml/kg) 1620-1886ml/d  EDUCATION NEEDS:   No education needs identified at this time  MODERATE Care Level  Joli B. Freida Busman, RD, LDN [phone] (pager)

## 2015-02-14 NOTE — Progress Notes (Addendum)
Evaluated patient again with family at bedside. She had another hypoglycemic episode with FSBS 55 around 12PM. This was treated with 1 amp dextrose + carnation drink by mouth. She then had some N/V. I reviewed her pump and confirmed she has not bolused insulin since 02/07/15.   I have suspended the pump then removed it and gave it to her daughter [name]. We are all in agreement that given her erratic sugars and lack of confidence in her use of the pump, that we will revert back to multiple daily injections of insulin. Discussed with family the need for long-term plans on managing her diabetes. Patient will go from hospital to [facility] for rehab, when clinically ready. Afterwards will likely return home to be assisted by her husband or to her daughter Jerry Caras house to be assisted by [name]. They will decide. I can follow up as out-patient within one week of discharge.  Will resume: Lantus 6 units qhs NovoLog 1 unit per 50 mg/dl that BG is over 409 only. Check sugars q4 hrs for now, likely to transition to qACHS at discharge.  I will not be available over the upcoming weekend but can resume follow up on Monday 9/19 if she remains hospitalized.  Time spent with patient today >60 min and >50% coordinating care.

## 2015-02-14 NOTE — Progress Notes (Signed)
Inpatient Diabetes Program Recommendations  AACE/ADA: New Consensus Statement on Inpatient Glycemic Control (2015)  Target Ranges:  Prepandial:   less than 140 mg/dL      Peak postprandial:   less than 180 mg/dL (1-2 hours)      Critically ill patients:  140 - 180 mg/dL  Spoke to [employer], insulin pump has been turned off.   Susette Racer, RN, BA, MHA, CDE Diabetes Coordinator Inpatient Diabetes Program  [phone] (Team Pager) [phone] [facility] Office) 02/14/2015 7:21 AM

## 2015-02-14 NOTE — Progress Notes (Signed)
Notified MD of low blood pressure, orders taken.

## 2015-02-14 NOTE — Progress Notes (Signed)
Inpatient Diabetes Program Recommendations  AACE/ADA: New Consensus Statement on Inpatient Glycemic Control (2015)  Target Ranges:  Prepandial:   less than 140 mg/dL      Peak postprandial:   less than 180 mg/dL (1-2 hours)      Critically ill patients:  140 - 180 mg/dL   Hypoglycemia early this am.  Met with patient at the bedside to assess pump and site.  I asked [name] to show me her pump- the pump was securely attached and running.  Patient was unable to turn the pump off as directed (to assess competency) and fell asleep while attempting to show me.   Blood sugar was being checked while I was with the patient and was above 138m/dl.  I do not think [name] capable of managing her insulin pump at this time.   In with patient again after Dr. SJoycie Peekvisit.  Noted changes in basal rates as written by Dr. SGabriel Carina  Noted patient has not bolused since September 9th at 4:50pm.  Patient drank orange juice from breakfast tray and 1/2 container of applesauce with meds and did not bolus.   JGentry Fitz RN, BA, MHA, CDE Diabetes Coordinator Inpatient Diabetes Program  [phone](Team Pager) [phone]([place] 02/14/2015 10:04 AM

## 2015-02-14 NOTE — Progress Notes (Signed)
Inpatient Diabetes Program Recommendations  AACE/ADA: New Consensus Statement on Inpatient Glycemic Control (2015)  Target Ranges:  Prepandial:   less than 140 mg/dL      Peak postprandial:   less than 180 mg/dL (1-2 hours)      Critically ill patients:  140 - 180 mg/dL   Review of Glycemic Control  RN notified me of CBG of /dl.  Treated by RN per hypoglycemia protocol.  Dr. Tedd Sias notified- I have left message with Barbara's voicemail @ 12:09pm.    I went to see the patient- she has not bolused any insulin.   Daughter was visiting. Able to get patient to take in nearly all her carnation drink  (approximately 25gm carb)  Susette Racer, RN, BA, [place], CDE Diabetes Coordinator Inpatient Diabetes Program  [phone] (Team Pager) [phone] [facility] Office) 02/14/2015 12:45 PM

## 2015-02-14 NOTE — Progress Notes (Signed)
[name] was restarted yesterday on her insulin pump. Current pump settings: Basal rates: 0000 0.30 units/hr 0800 0.35 u/hr 2200 0.25 units/hr  Bolus settings Insulin:carb ratio 1:21 Sensitivity: 90 BG target: 140 Insulin on board: 5 hours  Sugar decreased to 21 overnight. Nursing was concerned that she was confused. At this time, she reports stable appetite. Tends to eat about 50% of her meals. I interrogated the pump and noted that she has not bolused (delivered add'l insulin) with the pump since prior to admission, on 02/07/15.   Medical history Past Medical History  Diagnosis Date  . Diabetes mellitus type 1   . Generalized headaches   . Hypothyroidism   . Convulsions/seizures     unknown type, nml EEG  . History of chicken pox   . Anemia   . Vitamin B12 deficiency 03/2012    normal IF     Surgical history Past Surgical History  Procedure Laterality Date  . Cesarean section  1983     Medications . antiseptic oral rinse  7 mL Mouth Rinse BID  . calcium carbonate  200 mg of elemental calcium Oral BID  . clotrimazole  10 mg Oral 5 X Daily  . enoxaparin (LOVENOX) injection  40 mg Subcutaneous Q24H  . insulin pump   Subcutaneous TID AC, HS, 0200  . levothyroxine  125 mcg Oral QAC breakfast  . metroNIDAZOLE  500 mg Oral 3 times per day  . modafinil  100 mg Oral Daily  . sodium bicarbonate  650 mg Oral TID  . sodium chloride  3 mL Intravenous Q12H  . sodium polystyrene  30 g Oral Once    Review of systems CV: no chest pain or palpitations ABD: no abdominal pain or nausea or vomiting   Physical Exam BP 115/29 mmHg  Pulse 98  Temp(Src) 98.6 F (37 C) (Oral)  Resp 16  Ht  (1.549 m)  Wt 61.961 kg (136 lb 9.6 oz)  BMI 25.82 kg/m2  SpO2 100%  GEN: well developed, well nourished female, in NAD. HEENT: No proptosis, EOMI, lid lag or stare. Oropharynx is clear.  NECK: supple, trachea midline.   RESPIRATORY: clear bilaterally, no wheeze, good inspiratory  effort.   CV: No carotid bruits, RRR. MUSCULOSKELETAL: no clubbing, no tremor ABD: soft, NT/ND, no organomegaly. Insulin pump in place, rt lower abdomen. EXT: no peripheral edema SKIN: no dermatopathy or rash or acanthosis nigricans.  LYMPH: no submandibular or supraclavicular LAD PSYC: alert and oriented, fair insight.  Labs Results for orders placed or performed during the hospital encounter of 02/10/2015 (from the past 24 hour(s))  Glucose, capillary     Status: Abnormal   Collection Time: 02/13/15 11:29 AM  Result Value Ref Range   Glucose-Capillary 141 (H) 65 - 99 mg/dL  Glucose, capillary     Status: Abnormal   Collection Time: 02/13/15  5:19 PM  Result Value Ref Range   Glucose-Capillary 142 (H) 65 - 99 mg/dL  Glucose, capillary     Status: None   Collection Time: 02/13/15  9:07 PM  Result Value Ref Range   Glucose-Capillary 79 65 - 99 mg/dL  Glucose, capillary     Status: Abnormal   Collection Time: 02/14/15  4:15 AM  Result Value Ref Range   Glucose-Capillary 21 (LL) 65 - 99 mg/dL  Glucose, capillary     Status: Abnormal   Collection Time: 02/14/15  5:17 AM  Result Value Ref Range   Glucose-Capillary 118 (H) 65 - 99 mg/dL  Magnesium  Status: None   Collection Time: 02/14/15  5:40 AM  Result Value Ref Range   Magnesium 1.8 1.7 - 2.4 mg/dL  Basic metabolic panel     Status: Abnormal   Collection Time: 02/14/15  5:40 AM  Result Value Ref Range   Sodium 125 (L) 135 - 145 mmol/L   Potassium 5.8 (H) 3.5 - 5.1 mmol/L   Chloride 95 (L) 101 - 111 mmol/L   CO2 26 22 - 32 mmol/L   Glucose, Bld 104 (H) 65 - 99 mg/dL   BUN 9 6 - 20 mg/dL   Creatinine, Ser 1.61 (H) 0.44 - 1.00 mg/dL   Calcium 7.2 (L) 8.9 - 10.3 mg/dL   GFR calc non Af Amer 44 (L) >60 mL/min   GFR calc Af Amer 52 (L) >60 mL/min   Anion gap 4 (L) 5 - 15  Phosphorus     Status: None   Collection Time: 02/14/15  5:40 AM  Result Value Ref Range   Phosphorus 3.5 2.5 - 4.6 mg/dL  Glucose, capillary      Status: Abnormal   Collection Time: 02/14/15  7:38 AM  Result Value Ref Range   Glucose-Capillary 106 (H) 65 - 99 mg/dL   Comment 1 Notify RN      Assessment Type 1 diabetes with hypoglycemia Use of insulin pump  Plan -Assessed her understanding and use of pump. She was unable to count carbs on her meal tray. Despite being shown the documentation from dietary indicating carbs in certain meal items, she was still unable to recalls and repeat the carb content for those meal items. -At this time, I intend to speak with her family to determine best option for managing her diabetes. If she cannot manage the pump, then we will need family to administer her insulin long-term. Will call her daughter [name] about this. -Left her pump in place for now. Adjusted her 12AM basal down to 0.1 units/hr and adjusted her 10 PM basal down to 0.175 units/hr (24-hr total basal = 6.05 units). -Spoke with nursing. Will obtain FSBS q3 hrs. -Continue current diet orders.  Will update team once I have spoken with family.

## 2015-02-14 NOTE — Progress Notes (Signed)
PT Cancellation Note  Patient Details Name: [name] MRN: [id number] DOB: 29-Jan-1946   Cancelled Treatment:    Reason Eval/Treat Not Completed: Medical issues which prohibited therapy (Treatment session attempted.  Per discussion with primary RN, recommends hold on patient at this time due to medical status (erratic blood sugars, increased K+).  Will hold at this time and re-attempt at later time/date as medically appropriate.)   Nylen Creque H. Manson Passey, PT, DPT, NCS 02/14/2015, 2:05 PM [phone]

## 2015-02-15 ENCOUNTER — Inpatient Hospital Stay: Payer: Medicare Other

## 2015-02-15 LAB — GLUCOSE, CAPILLARY
GLUCOSE-CAPILLARY: 62 mg/dL — AB (ref 65–99)
GLUCOSE-CAPILLARY: 94 mg/dL (ref 65–99)
Glucose-Capillary: 80 mg/dL (ref 65–99)
Glucose-Capillary: 55 mg/dL — ABNORMAL LOW (ref 65–99)
Glucose-Capillary: 108 mg/dL — ABNORMAL HIGH (ref 65–99)

## 2015-02-15 LAB — CBC
HEMATOCRIT: 25 % — AB (ref 35.0–47.0)
HEMOGLOBIN: 8.4 g/dL — AB (ref 12.0–16.0)
MCH: 29.2 pg (ref 26.0–34.0)
MCHC: 33.6 g/dL (ref 32.0–36.0)
MCV: 86.9 fL (ref 80.0–100.0)
Platelets: 182 10*3/uL (ref 150–440)
RBC: 2.88 MIL/uL — AB (ref 3.80–5.20)
RDW: 17 % — ABNORMAL HIGH (ref 11.5–14.5)
WBC: 12.2 10*3/uL — AB (ref 3.6–11.0)

## 2015-02-15 LAB — BASIC METABOLIC PANEL
Anion gap: 4 — ABNORMAL LOW (ref 5–15)
BUN: 10 mg/dL (ref 6–20)
CHLORIDE: 99 mmol/L — AB (ref 101–111)
CO2: 24 mmol/L (ref 22–32)
Calcium: 7 mg/dL — ABNORMAL LOW (ref 8.9–10.3)
Creatinine, Ser: 1.32 mg/dL — ABNORMAL HIGH (ref 0.44–1.00)
GFR calc non Af Amer: 40 mL/min — ABNORMAL LOW (ref >60)
GFR, EST AFRICAN AMERICAN: 47 mL/min — AB (ref >60)
Glucose, Bld: 97 mg/dL (ref 65–99)
POTASSIUM: 4.8 mmol/L (ref 3.5–5.1)
SODIUM: 127 mmol/L — AB (ref 135–145)

## 2015-02-15 MED ORDER — VANCOMYCIN HCL IN DEXTROSE 1-5 GM/200ML-% IV SOLN: 1000.0000 mg | Freq: Two times a day (BID) | INTRAVENOUS | Status: DC

## 2015-02-15 MED ORDER — VANCOMYCIN HCL IN DEXTROSE 1-5 GM/200ML-% IV SOLN
1000.0000 mg | Freq: Once | INTRAVENOUS | Status: AC
  Administered 2015-02-15: 1000 mg via INTRAVENOUS
  Filled 2015-02-15: qty 200

## 2015-02-15 MED ORDER — VANCOMYCIN HCL IN DEXTROSE 750-5 MG/150ML-% IV SOLN
750.0000 mg | INTRAVENOUS | Status: DC
  Filled 2015-02-15: qty 150

## 2015-02-15 MED ORDER — LEVOFLOXACIN IN D5W 750 MG/150ML IV SOLN
750.0000 mg | INTRAVENOUS | Status: DC
  Administered 2015-02-15: 750 mg via INTRAVENOUS
  Filled 2015-02-15: qty 150

## 2015-02-17 LAB — CATH TIP CULTURE: Culture: NO GROWTH

## 2015-02-19 LAB — CULTURE, BLOOD (ROUTINE X 2)
CULTURE: NO GROWTH
Culture: NO GROWTH

## 2015-03-01 NOTE — Progress Notes (Signed)
Levofloxacin 750 mg IV q24 hours was ordered. Based on patient's renal function (estimated CrCl ~40 mL/min), I changed dose to 750 mg IV q48hrs. Pharmacy will continue to monitor renal function and adjust dose as needed.

## 2015-03-01 NOTE — Progress Notes (Signed)
Pharmacy Consult for Electrolyte Management   No Known Allergies  Patient Measurements: Height: 5\' 1"  (154.9 cm) Weight: 137 lb 11.2 oz (62.46 kg) IBW/kg (Calculated) : 47.8  Vital Signs: Temp: 100 F (37.8 C) (09/17 0625) Temp Source: Oral (09/17 0625) BP: 95/77 mmHg (09/17 0625) Pulse Rate: 107 (09/17 0625) Intake/Output from previous day: 09/16 0701 - 09/17 0700 In: 1332 [I.V.:332; IV Piggyback:1000] Out: -  Intake/Output from this shift:    Labs:  Recent Labs  02/11/2015 0619  WBC 12.2*  HGB 8.4*  HCT 25.0*  PLT 182    Recent Labs  02/13/15 0501 02/14/15 0540 02/14/15 1447 02/19/2015 0619  NA 128* 125* 123* 127*  K 5.3* 5.8* 5.4* 4.8  CL 97* 95* 93* 99*  CO2 29 26 24 24   GLUCOSE 41* 104* 114* 97  BUN 9 9 10 10   CREATININE 1.02* 1.22* 1.25* 1.32*  CALCIUM 7.4* 7.2* 7.0* 7.0*  MG 1.3* 1.8  --   --   PHOS 3.0 3.5  --   --    Estimated Creatinine Clearance: 34.6 mL/min (by C-G formula based on Cr of 1.32).    Recent Labs  02/14/15 1627 02/14/15 2211 01/31/2015 0749  GLUCAP 114* 171* 80    Medical History: Past Medical History  Diagnosis Date  . Diabetes mellitus type 1   . Generalized headaches   . Hypothyroidism   . Convulsions/seizures     unknown type, nml EEG  . History of chicken pox   . Anemia   . Vitamin B12 deficiency 03/2012    normal IF    Medications:  Scheduled:  . antiseptic oral rinse  7 mL Mouth Rinse BID  . calcium carbonate  200 mg of elemental calcium Oral BID  . clotrimazole  10 mg Oral 5 X Daily  . dextrose  50 mL Intravenous Once  . enoxaparin (LOVENOX) injection  40 mg Subcutaneous Q24H  . insulin aspart  1-3 Units Subcutaneous QHS  . insulin aspart  1-5 Units Subcutaneous TID WC  . insulin glargine  6 Units Subcutaneous QHS  . levothyroxine  125 mcg Oral QAC breakfast  . metroNIDAZOLE  500 mg Oral 3 times per day  . modafinil  100 mg Oral Daily  . sodium bicarbonate  650 mg Oral TID  . sodium chloride  3 mL  Intravenous Q12H   Infusions:  . sodium chloride 50 mL/hr at 02/14/15 1451   PRN: acetaminophen **OR** [DISCONTINUED] acetaminophen, ALPRAZolam, calcium carbonate, ipratropium-albuterol, morphine injection, ondansetron **OR** ondansetron (ZOFRAN) IV, polyvinyl alcohol  Assessment: Phamracy consulted to assist in managing electrolytes in this 69 y/o F with DKA.   Plan:  Phosphorus and magnesium are low so will replace with magnesium sulfate 2 g iv once and sodium phosphate 10 mmol iv once. Will recheck electrolytes with AM labs.  Pharmacy to follow per consult.   9/7 Potassium 3.4 and Calcium 8.2 corrected. Will order KCl 20 mEq IV and 1 gram calcium gluconate IV. F/u labs in AM.  9/8 Potassium 3.4, Mg 1.6, and Ca 7.7. KCl 20 mEq, calcium gluconate 1 gram and magnesium sulfate 2 grams ordered. F/u labs in AM.  9/9 Potassium 3.3 and calcium 7.4. KCl 40 mEq and calcium gluconate 2 grams x1 ordered. F/u labs in AM.  9/10 Electrolytes WNL except Mg 1.6. Corrected calcium 9.5 for albumin of 1.7. Magnesium 2 grams ordered. Recheck in AM.  9/11 Electrolytes WNL, including corrected calcium.. Recheck labs in AM.  9/12 Electrolytes WNL except Mg 1.4.  2 grams magnesium sulfate x1 ordered. Recheck in AM.  0913 AM Na 134, K 4.9, Ca 7.4 but adjusted 9.2, phos 2.6, Mg 1.5. Ordered magnesium 4 gm IV x 1, will recheck in AM.  0914 AM electrolytes within normal limits will recheck in AM.  0915 AM Na 128, K 5.3, Ca 7.4, Phos 3, Mg 1.3. Give magnesium 4 gm IV x 1 now recheck in the morning.    0917 AM K 4.8. Will recheck electrolytes in am.   Bari Mantis PharmD Clinical Pharmacist 02/02/2015 8:46 AM

## 2015-03-01 NOTE — Progress Notes (Signed)
Pt VSS throughout day. Pt had no complaints of pain nor nausea. Eating small amounts of each meal. FSBS checked throughout day. No insulin given. Pt seemed tired throughout day. Pt husband was visiting this AM. Two of the pt's daughters visiting this evening. One assisted with the feeding the pt. Pt family expressed concern about pt not getting any better and was wanting to talk to Dr. about transfer to a different hospital. . Dr Nemiah Commander was notified at 1810 and spoken to in regard to this and orders given to inform family that this could be started in the morning and it would be considered a pt family request transfer. Primary nurse informed daughter  of this. Pt daughter approached the desk at 1845 stating that her mom was not "looking right". Primary nurse entered the room and noted that pt breathing was shallow. Rapid Response was called. Dr. Nemiah Commander paged and spoken to. Orders for stat ABG was obtained. Pt FSBS was checked and vitals obtained. The pt became unresponsive and pulseless. Code Blue was  called and canceled due to the pt being a DNR. Dr. Nemiah Commander pronounced pt dead  at 1856. Daughter at bedside. Chaplain at bedside.  Supervisor Notified. [place] [facility] notified with all questions answered.

## 2015-03-01 NOTE — Progress Notes (Signed)
Patient ID: [name], female   DOB: [birth year], 69 y.o.   MRN: [id number] [facility] PROGRESS NOTE  PCP: Lauro Regulus., MD  HPI/Subjective: Patient states that she feels okay today. No further nausea or vomiting. Ate a little breakfast this morning. She feels better than yesterday. No complaints of shortness of breath or cough.  Objective: Filed Vitals:   02/07/2015 0625  BP: 95/77  Pulse: 107  Temp: 100 F (37.8 C)  Resp: 16    Filed Weights   02/13/15 0500 02/14/15 0529 02/26/2015 0500  Weight: 63.64 kg (140 lb 4.8 oz) 61.961 kg (136 lb 9.6 oz) 62.46 kg (137 lb 11.2 oz)    ROS: Review of Systems  Constitutional: Positive for fever. Negative for chills.  Eyes: Negative for blurred vision.  Respiratory: Negative for cough and shortness of breath.   Cardiovascular: Negative for chest pain.  Gastrointestinal: Negative for nausea, vomiting, abdominal pain, diarrhea and constipation.  Genitourinary: Negative for dysuria.  Musculoskeletal: Negative for joint pain.  Neurological: Negative for dizziness and headaches.   Exam: Physical Exam  Constitutional: She is oriented to person, place, and time.  HENT:  Nose: No mucosal edema.  Mouth/Throat: No oropharyngeal exudate or posterior oropharyngeal edema.  Eyes: Conjunctivae, EOM and lids are normal. Pupils are equal, round, and reactive to light.  Neck: No JVD present. Carotid bruit is not present. No edema present. No thyroid mass and no thyromegaly present.  Cardiovascular: S1 normal and S2 normal.  Exam reveals no gallop.   Murmur heard.  Systolic murmur is present with a grade of 2/6  Pulses:      Dorsalis pedis pulses are 2+ on the right side, and 2+ on the left side.  Respiratory: No respiratory distress. She has no wheezes. She has rhonchi in the right lower field and the left lower field. She has no rales.  GI: Soft. Bowel sounds are normal. There is no tenderness.  Musculoskeletal:   Right ankle: She exhibits no swelling.       Left ankle: She exhibits no swelling.  Lymphadenopathy:    She has no cervical adenopathy.  Neurological: She is alert and oriented to person, place, and time. No cranial nerve deficit.  Skin: Skin is warm. No rash noted. Nails show no clubbing.  Psychiatric: She has a normal mood and affect.    Data Reviewed: Basic Metabolic Panel:  Recent Labs Lab 02/10/15 0521 02/11/15 0429 02/12/15 0540 02/13/15 0501 02/14/15 0540 02/14/15 1447 02/21/2015 0619  NA 137 134* 132* 128* 125* 123* 127*  K 4.5 4.9 5.0 5.3* 5.8* 5.4* 4.8  CL 108 104 100* 97* 95* 93* 99*  CO2 25 26 29 29 26 24 24   GLUCOSE 126* 244* 77 41* 104* 114* 97  BUN 10 9 9 9 9 10 10   CREATININE 0.76 0.77 0.92 1.02* 1.22* 1.25* 1.32*  CALCIUM 7.5* 7.4* 7.4* 7.4* 7.2* 7.0* 7.0*  MG 1.4* 1.5* 1.8 1.3* 1.8  --   --   PHOS 3.5 2.6 2.7 3.0 3.5  --   --    CBC:  Recent Labs Lab 02/14/2015 0619  WBC 12.2*  HGB 8.4*  HCT 25.0*  MCV 86.9  PLT 182    CBG:  Recent Labs Lab 02/14/15 1123 02/14/15 1258 02/14/15 1627 02/14/15 2211 02/27/2015 0749  GLUCAP 55* 132* 114* 171* 80       Studies: Dg Chest 2 View  02/14/2015   CLINICAL DATA:  Left-sided pneumothorax.  EXAM: CHEST  2  VIEW  COMPARISON:  02/13/2015  FINDINGS: There are bilateral small pleural effusions. There is mild right infrahilar airspace disease. There is left lower lobe airspace disease likely reflecting atelectasis versus pneumonia. There is no pneumothorax. The heart and mediastinal contours are stable.  The osseous structures are unremarkable.  IMPRESSION: 1. Interval resolution of left apical pneumothorax. 2. Small bilateral pleural effusions, left greater than right. Left lower lobe airspace disease likely reflecting atelectasis versus pneumonia. 3. Mild right infrahilar airspace disease.   Electronically Signed   By: Elige Ko   On: 02/08/2015 09:33   Dg Abd Portable 2v  02/14/2015   CLINICAL DATA:   Vomiting, diabetes, hypothyroidism  EXAM: PORTABLE ABDOMEN - 2 VIEW  COMPARISON:  02/13/2015 and earlier studies  FINDINGS: Central line to the SVC RA junction. Bilateral pleural effusions with adjacent atelectasis/ consolidation in the visualized lung bases. Low lung volumes with hazy opacities in visualized lung fields.  No free air. Paucity of small bowel gas. Colon decompressed. Regional bones unremarkable.  IMPRESSION: 1. Nonobstructive bowel gas pattern. 2. Bilateral pleural effusions.   Electronically Signed   By: Corlis Leak M.D.   On: 02/14/2015 13:36    Scheduled Meds: . antiseptic oral rinse  7 mL Mouth Rinse BID  . calcium carbonate  200 mg of elemental calcium Oral BID  . clotrimazole  10 mg Oral 5 X Daily  . dextrose  50 mL Intravenous Once  . enoxaparin (LOVENOX) injection  40 mg Subcutaneous Q24H  . insulin aspart  1-3 Units Subcutaneous QHS  . insulin aspart  1-5 Units Subcutaneous TID WC  . insulin glargine  6 Units Subcutaneous QHS  . levofloxacin (LEVAQUIN) IV  750 mg Intravenous Q24H  . levothyroxine  125 mcg Oral QAC breakfast  . metroNIDAZOLE  500 mg Oral 3 times per day  . modafinil  100 mg Oral Daily  . sodium bicarbonate  650 mg Oral TID  . sodium chloride  3 mL Intravenous Q12H  . vancomycin  1,000 mg Intravenous Q12H    Assessment/Plan:   1. Potential hospital-acquired pneumonia- chest x-ray today showing pneumonia. I will start high-dose Levaquin and vancomycin with pharmacy to dose. Watch temperature curve. With her history of C. difficile I was hesitant on starting antibiotics but will have to watch things closely. Central line removed yesterday so for blood cultures and catheter tip culture negative. 2. Hypoglycemia, patient was admitted with diabetic ketoacidosis. Patient placed on her insulin pump. Patient sugars trending better today. 3. Hyperkalemia- resolved with IV fluids 4. Nausea and vomiting- as per patient better today 5. Septic shock and C.  difficile colitis- by mouth Flagyl will need 14 more days of Flagyl after stopping antibiotics for pneumonia. 6. Pneumothorax- likely from recent rib injury. Chest tube removed. 7. Acute renal failure- resolved 8. Hyponatremia- continue normal saline. 9. Hypothyroidism continue levothyroxine 10. Severe protein calorie malnutrition- still watching patient's intake 11. Hypothermia- resolved 12. Hypotension- her blood pressures normally on the lower side.  Code Status:     Code Status Orders        Start     Ordered   02/01/15 1535  Do not attempt resuscitation (DNR)   Continuous    Question Answer Comment  In the event of cardiac or respiratory ARREST Do not call a "code blue"   In the event of cardiac or respiratory ARREST Do not perform Intubation, CPR, defibrillation or ACLS   In the event of cardiac or respiratory ARREST Use medication by  any route, position, wound care, and other measures to relive pain and suffering. May use oxygen, suction and manual treatment of airway obstruction as needed for comfort.      02/01/15 1536     Disposition Plan: To be determined  Consultants:  Surgery  Endocrine  Time spent: 23 minutes  Alford Highland  [facility]

## 2015-03-01 NOTE — Progress Notes (Signed)
ANTIBIOTIC CONSULT NOTE - INITIAL  Pharmacy Consult for vancomycin dosing and monitoring Indication: fever with central line  No Known Allergies  Patient Measurements: Height:  (154.9 cm) Weight: 137 lb 11.2 oz (62.46 kg) IBW/kg (Calculated) : 47.8  Vital Signs: Temp: 100 F (37.8 C) (09/17 0625) Temp Source: Oral (09/17 0625) BP: 95/77 mmHg (09/17 0625) Pulse Rate: 107 (09/17 0625) Intake/Output from previous day: 09/16 0701 - 09/17 0700 In: 1332 [I.V.:332; IV Piggyback:1000] Out: -  Intake/Output from this shift: Total I/O In: 833 [I.V.:833] Out: -   Labs:  Recent Labs  02/14/15 0540 02/14/15 1447 March 10, 2015 0619  WBC  --   --  12.2*  HGB  --   --  8.4*  PLT  --   --  182  CREATININE 1.22* 1.25* 1.32*   Estimated Creatinine Clearance: 34.6 mL/min (by C-G formula based on Cr of 1.32). No results for input(s): VANCOTROUGH, VANCOPEAK, VANCORANDOM, GENTTROUGH, GENTPEAK, GENTRANDOM, TOBRATROUGH, TOBRAPEAK, TOBRARND, AMIKACINPEAK, AMIKACINTROU, AMIKACIN in the last 72 hours.   Microbiology: Recent Results (from the past 720 hour(s))  MRSA PCR Screening     Status: None   Collection Time: 01/20/15  1:15 PM  Result Value Ref Range Status   MRSA by PCR NEGATIVE NEGATIVE Final    Comment:        The GeneXpert MRSA Assay (FDA approved for NASAL specimens only), is one component of a comprehensive MRSA colonization surveillance program. It is not intended to diagnose MRSA infection nor to guide or monitor treatment for MRSA infections.   Urine culture     Status: None   Collection Time: 01/22/15 12:55 PM  Result Value Ref Range Status   Specimen Description URINE, RANDOM  Final   Special Requests NONE  Final   Culture NO GROWTH 1 DAY  Final   Report Status 01/23/2015 FINAL  Final  Culture, blood (routine x 2)     Status: None   Collection Time: 02/22/2015  3:34 AM  Result Value Ref Range Status   Specimen Description BLOOD BLOOD RIGHT FOREARM  Final   Special Requests BOTTLES DRAWN AEROBIC AND ANAEROBIC  Final   Culture  Setup Time   Final    GRAM POSITIVE RODS IN BOTH AEROBIC AND ANAEROBIC BOTTLES CRITICAL RESULT CALLED TO, READ BACK BY AND VERIFIED WITH: BRITTANY RUDD ON 02/24/2015 AT 1752 BY TB CONFIRMED BY TB/MS.    Culture   Final    BACILLUS SPECIES IN BOTH AEROBIC AND ANAEROBIC BOTTLES Results consistent with contamination.    Report Status 02/03/2015 FINAL  Final  Culture, blood (routine x 2)     Status: None   Collection Time: 02/12/2015  3:34 AM  Result Value Ref Range Status   Specimen Description BLOOD RIGHT HAND  Final   Special Requests BOTTLES DRAWN AEROBIC AND ANAEROBIC  Final   Culture NO GROWTH 5 DAYS  Final   Report Status 02/04/2015 FINAL  Final  MRSA PCR Screening     Status: None   Collection Time: 02/25/2015  5:15 AM  Result Value Ref Range Status   MRSA by PCR NEGATIVE NEGATIVE Final    Comment:        The GeneXpert MRSA Assay (FDA approved for NASAL specimens only), is one component of a comprehensive MRSA colonization surveillance program. It is not intended to diagnose MRSA infection nor to guide or monitor treatment for MRSA infections.   C difficile quick scan w PCR reflex     Status: Abnormal  Collection Time: 02/02/15  4:12 PM  Result Value Ref Range Status   C Diff antigen POSITIVE (A) NEGATIVE Final   C Diff toxin POSITIVE (A) NEGATIVE Final   C Diff interpretation   Final    Positive for toxigenic C. difficile, active toxin production present.    Comment: CRITICAL RESULT CALLED TO, READ BACK BY AND VERIFIED WITH: CHRISTINA MILES ON 02/02/15 AT 1715 BY [facility]   Culture, blood (routine x 2)     Status: None   Collection Time: 02/08/15  9:48 AM  Result Value Ref Range Status   Specimen Description BLOOD LEFT WRIST  Final   Special Requests   Final    BOTTLES DRAWN AEROBIC AND ANAEROBIC  AER 2CC ANA 1CC   Culture NO GROWTH 5 DAYS  Final   Report Status 02/13/2015 FINAL  Final  Culture,  blood (routine x 2)     Status: None   Collection Time: 02/08/15  9:48 AM  Result Value Ref Range Status   Specimen Description BLOOD RIGHT WRIST  Final   Special Requests   Final    BOTTLES DRAWN AEROBIC AND ANAEROBIC  AER 3CC ANA 1CC   Culture NO GROWTH 5 DAYS  Final   Report Status 02/13/2015 FINAL  Final  Urine culture     Status: None   Collection Time: 02/08/15 11:06 AM  Result Value Ref Range Status   Specimen Description URINE, CATHETERIZED  Final   Special Requests Normal  Final   Culture >=100,000 COLONIES/mL CANDIDA ALBICANS  Final   Report Status 02/11/2015 FINAL  Final  Culture, blood (routine x 2)     Status: None (Preliminary result)   Collection Time: 02/14/15  2:47 PM  Result Value Ref Range Status   Specimen Description BLOOD RIGHT HAND  Final   Special Requests   Final    BOTTLES DRAWN AEROBIC AND ANAEROBIC 1CC AEROBIC,1CC ANAEROBIC   Culture NO GROWTH < 24 HOURS  Final   Report Status PENDING  Incomplete  Culture, blood (routine x 2)     Status: None (Preliminary result)   Collection Time: 02/14/15  3:19 PM  Result Value Ref Range Status   Specimen Description BLOOD RIGHT HAND  Final   Special Requests BOTTLES DRAWN AEROBIC ONLY 1CC  Final   Culture NO GROWTH < 24 HOURS  Final   Report Status PENDING  Incomplete  Cath Tip Culture     Status: None (Preliminary result)   Collection Time: 02/14/15  3:30 PM  Result Value Ref Range Status   Specimen Description CATH TIP  Final   Special Requests NONE  Final   Culture NO GROWTH < 24 HOURS  Final   Report Status PENDING  Incomplete    Medical History: Past Medical History  Diagnosis Date  . Diabetes mellitus type 1   . Generalized headaches   . Hypothyroidism   . Convulsions/seizures     unknown type, nml EEG  . History of chicken pox   . Anemia   . Vitamin B12 deficiency 03/2012    normal IF    Medications:  Anti-infectives    Start     Dose/Rate Route Frequency Ordered Stop   01/30/2015 2000   vancomycin (VANCOCIN) IVPB 750 mg/150 ml premix     750 mg 150 mL/hr over 60 Minutes Intravenous Every 48 hours 02/13/2015 1133     02/22/2015 1200  levofloxacin (LEVAQUIN) IVPB 750 mg     750 mg 100 mL/hr over 90 Minutes Intravenous  Every 48 hours 02/14/2015 1100     02/03/2015 1200  vancomycin (VANCOCIN) IVPB 1000 mg/200 mL premix     1,000 mg 200 mL/hr over 60 Minutes Intravenous  Once 01/31/2015 1116     02/26/2015 1115  vancomycin (VANCOCIN) IVPB 1000 mg/200 mL premix  Status:  Discontinued     1,000 mg 200 mL/hr over 60 Minutes Intravenous Every 12 hours 02/28/2015 1100 02/02/2015 1115   02/14/15 1400  vancomycin (VANCOCIN) IVPB 1000 mg/200 mL premix     1,000 mg 200 mL/hr over 60 Minutes Intravenous  Once 02/14/15 1353 02/14/15 1734   02/08/2015 1230  ceFAZolin (ANCEF) powder 1 g  Status:  Discontinued     1 g Other To Surgery 02/08/2015 1157 02/12/2015 1213   02/23/2015 1215  ceFAZolin (ANCEF) IVPB 1 g/50 mL premix     1 g 100 mL/hr over 30 Minutes Intravenous  Once 02/02/2015 1213 02/24/2015 1535   02/06/15 1400  metroNIDAZOLE (FLAGYL) tablet 500 mg     500 mg Oral 3 times per day 02/06/15 1051     02/02/15 1815  metroNIDAZOLE (FLAGYL) IVPB 500 mg  Status:  Discontinued     500 mg 100 mL/hr over 60 Minutes Intravenous Every 8 hours 02/02/15 1804 02/06/15 1051   01/31/15 1600  vancomycin (VANCOCIN) IVPB 750 mg/150 ml premix  Status:  Discontinued     750 mg 150 mL/hr over 60 Minutes Intravenous Every 36 hours Feb 12, 2015 0516 12-Feb-2015 1228   01/31/15 0400  vancomycin (VANCOCIN) IVPB 750 mg/150 ml premix  Status:  Discontinued     750 mg 150 mL/hr over 60 Minutes Intravenous Every 24 hours 02-12-15 1228 02/05/15 1148   02/12/2015 0600  piperacillin-tazobactam (ZOSYN) IVPB 3.375 g  Status:  Discontinued     3.375 g 12.5 mL/hr over 240 Minutes Intravenous 3 times per day 12-Feb-2015 0516 02/05/15 1148   Feb 12, 2015 0345  vancomycin (VANCOCIN) IVPB 1000 mg/200 mL premix     1,000 mg 200 mL/hr over 60 Minutes  Intravenous  Once 02-12-2015 0331 02/12/15 0506   2015-02-12 0345  piperacillin-tazobactam (ZOSYN) IVPB 3.375 g  Status:  Discontinued     3.375 g 12.5 mL/hr over 240 Minutes Intravenous  Once 02/12/2015 0331 02-12-2015 0745     Assessment: Pharmacy consulted to dose vancomycin on this 69 year old female who was having fever with a central line. Central line was removed on 02/14/15.   Goal of Therapy:  Vancomycin trough level 15-20 mcg/ml  Plan:  Using patients actual body weight of 62.5 kg, I calculated her estimated CrCl to be ~ 40 mL/min. With a calculated ke of 0.038 hr and half-life of ~18 hours, will give an initial dose of 1000 mg vancomycin. Will then give a stacked dose 8 hours later and begin 750 mg IV q48h. Trough ordered prior to 4th dose on 9/21.   Pharmacy will continue to monitor renal function and vancomycin levels and adjust dose as needed.   Thank you for the consult.   Jodelle Red Swayne 02/25/2015,11:34 AM

## 2015-03-01 NOTE — Progress Notes (Signed)
Patient was eating dinner with family this evening per RN. Then a few minutes later when he went to check on her, her breathing was shallow- rapid response called, by the time, the rapid response team has arrived- patient didt have a pulse, pupils fixed and dilated. She is already a DNR. Rhythm strip with asystole. Daughter at bedside and emotional.  Time of death: 6:56 PM

## 2015-03-01 NOTE — Progress Notes (Signed)
Rapid response called. Upon arrival to patient room, patient was lethargic, minimally responsive to voice. Vital signs obtained. Patient quickly declined, became unresponsive with no palpable pulse, daughter present reminded staff that patient was No Code/DNR, order confirmed in patient chart. MD arrived, was updated. Updated oncoming nursing supervisor as rapid response ended.

## 2015-03-01 NOTE — Discharge Summary (Signed)
[facility] [place] at [facility]   PATIENT NAME: [name]    MR#:  [id number]  DATE OF BIRTH:  Jun 20, 1945  DATE OF ADMISSION:  01/31/2015 ADMITTING PHYSICIAN: Oralia Manis, MD  DATE OF DEATH: 03-04-2015  7:00 PM  PRIMARY CARE PHYSICIAN: Lauro Regulus., MD    ADMISSION DIAGNOSIS:  Dehydration [E86.0] Malnutrition [E46] Metabolic acidosis [E87.2] Weakness [R53.1] Renal insufficiency [N28.9]  DISCHARGE DIAGNOSIS:  Principal Problem:   DKA (diabetic ketoacidoses) Active Problems:   Hypothyroidism   ARF (acute renal failure)   Dehydration   Type I diabetes mellitus   SIRS (systemic inflammatory response syndrome)   Broken ribs   Protein-calorie malnutrition, severe   Pneumothorax   Pressure ulcer   SECONDARY DIAGNOSIS:   Past Medical History  Diagnosis Date  . Diabetes mellitus type 1   . Generalized headaches   . Hypothyroidism   . Convulsions/seizures     unknown type, nml EEG  . History of chicken pox   . Anemia   . Vitamin B12 deficiency 03/2012    normal IF    HOSPITAL COURSE:   1. Cardiopulmonary arrest on March 04, 2015. Patient was a DO NOT RESUSCITATE and did not survive the arrest. As per notes the patient was eating dinner and then did not look good and family came out to notify the nursing staff. When Dr. Jake Church that he arrived at the bedside she was pronounced dead. 2. Diabetic ketoacidosis- patient initially admitted to the ICU and started on insulin drip. Was seen in consultation by endocrinology. The patient started having hypoglycemic episodes during the last couple days of the hospitalization. Lantus actually had to be held. Endocrine did put back on the low dose insulin pump. 3. Spontaneous pneumothorax on 02-26-2015. Patient had chest tube placed by Dr. Colette Ribas in the operating room. On 02/12/2015 the chest tube was taken out by Dr. Michela Pitcher surgery. Repeat chest x-ray on 9:15 showed no change and small left apical  pneumothorax. Chest x-ray on Mar 04, 2015 showed interval resolution of the left apical pneumothorax. Left lower airspace disease likely reflecting atelectasis versus pneumonia and mild right infrahilar airspace disease. 4. Septic shock. Pneumonia and then C. difficile colitis and then a recurrence of pneumonia. Patient finished broad-spectrum antibiotics started on admission for the initial pneumonia. Flagyl to treat C. difficile colitis was started on 02/02/2015. The patient spiked a fever on 02/14/2015. Blood cultures were drawn at that time are still negative to date. Her central line was removed and catheter tip was cultured and that also shows no growth. On 02/14/2015 give 1 dose of IV vancomycin to cover for infection. On 03-04-2015 chest x-ray showed possibility of pneumonia. Vancomycin and Zosyn were started for hospital-acquired pneumonia. An Flagyl was continued. 5. Acute renal failure and dehydration, hyponatremia the patient was hydrated throughout most of the hospital course. Creatinine was 1.99 on presentation and decreased to 0.77 on 02/11/2015. Then it started to worsen little bit up to 1.32 on 03-04-15. 6. Nausea and vomiting- on 02/13/2015 and 02/14/2015. 7. Hyperkalemia- on 9:15 and 02/14/2015 patient was treated with Kayexalate 8. Hyponatremia normal saline was continued 9. Hypothermia- with the patient septic shock 10. Severe protein calorie malnutrition- high mortality with severe protein calorie malnutrition. 11. Hypothyroidism- patient was continued on levothyroxine   DRUG ALLERGIES:  No Known Allergies  DATA REVIEW:   CBC  Recent Labs Lab 04-Mar-2015 0619  WBC 12.2*  HGB 8.4*  HCT 25.0*  PLT 182    Chemistries   Recent Labs  Lab 02/14/15 0540  02/16/2015 0619  NA 125*  < > 127*  K 5.8*  < > 4.8  CL 95*  < > 99*  CO2 26  < > 24  GLUCOSE 104*  < > 97  BUN 9  < > 10  CREATININE 1.22*  < > 1.32*  CALCIUM 7.2*  < > 7.0*  MG 1.8  --   --   < > = values in this  interval not displayed.   Microbiology Results  Results for orders placed or performed during the hospital encounter of 02/25/2015  Culture, blood (routine x 2)     Status: None   Collection Time: 01/31/2015  3:34 AM  Result Value Ref Range Status   Specimen Description BLOOD BLOOD RIGHT FOREARM  Final   Special Requests BOTTLES DRAWN AEROBIC AND ANAEROBIC  Final   Culture  Setup Time   Final    GRAM POSITIVE RODS IN BOTH AEROBIC AND ANAEROBIC BOTTLES CRITICAL RESULT CALLED TO, READ BACK BY AND VERIFIED WITH: BRITTANY RUDD ON 02/10/2015 AT 1752 BY TB CONFIRMED BY TB/MS.    Culture   Final    BACILLUS SPECIES IN BOTH AEROBIC AND ANAEROBIC BOTTLES Results consistent with contamination.    Report Status 02/03/2015 FINAL  Final  Culture, blood (routine x 2)     Status: None   Collection Time: 02/18/2015  3:34 AM  Result Value Ref Range Status   Specimen Description BLOOD RIGHT HAND  Final   Special Requests BOTTLES DRAWN AEROBIC AND ANAEROBIC  Final   Culture NO GROWTH 5 DAYS  Final   Report Status 02/04/2015 FINAL  Final  MRSA PCR Screening     Status: None   Collection Time: 02/08/2015  5:15 AM  Result Value Ref Range Status   MRSA by PCR NEGATIVE NEGATIVE Final    Comment:        The GeneXpert MRSA Assay (FDA approved for NASAL specimens only), is one component of a comprehensive MRSA colonization surveillance program. It is not intended to diagnose MRSA infection nor to guide or monitor treatment for MRSA infections.   C difficile quick scan w PCR reflex     Status: Abnormal   Collection Time: 02/02/15  4:12 PM  Result Value Ref Range Status   C Diff antigen POSITIVE (A) NEGATIVE Final   C Diff toxin POSITIVE (A) NEGATIVE Final   C Diff interpretation   Final    Positive for toxigenic C. difficile, active toxin production present.    Comment: CRITICAL RESULT CALLED TO, READ BACK BY AND VERIFIED WITH: CHRISTINA MILES ON 02/02/15 AT 1715 BY [facility]   Culture, blood (routine  x 2)     Status: None   Collection Time: 02/08/15  9:48 AM  Result Value Ref Range Status   Specimen Description BLOOD LEFT WRIST  Final   Special Requests   Final    BOTTLES DRAWN AEROBIC AND ANAEROBIC  AER 2CC ANA 1CC   Culture NO GROWTH 5 DAYS  Final   Report Status 02/13/2015 FINAL  Final  Culture, blood (routine x 2)     Status: None   Collection Time: 02/08/15  9:48 AM  Result Value Ref Range Status   Specimen Description BLOOD RIGHT WRIST  Final   Special Requests   Final    BOTTLES DRAWN AEROBIC AND ANAEROBIC  AER 3CC ANA 1CC   Culture NO GROWTH 5 DAYS  Final   Report Status 02/13/2015 FINAL  Final  Urine culture     Status: None   Collection Time: 02/08/15 11:06 AM  Result Value Ref Range Status   Specimen Description URINE, CATHETERIZED  Final   Special Requests Normal  Final   Culture >=100,000 COLONIES/mL CANDIDA ALBICANS  Final   Report Status 02/11/2015 FINAL  Final  Culture, blood (routine x 2)     Status: None (Preliminary result)   Collection Time: 02/14/15  2:47 PM  Result Value Ref Range Status   Specimen Description BLOOD RIGHT HAND  Final   Special Requests   Final    BOTTLES DRAWN AEROBIC AND ANAEROBIC 1CC AEROBIC,1CC ANAEROBIC   Culture NO GROWTH 2 DAYS  Final   Report Status PENDING  Incomplete  Culture, blood (routine x 2)     Status: None (Preliminary result)   Collection Time: 02/14/15  3:19 PM  Result Value Ref Range Status   Specimen Description BLOOD RIGHT HAND  Final   Special Requests BOTTLES DRAWN AEROBIC ONLY 1CC  Final   Culture NO GROWTH 2 DAYS  Final   Report Status PENDING  Incomplete  Cath Tip Culture     Status: None (Preliminary result)   Collection Time: 02/14/15  3:30 PM  Result Value Ref Range Status   Specimen Description CATH TIP  Final   Special Requests NONE  Final   Culture NO GROWTH 2 DAYS  Final   Report Status PENDING  Incomplete    RADIOLOGY:  Dg Chest 2 View  02/14/2015   CLINICAL DATA:  Left-sided pneumothorax.   EXAM: CHEST  2 VIEW  COMPARISON:  02/13/2015  FINDINGS: There are bilateral small pleural effusions. There is mild right infrahilar airspace disease. There is left lower lobe airspace disease likely reflecting atelectasis versus pneumonia. There is no pneumothorax. The heart and mediastinal contours are stable.  The osseous structures are unremarkable.  IMPRESSION: 1. Interval resolution of left apical pneumothorax. 2. Small bilateral pleural effusions, left greater than right. Left lower lobe airspace disease likely reflecting atelectasis versus pneumonia. 3. Mild right infrahilar airspace disease.   Electronically Signed   By: Elige Ko   On: 02/01/2015 09:33     Duwaine Maxin.D on 02/16/2015 at 2:00 PM  [facility]  Office  [phone]  CC: Primary care physician; Lauro Regulus., MD

## 2015-03-01 DEATH — deceased

## 2017-01-11 IMAGING — CR DG RIBS W/ CHEST 3+V*L*
3 series · 4 of 4 positions shown · non-contrast
Comparison: May 15, 2013

CLINICAL DATA: Status post fall today complaining of left posterior
rib pain.

EXAM:
LEFT RIBS AND CHEST - 3+ VIEW

[chest pa]
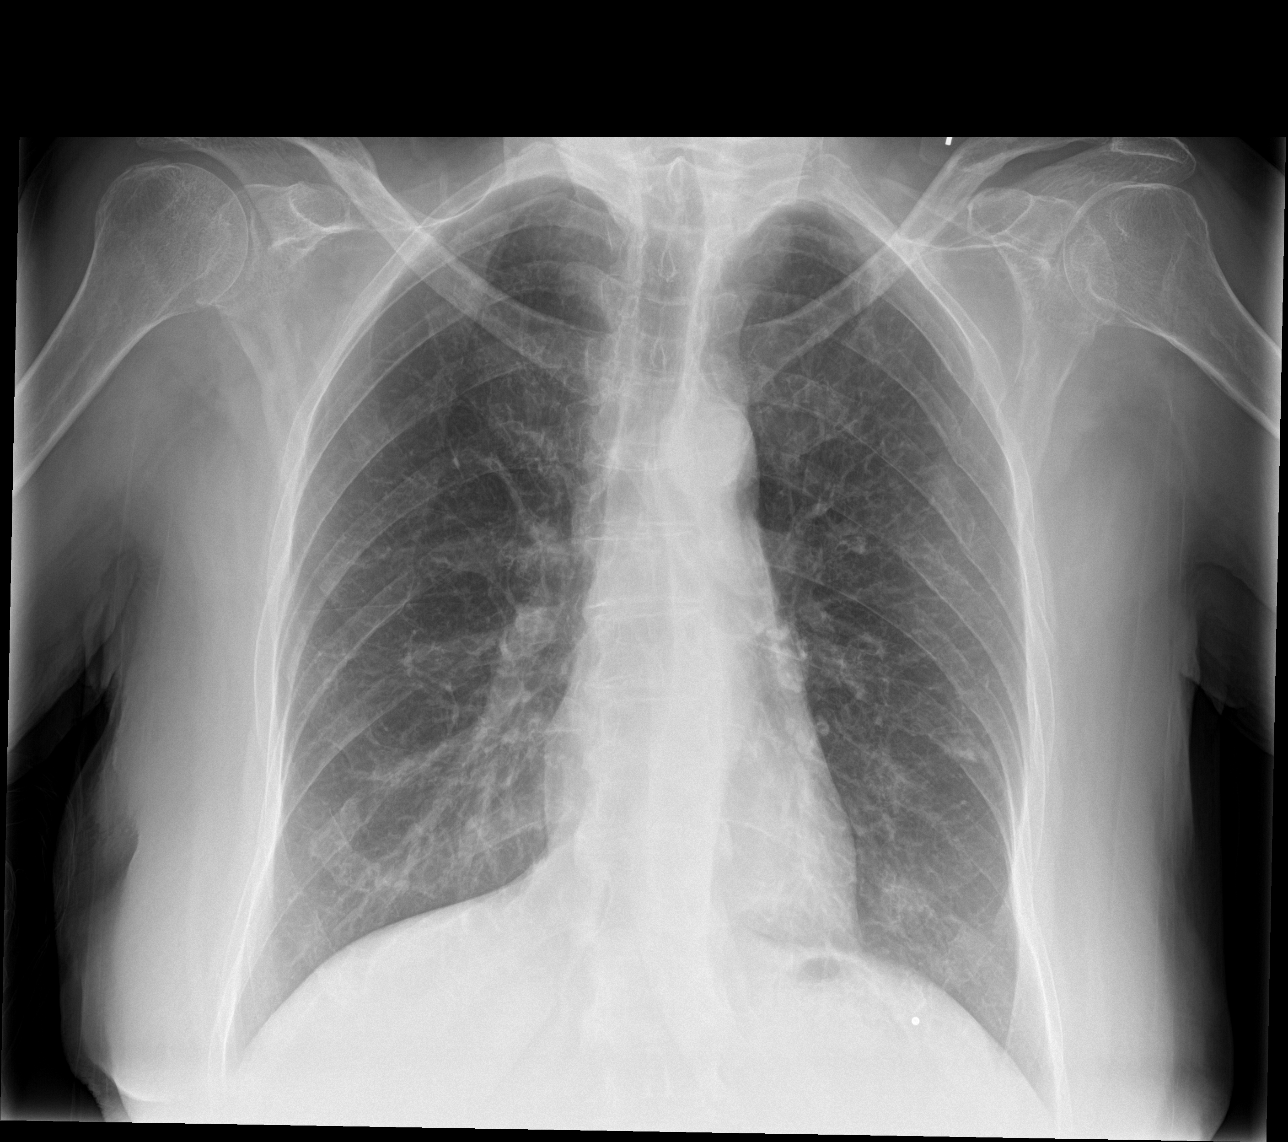

[Series 3: rib pa obl · 0.14mm/px · 2 of 2 slices shown]
[im 1/2]
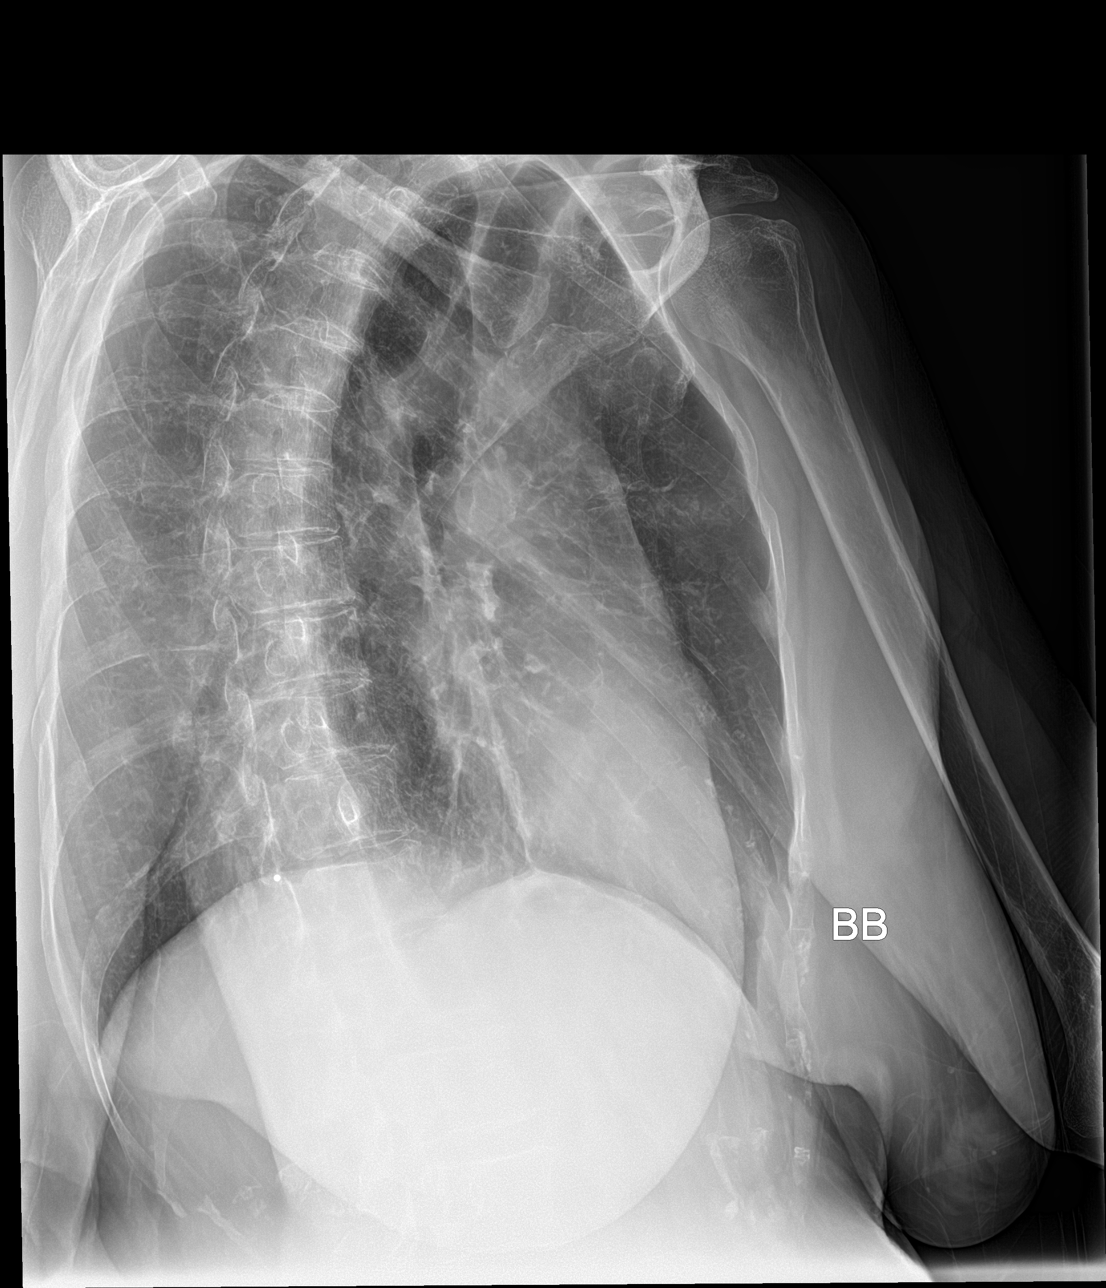
[im 2/2]
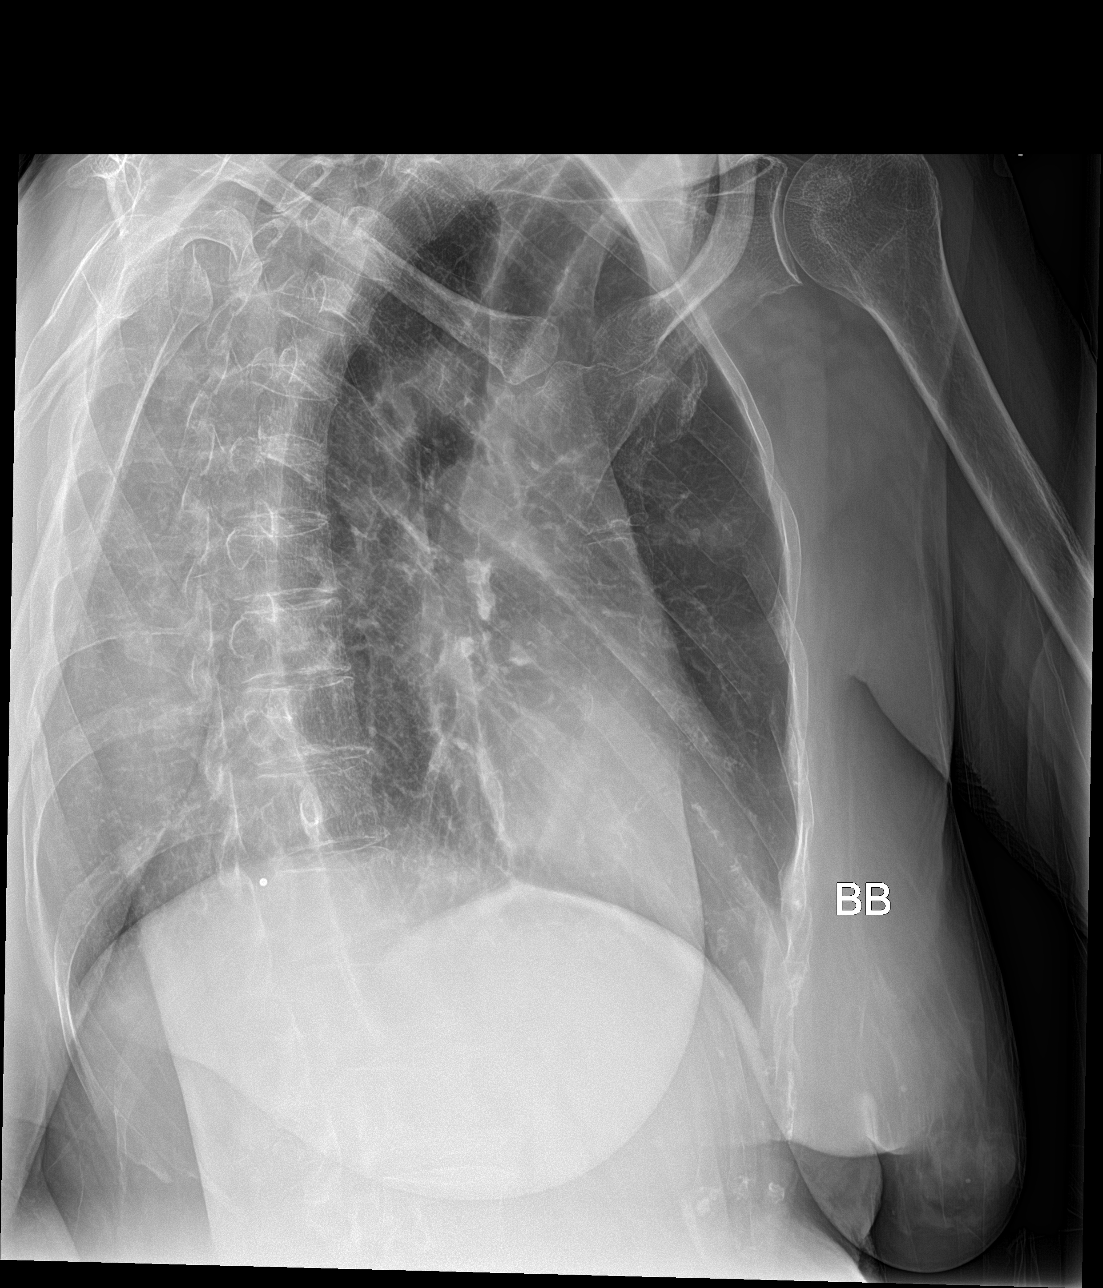

[rib ap]
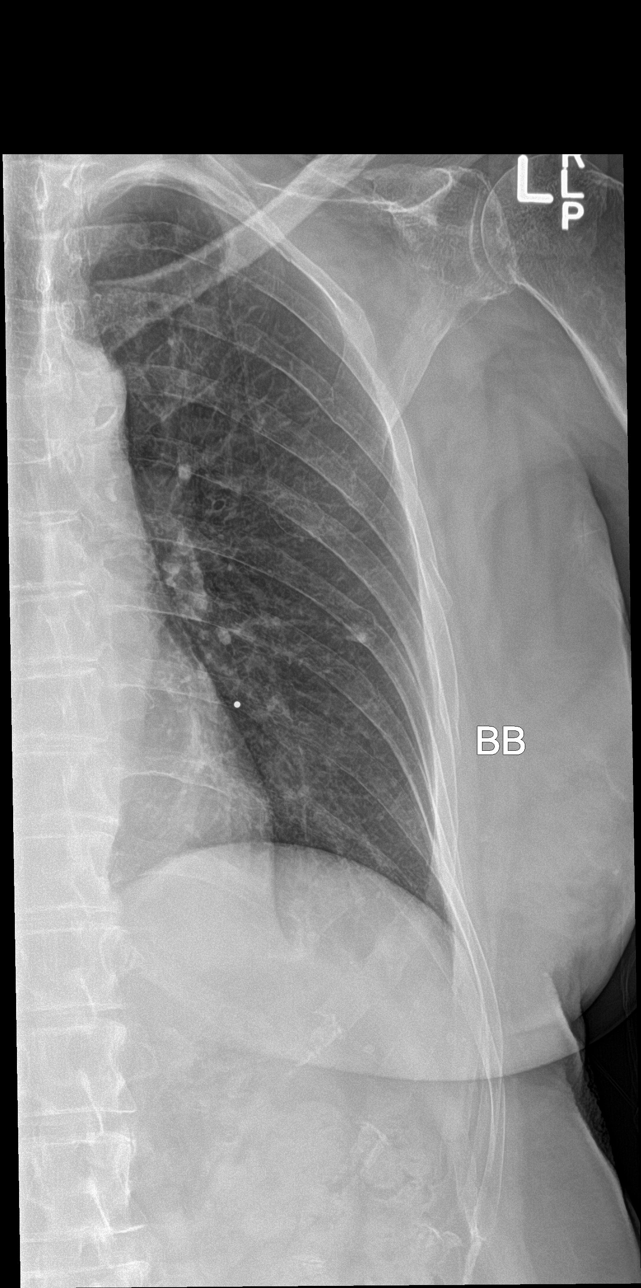

[4 of 4 positions shown; findings below may reference images not displayed]

FINDINGS: There are displaced fractures of the left fourth, fifth, sixth,
seventh ribs. There questioned fractures of the left second and
third ribs. There is no pneumothorax. The lungs are clear. The
mediastinal contour and cardiac silhouette are normal.
IMPRESSION: Fractures of left ribs as described.

## 2017-01-13 IMAGING — CT CT HEAD W/O CM
2 series · 13 of 30 positions shown, 15 images · non-contrast
Comparison: Head CT 01/17/2015

CLINICAL DATA: Intractable vomiting over the last few days.

EXAM:
CT HEAD WITHOUT CONTRAST
TECHNIQUE: Contiguous axial images were obtained from the base of the skull
through the vertex without intravenous contrast.

[Series 2: head wo · axial · 0.43mm/px · z∈[-130,-27]mm · 5 of 35 slices shown, 7 images]
[im 6/35  brain]
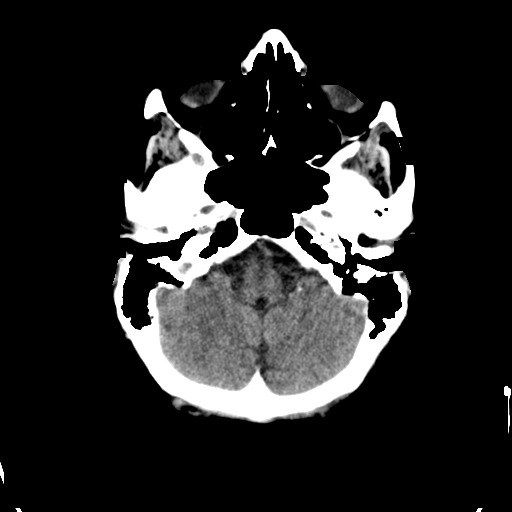
[im 6/35  bone]
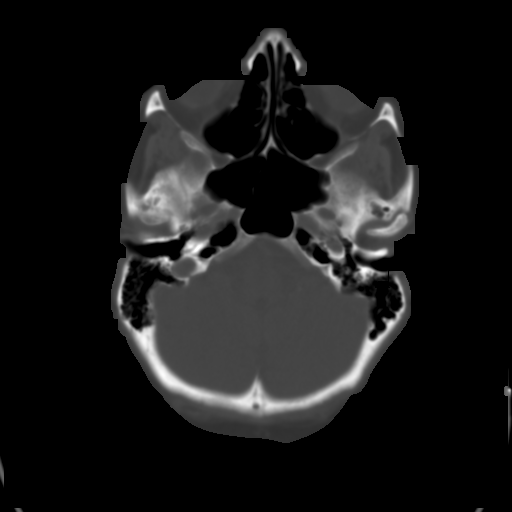
[im 12/35  brain]
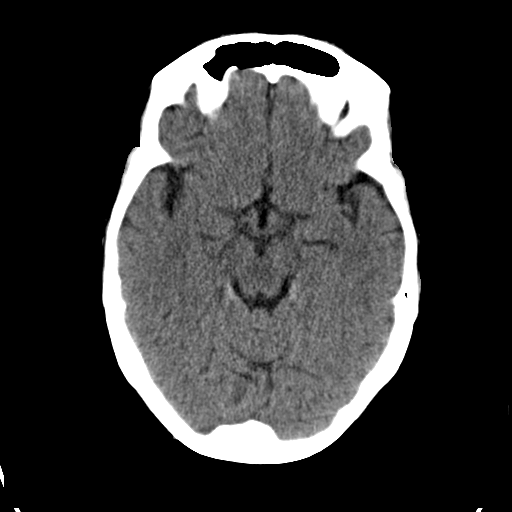
[im 18/35  brain]
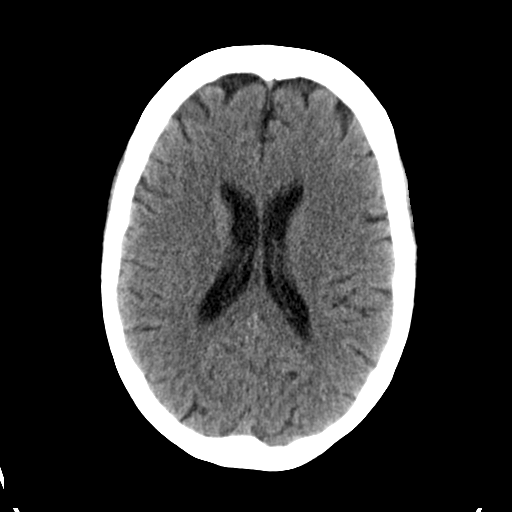
[im 23/35  brain]
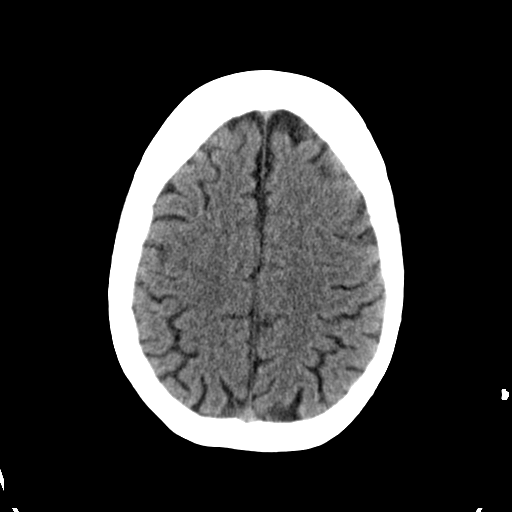
[im 29/35  brain]
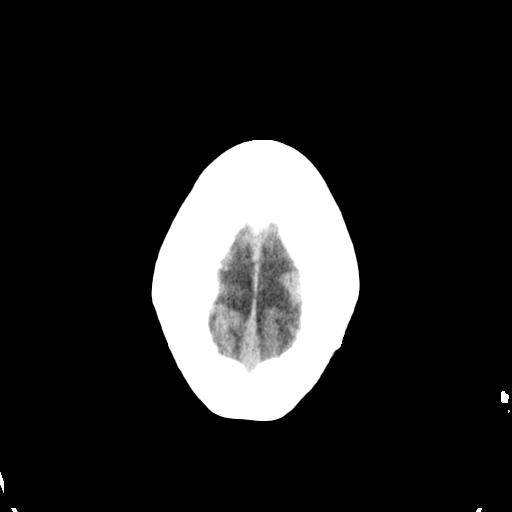
[im 29/35  bone]
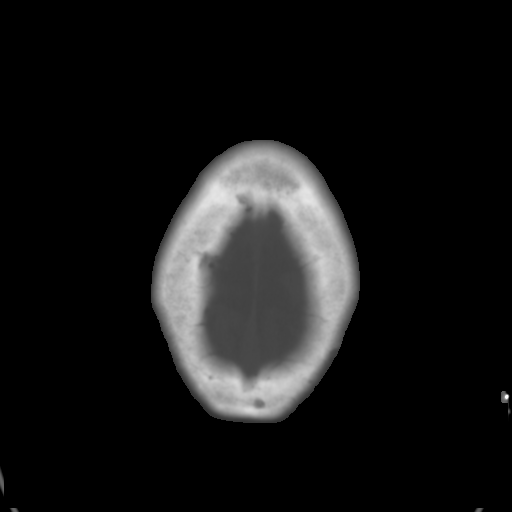

[Series 3: head bone · axial · 0.43mm/px · z∈[-141,-9]mm · 8 of 108 slices shown]
[im 10/108  bone]
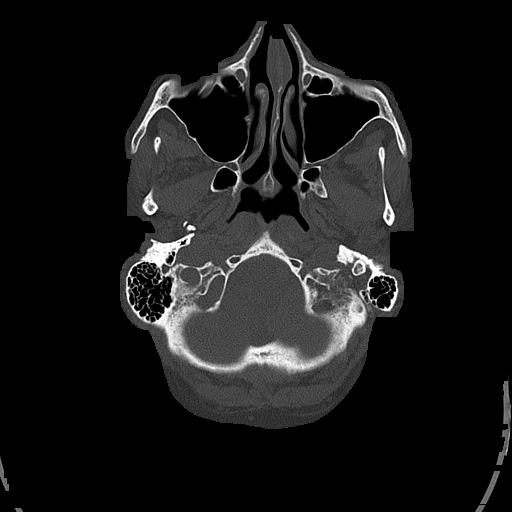
[im 20/108  bone]
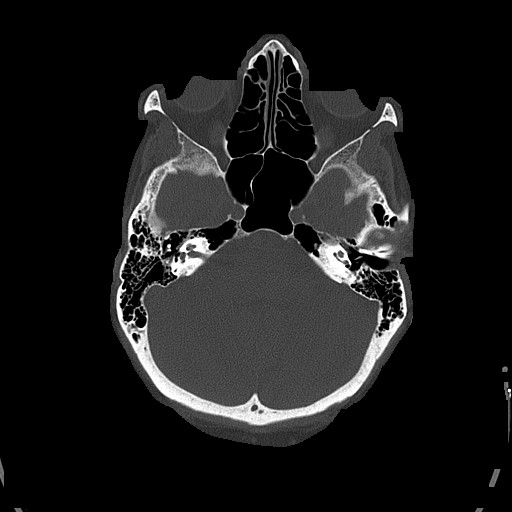
[im 35/108  bone]
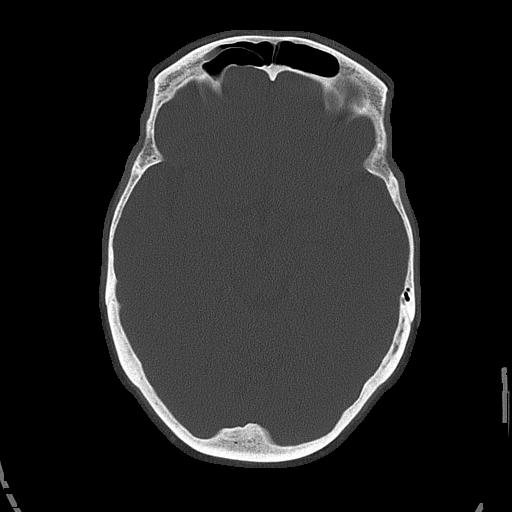
[im 49/108  bone]
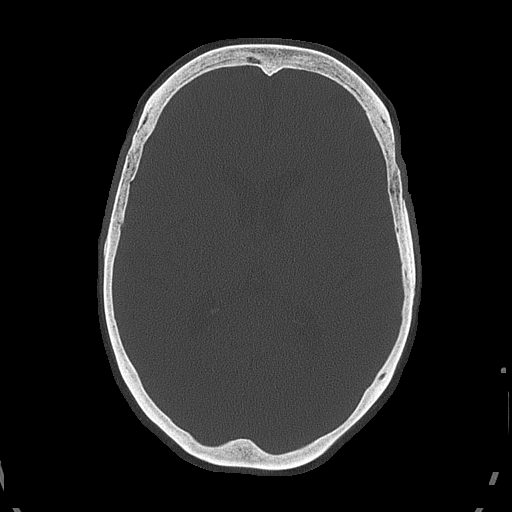
[im 59/108  bone]
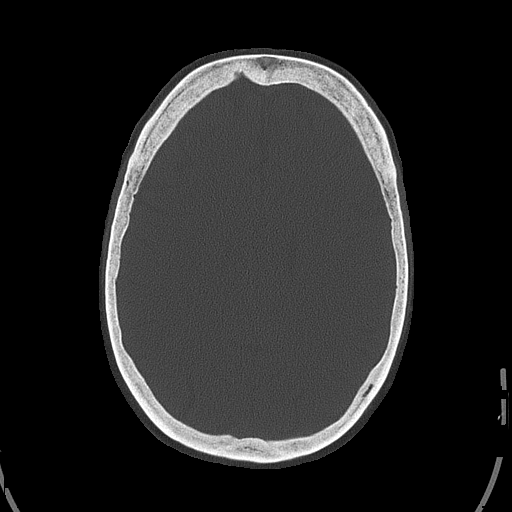
[im 73/108  bone]
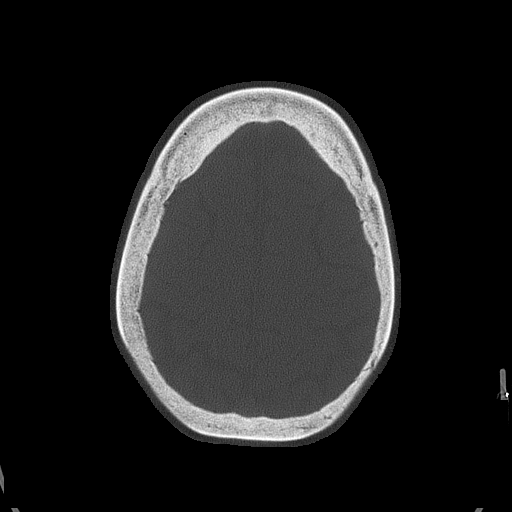
[im 88/108  bone]
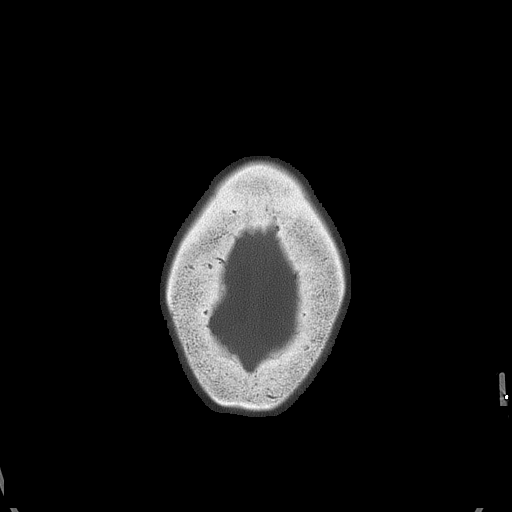
[im 98/108  bone]
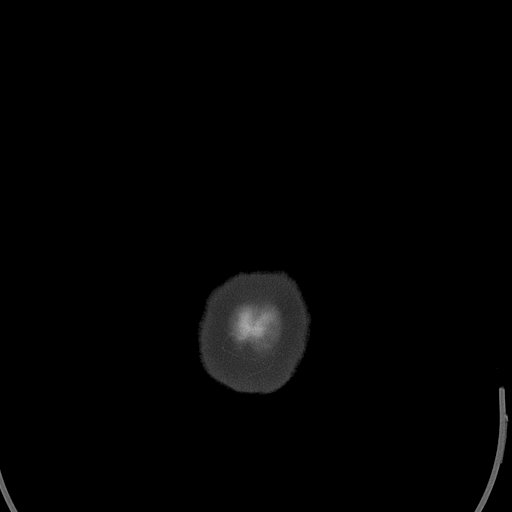

[13 of 30 positions shown; findings below may reference images not displayed]

FINDINGS: Brainstem is normal. Chronically known 1 cm lesion of the left
cerebellum is again visible, consistent with a chronic cavernous
angioma. Some adjacent brain low density is present as has been seen
previously. No sign that there is any active process in this region.
The cerebral hemispheres are normal. No evidence of old or acute
infarction, mass lesion, hemorrhage, hydrocephalus or extra-axial
collection. Chronic benign left calvarial lucency likely related to
a hemangioma. No acute calvarial finding. Sinuses, middle ears and
mastoids are clear.
IMPRESSION: No acute intracranial finding. Chronically known left cerebellar
lesion felt to represent a cavernous angioma. Based on the unchanged
appearance, this would be on likely to relate to the clinical
presentation.
